# Patient Record
Sex: Female | Born: 1935
Health system: Southern US, Community
[De-identification: ages and names within clinical notes are randomized; demographics above are authoritative.]

## PROBLEM LIST (undated history)

## (undated) DIAGNOSIS — J309 Allergic rhinitis, unspecified: Secondary | ICD-10-CM

## (undated) DIAGNOSIS — E7800 Pure hypercholesterolemia, unspecified: Secondary | ICD-10-CM

## (undated) DIAGNOSIS — I2510 Atherosclerotic heart disease of native coronary artery without angina pectoris: Secondary | ICD-10-CM

## (undated) DIAGNOSIS — Z889 Allergy status to unspecified drugs, medicaments and biological substances status: Secondary | ICD-10-CM

## (undated) DIAGNOSIS — M1990 Unspecified osteoarthritis, unspecified site: Secondary | ICD-10-CM

## (undated) DIAGNOSIS — M419 Scoliosis, unspecified: Secondary | ICD-10-CM

## (undated) DIAGNOSIS — E785 Hyperlipidemia, unspecified: Secondary | ICD-10-CM

## (undated) DIAGNOSIS — IMO0001 Reserved for inherently not codable concepts without codable children: Secondary | ICD-10-CM

## (undated) DIAGNOSIS — E242 Drug-induced Cushing's syndrome: Secondary | ICD-10-CM

## (undated) DIAGNOSIS — I10 Essential (primary) hypertension: Secondary | ICD-10-CM

## (undated) HISTORY — DX: Atherosclerotic heart disease of native coronary artery without angina pectoris: I25.10

## (undated) HISTORY — DX: Reserved for inherently not codable concepts without codable children: IMO0001

## (undated) HISTORY — DX: Allergic rhinitis, unspecified: J30.9

## (undated) HISTORY — PX: ABDOMINAL HYSTERECTOMY: SHX81

## (undated) HISTORY — DX: Hyperlipidemia, unspecified: E78.5

## (undated) HISTORY — DX: Drug-induced Cushing's syndrome: E24.2

## (undated) HISTORY — DX: Essential (primary) hypertension: I10

## (undated) HISTORY — DX: Pure hypercholesterolemia, unspecified: E78.00

## (undated) HISTORY — DX: Unspecified osteoarthritis, unspecified site: M19.90

## (undated) HISTORY — DX: Allergy status to unspecified drugs, medicaments and biological substances: Z88.9

## (undated) HISTORY — DX: Scoliosis, unspecified: M41.9

---
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
## 1958-09-15 HISTORY — PX: APPENDECTOMY: SHX54

## 1993-09-15 HISTORY — PX: OTHER SURGICAL HISTORY: SHX169

## 1995-09-16 HISTORY — PX: OTHER SURGICAL HISTORY: SHX169

## 1996-09-15 HISTORY — PX: REDUCTION MAMMAPLASTY: SUR839

## 1998-06-21 ENCOUNTER — Ambulatory Visit (HOSPITAL_COMMUNITY): Admission: RE | Admit: 1998-06-21 | Discharge: 1998-06-21 | Payer: Self-pay | Admitting: Neurological Surgery

## 1998-12-10 ENCOUNTER — Encounter: Admission: RE | Admit: 1998-12-10 | Discharge: 1998-12-26 | Payer: Self-pay | Admitting: Neurological Surgery

## 1998-12-19 ENCOUNTER — Ambulatory Visit (HOSPITAL_COMMUNITY): Admission: RE | Admit: 1998-12-19 | Discharge: 1998-12-19 | Payer: Self-pay | Admitting: Pediatrics

## 1998-12-19 ENCOUNTER — Encounter: Payer: Self-pay | Admitting: Neurological Surgery

## 1999-08-24 ENCOUNTER — Ambulatory Visit (HOSPITAL_COMMUNITY): Admission: RE | Admit: 1999-08-24 | Discharge: 1999-08-24 | Payer: Self-pay | Admitting: Neurological Surgery

## 1999-08-26 ENCOUNTER — Encounter: Payer: Self-pay | Admitting: Neurological Surgery

## 1999-12-04 ENCOUNTER — Encounter: Admission: RE | Admit: 1999-12-04 | Discharge: 2000-03-03 | Payer: Self-pay | Admitting: Anesthesiology

## 2000-03-30 ENCOUNTER — Encounter: Admission: RE | Admit: 2000-03-30 | Discharge: 2000-06-28 | Payer: Self-pay | Admitting: Anesthesiology

## 2000-08-12 ENCOUNTER — Encounter: Admission: RE | Admit: 2000-08-12 | Discharge: 2000-11-10 | Payer: Self-pay | Admitting: Anesthesiology

## 2000-09-01 ENCOUNTER — Encounter: Payer: Self-pay | Admitting: Neurological Surgery

## 2000-09-01 ENCOUNTER — Ambulatory Visit (HOSPITAL_COMMUNITY): Admission: RE | Admit: 2000-09-01 | Discharge: 2000-09-01 | Payer: Self-pay | Admitting: Neurological Surgery

## 2000-11-19 ENCOUNTER — Encounter: Admission: RE | Admit: 2000-11-19 | Discharge: 2001-02-17 | Payer: Self-pay | Admitting: Anesthesiology

## 2000-12-03 ENCOUNTER — Ambulatory Visit (HOSPITAL_COMMUNITY): Admission: RE | Admit: 2000-12-03 | Discharge: 2000-12-03 | Payer: Self-pay | Admitting: Gastroenterology

## 2001-03-16 ENCOUNTER — Encounter: Admission: RE | Admit: 2001-03-16 | Discharge: 2001-05-15 | Payer: Self-pay | Admitting: Anesthesiology

## 2002-11-04 ENCOUNTER — Emergency Department (HOSPITAL_COMMUNITY): Admission: EM | Admit: 2002-11-04 | Discharge: 2002-11-04 | Payer: Self-pay | Admitting: Emergency Medicine

## 2004-11-21 ENCOUNTER — Inpatient Hospital Stay (HOSPITAL_COMMUNITY): Admission: EM | Admit: 2004-11-21 | Discharge: 2004-11-22 | Payer: Self-pay | Admitting: Emergency Medicine

## 2004-12-17 ENCOUNTER — Encounter: Admission: RE | Admit: 2004-12-17 | Discharge: 2004-12-17 | Payer: Self-pay | Admitting: Cardiology

## 2005-08-11 ENCOUNTER — Emergency Department (HOSPITAL_COMMUNITY): Admission: EM | Admit: 2005-08-11 | Discharge: 2005-08-12 | Payer: Self-pay | Admitting: Emergency Medicine

## 2005-12-31 ENCOUNTER — Encounter: Admission: RE | Admit: 2005-12-31 | Discharge: 2005-12-31 | Payer: Self-pay | Admitting: Cardiology

## 2006-09-15 HISTORY — PX: CORONARY ARTERY BYPASS GRAFT: SHX141

## 2006-09-21 ENCOUNTER — Ambulatory Visit (HOSPITAL_COMMUNITY): Admission: RE | Admit: 2006-09-21 | Discharge: 2006-09-21 | Payer: Self-pay | Admitting: Cardiovascular Disease

## 2006-11-14 HISTORY — PX: CORONARY ANGIOPLASTY: SHX604

## 2006-11-14 HISTORY — PX: CARDIAC CATHETERIZATION: SHX172

## 2007-01-07 ENCOUNTER — Encounter (HOSPITAL_COMMUNITY): Admission: RE | Admit: 2007-01-07 | Discharge: 2007-04-07 | Payer: Self-pay | Admitting: Cardiology

## 2007-08-11 ENCOUNTER — Encounter: Admission: RE | Admit: 2007-08-11 | Discharge: 2007-08-11 | Payer: Self-pay | Admitting: Cardiology

## 2008-02-14 ENCOUNTER — Encounter: Admission: RE | Admit: 2008-02-14 | Discharge: 2008-02-14 | Payer: Self-pay | Admitting: Orthopedic Surgery

## 2008-04-26 ENCOUNTER — Encounter: Admission: RE | Admit: 2008-04-26 | Discharge: 2008-04-26 | Payer: Self-pay | Admitting: Orthopaedic Surgery

## 2008-09-20 ENCOUNTER — Encounter: Admission: RE | Admit: 2008-09-20 | Discharge: 2008-09-20 | Payer: Self-pay | Admitting: Cardiology

## 2008-09-22 ENCOUNTER — Encounter: Admission: RE | Admit: 2008-09-22 | Discharge: 2008-09-22 | Payer: Self-pay | Admitting: Cardiology

## 2009-03-27 ENCOUNTER — Encounter: Admission: RE | Admit: 2009-03-27 | Discharge: 2009-03-27 | Payer: Self-pay | Admitting: Cardiology

## 2009-03-28 ENCOUNTER — Encounter: Admission: RE | Admit: 2009-03-28 | Discharge: 2009-03-28 | Payer: Self-pay | Admitting: Anesthesiology

## 2009-06-22 ENCOUNTER — Encounter: Admission: RE | Admit: 2009-06-22 | Discharge: 2009-06-22 | Payer: Self-pay | Admitting: Cardiology

## 2009-06-27 ENCOUNTER — Encounter: Admission: RE | Admit: 2009-06-27 | Discharge: 2009-06-27 | Payer: Self-pay | Admitting: Cardiology

## 2009-09-15 HISTORY — PX: CHOLECYSTECTOMY: SHX55

## 2010-03-14 ENCOUNTER — Encounter: Admission: RE | Admit: 2010-03-14 | Discharge: 2010-03-14 | Payer: Self-pay | Admitting: Cardiology

## 2010-05-02 ENCOUNTER — Encounter: Admission: RE | Admit: 2010-05-02 | Discharge: 2010-05-02 | Payer: Self-pay | Admitting: Gastroenterology

## 2010-05-27 ENCOUNTER — Encounter (INDEPENDENT_AMBULATORY_CARE_PROVIDER_SITE_OTHER): Payer: Self-pay | Admitting: General Surgery

## 2010-05-27 ENCOUNTER — Observation Stay (HOSPITAL_COMMUNITY): Admission: RE | Admit: 2010-05-27 | Discharge: 2010-05-28 | Payer: Self-pay | Admitting: General Surgery

## 2010-05-27 HISTORY — PX: CHOLECYSTECTOMY, LAPAROSCOPIC: SHX56

## 2010-06-07 ENCOUNTER — Emergency Department (HOSPITAL_COMMUNITY): Admission: EM | Admit: 2010-06-07 | Discharge: 2010-06-07 | Payer: Self-pay | Admitting: Emergency Medicine

## 2010-07-10 ENCOUNTER — Ambulatory Visit: Payer: Self-pay | Admitting: Cardiovascular Disease

## 2010-07-15 ENCOUNTER — Ambulatory Visit: Payer: Self-pay | Admitting: Cardiology

## 2010-09-15 HISTORY — PX: TOTAL KNEE ARTHROPLASTY: SHX125

## 2010-09-17 ENCOUNTER — Telehealth (INDEPENDENT_AMBULATORY_CARE_PROVIDER_SITE_OTHER): Payer: Self-pay | Admitting: *Deleted

## 2010-09-18 ENCOUNTER — Encounter: Payer: Self-pay | Admitting: Cardiology

## 2010-09-18 ENCOUNTER — Encounter (HOSPITAL_COMMUNITY)
Admission: RE | Admit: 2010-09-18 | Discharge: 2010-10-15 | Payer: Self-pay | Source: Home / Self Care | Attending: Cardiology | Admitting: Cardiology

## 2010-09-18 ENCOUNTER — Ambulatory Visit: Admission: RE | Admit: 2010-09-18 | Discharge: 2010-09-18 | Payer: Self-pay | Source: Home / Self Care

## 2010-09-18 ENCOUNTER — Encounter: Payer: Self-pay | Admitting: *Deleted

## 2010-09-19 HISTORY — PX: CARDIOVASCULAR STRESS TEST: SHX262

## 2010-10-06 ENCOUNTER — Encounter: Payer: Self-pay | Admitting: Cardiology

## 2010-10-09 ENCOUNTER — Other Ambulatory Visit: Payer: Self-pay | Admitting: Orthopedic Surgery

## 2010-10-09 LAB — PROTIME-INR
INR: 0.97 (ref 0.00–1.49)
Prothrombin Time: 13.1 seconds (ref 11.6–15.2)

## 2010-10-09 LAB — URINALYSIS, ROUTINE W REFLEX MICROSCOPIC
Ketones, ur: NEGATIVE mg/dL
Nitrite: NEGATIVE
Specific Gravity, Urine: 1.009 (ref 1.005–1.030)
pH: 7.5 (ref 5.0–8.0)

## 2010-10-09 LAB — COMPREHENSIVE METABOLIC PANEL
ALT: 13 U/L (ref 0–35)
AST: 14 U/L (ref 0–37)
Albumin: 3.2 g/dL — ABNORMAL LOW (ref 3.5–5.2)
CO2: 29 mEq/L (ref 19–32)
Chloride: 93 mEq/L — ABNORMAL LOW (ref 96–112)
GFR calc non Af Amer: >60 mL/min (ref >60)
Glucose, Bld: 140 mg/dL — ABNORMAL HIGH (ref 70–99)
Total Bilirubin: 0.5 mg/dL (ref 0.3–1.2)
Total Protein: 6.9 g/dL (ref 6.0–8.3)

## 2010-10-09 LAB — CBC
MCH: 30.6 pg (ref 26.0–34.0)
RDW: 14.1 % (ref 11.5–15.5)

## 2010-10-09 LAB — SURGICAL PCR SCREEN
MRSA, PCR: NEGATIVE
Staphylococcus aureus: NEGATIVE

## 2010-10-09 LAB — APTT: aPTT: 28 seconds (ref 24–37)

## 2010-10-14 ENCOUNTER — Inpatient Hospital Stay (HOSPITAL_COMMUNITY)
Admission: RE | Admit: 2010-10-14 | Discharge: 2010-10-17 | DRG: 470 | Disposition: A | Discharge status: Home / Self Care | Payer: Medicare Other | Attending: Orthopedic Surgery | Admitting: Orthopedic Surgery

## 2010-10-14 DIAGNOSIS — IMO0002 Reserved for concepts with insufficient information to code with codable children: Secondary | ICD-10-CM

## 2010-10-14 DIAGNOSIS — M4120 Other idiopathic scoliosis, site unspecified: Secondary | ICD-10-CM | POA: Diagnosis present

## 2010-10-14 DIAGNOSIS — L508 Other urticaria: Secondary | ICD-10-CM | POA: Diagnosis not present

## 2010-10-14 DIAGNOSIS — T380X5A Adverse effect of glucocorticoids and synthetic analogues, initial encounter: Secondary | ICD-10-CM | POA: Diagnosis present

## 2010-10-14 DIAGNOSIS — I10 Essential (primary) hypertension: Secondary | ICD-10-CM | POA: Diagnosis present

## 2010-10-14 DIAGNOSIS — L299 Pruritus, unspecified: Secondary | ICD-10-CM | POA: Diagnosis not present

## 2010-10-14 DIAGNOSIS — E871 Hypo-osmolality and hyponatremia: Secondary | ICD-10-CM | POA: Diagnosis present

## 2010-10-14 DIAGNOSIS — E669 Obesity, unspecified: Secondary | ICD-10-CM | POA: Diagnosis present

## 2010-10-14 DIAGNOSIS — D72829 Elevated white blood cell count, unspecified: Secondary | ICD-10-CM | POA: Diagnosis present

## 2010-10-14 DIAGNOSIS — Z951 Presence of aortocoronary bypass graft: Secondary | ICD-10-CM

## 2010-10-14 DIAGNOSIS — I2510 Atherosclerotic heart disease of native coronary artery without angina pectoris: Secondary | ICD-10-CM | POA: Diagnosis present

## 2010-10-14 DIAGNOSIS — R7309 Other abnormal glucose: Secondary | ICD-10-CM | POA: Diagnosis present

## 2010-10-14 DIAGNOSIS — E876 Hypokalemia: Secondary | ICD-10-CM | POA: Diagnosis not present

## 2010-10-14 DIAGNOSIS — M1710 Unilateral primary osteoarthritis, unspecified knee: Principal | ICD-10-CM | POA: Diagnosis present

## 2010-10-14 DIAGNOSIS — K449 Diaphragmatic hernia without obstruction or gangrene: Secondary | ICD-10-CM | POA: Diagnosis present

## 2010-10-14 DIAGNOSIS — E2749 Other adrenocortical insufficiency: Secondary | ICD-10-CM | POA: Diagnosis present

## 2010-10-14 DIAGNOSIS — E249 Cushing's syndrome, unspecified: Secondary | ICD-10-CM | POA: Diagnosis present

## 2010-10-14 DIAGNOSIS — E785 Hyperlipidemia, unspecified: Secondary | ICD-10-CM | POA: Diagnosis present

## 2010-10-15 LAB — BASIC METABOLIC PANEL
BUN: 9 mg/dL (ref 6–23)
CO2: 29 mEq/L (ref 19–32)
Creatinine, Ser: 0.75 mg/dL (ref 0.4–1.2)
GFR calc Af Amer: >60 mL/min (ref >60)
GFR calc non Af Amer: >60 mL/min (ref >60)

## 2010-10-15 LAB — CBC
MCV: 89.8 fL (ref 78.0–100.0)
RBC: 4 MIL/uL (ref 3.87–5.11)
RDW: 14.1 % (ref 11.5–15.5)
WBC: 17.4 10*3/uL — ABNORMAL HIGH (ref 4.0–10.5)

## 2010-10-15 LAB — PROTIME-INR: INR: 1.03 (ref 0.00–1.49)

## 2010-10-16 LAB — BASIC METABOLIC PANEL
CO2: 27 mEq/L (ref 19–32)
Chloride: 95 mEq/L — ABNORMAL LOW (ref 96–112)
GFR calc Af Amer: >60 mL/min (ref >60)
GFR calc non Af Amer: >60 mL/min (ref >60)
Glucose, Bld: 211 mg/dL — ABNORMAL HIGH (ref 70–99)

## 2010-10-16 LAB — C-REACTIVE PROTEIN: CRP: 12.1 mg/dL — ABNORMAL HIGH

## 2010-10-16 LAB — SEDIMENTATION RATE: Sed Rate: 45 mm/hr — ABNORMAL HIGH (ref 0–22)

## 2010-10-16 NOTE — Op Note (Signed)
Vanessa Nielsen, Vanessa Nielsen             ACCOUNT NO.:  [id number]  MEDICAL RECORD NO.:  [id number]          PATIENT TYPE:  INP  LOCATION:  0010                         FACILITY:  [facility]  PHYSICIAN:  Ollen Gross, M.D.    DATE OF BIRTH:  07-16-1934  DATE OF PROCEDURE: DATE OF DISCHARGE:                              OPERATIVE REPORT   PREOPERATIVE DIAGNOSIS:  Osteoarthritis, right knee.  POSTOPERATIVE DIAGNOSIS:  Osteoarthritis, right knee.  PROCEDURE:  Right total knee arthroplasty.  SURGEON:  Ollen Gross, M.D.  ASSISTANT:  Avel Peace, PA-C  ANESTHESIA:  General.  ESTIMATED BLOOD LOSS:  Minimal.  DRAINS:  Hemovac times one.  TOURNIQUET TIME:  34 minutes at 300 mmHg.  COMPLICATIONS:  None.  CONDITION:  Stable to recovery room.  BRIEF CLINICAL NOTE:  Vanessa Nielsen is a 77 year old female with advanced arthritis of the right knee with progressively worsening pain and dysfunction.  She has failed nonoperative management and presents for a right total knee arthroplasty.  PROCEDURE IN DETAIL:  After successful administration of general anesthetic, a tourniquet was placed on her right thigh, and her right lower extremity was prepped and draped in the usual sterile fashion. Extremities wrapped in Esmarch, knee flexed, tourniquet inflated to 300 mmHg.  Midline incision was made with a 10 blade through subcutaneous tissue to the level of the extensor mechanism.  A fresh blade was used to make a medial parapatellar arthrotomy.  Soft tissue on the proximal medial tibia subperiosteally elevated to the joint line with the knife into the semimembranosus bursa with a Cobb elevator.  Soft tissue laterally was elevated with attention being paid to avoiding the patellar tendon on tibial tubercle.  Patella was everted, knee flexed 90 degrees, and ACL and PCL removed.  Drill was used to create a starting hole in the distal femur and the canal was thoroughly irrigated.  The 5- degree right  valgus alignment guide was placed, and a distal femoral cutting block was pinned to remove 10 mm off the distal femur.  Distal femoral resection was made with an oscillating saw.  The tibia subluxed forward, and the menisci are removed.  Extramedullary tibial alignment guide was placed referencing proximally at the medial aspect of the tibial tubercle and distally along the second metatarsal axis and tibial crest.  The block was pinned to remove 2 mm off the more deficient medial side.  Tibial resection was made with an oscillating saw.  Size 2.5 was the most appropriate tibial component, and the proximal tibia was prepared with the modular drill and keel punch for the size 2.5.  Femoral sizing guide was placed.  Size 3 was the most appropriate femur. Rotations marked off the epicondylar axis and confirmed by creating a rectangular flexion gap at 90 degrees.  Block was pinned in this rotation and the anterior-posterior and chamfer cuts were made. Intercondylar blocks placed and that cut was made.  The trial size 3 posterior stabilized femur was placed.  A 10-mm posterior stabilized rotating platform insert trial was placed.  There was a little play with the 10, so I went to 12.5, which allowed for full extension with excellent  varus-valgus and anterior-posterior balance throughout full range of motion.  The patella was everted and thickness measured to be 23 mm.  Freehand resection taking 14 mm, 35 templates placed, lug holes drilled, trial patella was placed and it tracked normally.  Osteophytes were removed off the posterior femur with the trial in place.  All trials were removed, and the cut bone surfaces were prepared with pulsatile lavage.  Cement was mixed, and once ready for implantation, the size 2.5 mobile bearing tibial tray size 3 posterior stabilized femur and 35 patella were cemented into place, and the patella was held with a clamp.  Trial 12.5-mm inserts placed, knee held  in full extension, and all extruded cement removed.  When the cement fully hardened, then the permanent 12.5 mm posterior stabilized rotating platform insert was placed in the tibial tray.  The wound was copiously irrigated with saline solution, and the arthrotomy closed over Hemovac drain with interrupted #1 PDS.  Flexion against gravity was about 135 degrees, and the patella tracked normally.  The tourniquet was released with a total time of 34 minutes.  Subcutaneous was closed with interrupted 2-0 Vicryl and subcuticular with running 4-0 Monocryl. Catheter for Marcaine pain pump was placed, and the pump was initiated. Incisions cleaned and dried, and Steri-Strips and a bulky sterile dressing were applied.  She was then placed into a knee immobilizer, awakened and transferred to recovery in stable condition.     Ollen Gross, M.D.     FA/MEDQ  D:  10/14/2010  T:  10/14/2010  Job:  [id number]  Electronically Signed by Ollen Gross M.D. on 10/16/2010 03:40:33 PM

## 2010-10-16 NOTE — H&P (Signed)
Vanessa Nielsen, Vanessa Nielsen             ACCOUNT NO.:  [id number]  MEDICAL RECORD NO.:  [id number]          PATIENT TYPE:  INP  LOCATION:  NA                           FACILITY:  [facility]  PHYSICIAN:  Ollen Gross, M.D.    DATE OF BIRTH:  May 12, 1934  DATE OF ADMISSION:  10/14/2010 DATE OF DISCHARGE:                             HISTORY & PHYSICAL   CHIEF COMPLAINT:  Right knee pain.  HISTORY OF PRESENT ILLNESS:  The patient is 77 year old female who has been seen and evaluated for her right knee.  She has had multiple problems for ongoing years now.  She is in a complex situation.  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  This a right lower extremity with weightbearing for many years now.  She has a stage now where the knee is hurting so bad that she is unable to walk. She has been wheelchair-bound for quite some time now with increasing pain.  Pain is in the joint and radiates down to the leg.  She is seen in the office where x-rays show advanced arthritis in the patellofemoral compartment.  She is bone-on-bone.  Due to the advanced end-stage arthritis, it is felt she would benefit undergoing surgical intervention.  She has significant back problems and actually responded very well to SI joints from Dr. Noel Gerold a couple years ago, although it is felt lot of her pain is coming from her knee and would benefit from surgery.  Risk and benefits have been discussed with her.  She elects to proceed with surgery.  ALLERGIES:  NO KNOWN DOCUMENTED ALLERGIES.  HOWEVER, THE PATIENT HAS HAD, WITH ANY TYPE OF ANESTHETIC AGENT, DEVELOPED SEVERE REACTION WITH SWELLING IN HER MOUTH AND GI TRACK UPSET WITH RED WELTS IN HER HANDS, ARMS, AND ALSO HYPOTENSION.  SHE HAS NEVER HAD ANY DIFFICULTY BREATHING OR STOPPED BREATHING FROM THIS BUT HAS ALWAYS RESPONDED TO STEROIDS IN HER HISTORY.  SHE DOES HAVE AN ALLERGIC REACTION TO PAPER DOCUMENTING HER SYMPTOMS AND ALSO PREVIOUS  TREATMENTS WHICH SHE HAS RESPONDED TO.  CURRENT MEDICATIONS: 1. Lovaza 1 gram daily. 2. Lopressor 50 mg twice a day. 3. Hyzaar 100/25 daily. 4. Micro-K 10 mEq three times day. 5. Norvasc 5 mg daily. 6. Lipitor 10 mg daily. 7. Duragesic patches change every 2 days. 8. Prednisone 10 mg daily. 9. Valium 5 mg as needed. 10.Benadryl 25 mg as needed. 11.She also takes over-the-counter stool softeners, over-the-counter     Ex-Lax, occasional MiraLax, occasional Gas-X, Align, and low-dose     aspirin.  PAST MEDICAL HISTORY: 1. Hypertension. 2. Hypercholesterolemia. 3. Coronary arterial disease. 4. Iatrogenic Cushing's syndrome. 5. Scoliosis. 6. Left lower extremity incomplete paralysis giving her a left foot     drop, although she does have good quad control and lower extremity     weakness. 7. Hiatal hernia. 8. History of gastritis.  PAST SURGICAL HISTORY: 1. CABG of one vessel in 2008 done at [facility]. 2. She has had no multilevel lumbar fusion for scoliosis deformity     done at [facility] in 1995. 3. She had removal of hardware in 1997 due to loose  screws. 4. She has had multiple right knee surgeries, secondary to an     epiphyseal arrest with staples and then removal.  FAMILY HISTORY:  Colon cancer and hypertension.  SOCIAL HISTORY:  She is a retired Health and safety inspector, married, two children. Denies tobacco products.  No alcohol products.  REVIEW OF SYSTEMS:  GENERAL:  No fevers, chills, night sweats.  NEURO: Left lower extremity incomplete paralysis.  No seizures or syncope. RESPIRATORY:  She has a little shortness of breath on exertion.  No shortness breath at rest.  No productive cough or hemoptysis. CARDIOVASCULAR:  Chest pain and orthopnea ever since her bypass surgery. GI: Occasional constipation and nausea.  No diarrhea.  No vomiting.  GU: No dysuria, hematuria, or discharge.  MUSCULOSKELETAL:  Knee pain.  PHYSICAL EXAMINATION:  VITAL SIGNS:  Pulse 72, respirations 18,  and blood pressure 152/84. GENERAL:  This 77 year old white female, well nourished, well developed, short statured, overweight, accompanied by her husband.  She is wheelchair-bound, excellent historian. HEENT:  Normocephalic, atraumatic.  Pupils are round and reactive.  EOMs intact. NECK:  Supple.  No bruits. CHEST:  Clear.  She does have a scoliotic back. HEART:  Regular rhythm.  No murmur.  S1-S1. ABDOMEN:  Soft, round protuberant.  Bowel sounds present. RECTAL, BREASTS, GENITALIA:  Not done. EXTREMITIES:  Right knee, no effusion, marked crepitus.  Tender more medial than lateral.  IMPRESSION:  Osteoarthritis, right knee.  PLAN:  The patient will admitted to [facility] to undergo right total knee replacement and arthroplasty.  Surgery will be performed by Dr. Ollen Gross.     Alexzandrew L. Julien Girt, P.A.C.   ______________________________ Ollen Gross, M.D.    ALP/MEDQ  D:  10/13/2010  T:  10/13/2010  Job:  [id number]  cc:   Tonita Cong, M.D.  Cassell Clement, M.D. Fax: [phone]  Sharolyn Douglas, M.D. Fax: [phone]  Kathrin Penner. Vear Clock, M.D. Fax: [phone]  Electronically Signed by Patrica Duel P.A.C. on 10/16/2010 03:23:56 PM Electronically Signed by Ollen Gross M.D. on 10/16/2010 03:40:29 PM

## 2010-10-17 LAB — BASIC METABOLIC PANEL
Calcium: 7.8 mg/dL — ABNORMAL LOW (ref 8.4–10.5)
Creatinine, Ser: 0.79 mg/dL (ref 0.4–1.2)
GFR calc Af Amer: >60 mL/min (ref >60)

## 2010-10-17 LAB — CBC
HCT: 30.6 % — ABNORMAL LOW (ref 36.0–46.0)
Platelets: 249 10*3/uL (ref 150–400)
RBC: 3.44 MIL/uL — ABNORMAL LOW (ref 3.87–5.11)
RDW: 14 % (ref 11.5–15.5)
WBC: 19.6 10*3/uL — ABNORMAL HIGH (ref 4.0–10.5)

## 2010-10-17 LAB — PROTIME-INR: INR: 2.03 — ABNORMAL HIGH (ref 0.00–1.49)

## 2010-10-17 NOTE — Assessment & Plan Note (Signed)
Summary: Cardiology Nuclear Testing  Nuclear Med Background Indications for Stress Test: Evaluation for Ischemia, Surgical Clearance, PTCA Patency  Indications Comments: Pending (R) TKR on 10/14/10 with Dr. Ollen Gross  History: Angioplasty, Heart Catheterization, Myocardial Perfusion Study  History Comments: '08 PTCA; '10 EAV:[id number] apical ischemia  Symptoms: Chest Pain, DOE, Rapid HR  Symptoms Comments: Last episode of CP:2 days ago                                                                                                                                                                                                                                                                                                                                       Nuclear Pre-Procedure Cardiac Risk Factors: Hypertension, Lipids, Obesity Caffeine/Decaff Intake: None NPO After: 11:30 PM Lungs: Clear.  O2 Sat 94% on RA. IV 0.9% NS with Angio Cath: 22g     IV Site: R Antecubital IV Started by: Irean Hong, RN Chest Size (in) 40     Cup Size D     Height (in): 59.5 Weight (lb): 193 BMI: 38.47 Tech Comments: Last dose metoprolol 3 pm yesterday.  Nuclear Med Study 1 or 2 day study:  1 day     Stress Test Type:  Eugenie Birks Reading MD:  Cassell Clement, MD     Referring MD:  Cassell Clement, MD Resting Radionuclide:  Technetium 25m Tetrofosmin     Resting Radionuclide Dose:  11.0 mCi  Stress Radionuclide:  Technetium 34m Tetrofosmin     Stress Radionuclide Dose:  33.0 mCi   Stress Protocol      Max HR:  117 bpm     Predicted Max HR:  146 bpm       Percent Max HR:  80.14 % Lexiscan: 0.4 mg   Stress Test Technologist:  [employer], CMA-N     Nuclear Technologist:  Domenic Polite, CNMT  Rest Procedure  Myocardial perfusion imaging  was performed at rest 45 minutes following the intravenous administration of Technetium 64m Tetrofosmin.  Stress Procedure  The patient received IV Lexiscan 0.4  mg over 15-seconds.  Technetium 3m Tetrofosmin injected at 30-seconds.  There were no significant changes with infusion.  There were frequent PVC's with couplets and frequent PAC's, which were noted on baseline EKG.  Quantitative spect images were obtained after a 45 minute delay.  QPS Raw Data Images:  Normal; no motion artifact; normal heart/lung ratio. Stress Images:  There is decreased uptake in the apex. Rest Images:  Normal homogeneous uptake in all areas of the myocardium. Subtraction (SDS):  Reversible apical ischemia. Transient Ischemic Dilatation:  1.18  (Normal <1.22)  Lung/Heart Ratio:  0.22  (Normal <0.45)  Quantitative Gated Spect Images QGS cine images:  Not gated  Findings Low risk nuclear study Evidence for anterior (septal apical) ischemia      Overall Impression  Exercise Capacity: Lexiscan with no exercise. BP Response: Normal blood pressure response. Clinical Symptoms: Nausea.  No chest pain. ECG Impression: No significant ST segment change suggestive of ischemia. Overall Impression: Small area of reversible ischemia at apex Overall Impression Comments: Results are similar to prior study report of 2009 and of 07/04/09.  Further workup will be discussed with the patient.  Appended Document: Cardiology Nuclear Testing copy sent to Dr. Patty Sermons

## 2010-10-17 NOTE — Progress Notes (Signed)
Summary: nuc pre procedure  Phone Note Outgoing Call Call back at Home Phone [phone]   Call placed by: Cathlyn Parsons RN,  September 17, 2010 2:31 PM Call placed to: Patient Reason for Call: Confirm/change Appt Summary of Call: Left message with information on Myoview Information Sheet (see scanned document for details).      Nuclear Med Background Indications for Stress Test: Evaluation for Ischemia, Surgical Clearance  Indications Comments: Pre op for Knee Replacement 10/14/10 with Dr. Despina Hick  History: Angioplasty, CABG, Heart Catheterization, Myocardial Perfusion Study  History Comments: 08 Cath with CABG 10/10 MPS-Abn and apical ischemia     Nuclear Pre-Procedure Cardiac Risk Factors: Hypertension, Lipids

## 2010-10-25 ENCOUNTER — Ambulatory Visit: Payer: Self-pay | Admitting: Internal Medicine

## 2010-10-31 ENCOUNTER — Ambulatory Visit (INDEPENDENT_AMBULATORY_CARE_PROVIDER_SITE_OTHER): Payer: Medicare Other | Admitting: Cardiology

## 2010-10-31 DIAGNOSIS — I491 Atrial premature depolarization: Secondary | ICD-10-CM

## 2010-10-31 DIAGNOSIS — I119 Hypertensive heart disease without heart failure: Secondary | ICD-10-CM

## 2010-10-31 DIAGNOSIS — Z79899 Other long term (current) drug therapy: Secondary | ICD-10-CM

## 2010-10-31 DIAGNOSIS — I2510 Atherosclerotic heart disease of native coronary artery without angina pectoris: Secondary | ICD-10-CM

## 2010-11-15 ENCOUNTER — Ambulatory Visit: Payer: Self-pay | Admitting: Cardiology

## 2010-11-28 LAB — URINE CULTURE
Colony Count: NO GROWTH
Culture  Setup Time: 201109231716
Culture: NO GROWTH

## 2010-11-28 LAB — COMPREHENSIVE METABOLIC PANEL
AST: 22 U/L (ref 0–37)
AST: 20 U/L (ref 0–37)
Albumin: 3.1 g/dL — ABNORMAL LOW (ref 3.5–5.2)
Albumin: 3.4 g/dL — ABNORMAL LOW (ref 3.5–5.2)
Alkaline Phosphatase: 55 U/L (ref 39–117)
Calcium: 8.9 mg/dL (ref 8.4–10.5)
Chloride: 93 mEq/L — ABNORMAL LOW (ref 96–112)
Creatinine, Ser: 1.06 mg/dL (ref 0.4–1.2)
Creatinine, Ser: 0.75 mg/dL (ref 0.4–1.2)
GFR calc Af Amer: >60 mL/min (ref >60)
GFR calc Af Amer: >60 mL/min (ref >60)
Potassium: 2.8 mEq/L — ABNORMAL LOW (ref 3.5–5.1)
Sodium: 133 mEq/L — ABNORMAL LOW (ref 135–145)
Total Bilirubin: 0.7 mg/dL (ref 0.3–1.2)
Total Protein: 6.8 g/dL (ref 6.0–8.3)

## 2010-11-28 LAB — DIFFERENTIAL
Basophils Absolute: 0 10*3/uL (ref 0.0–0.1)
Basophils Relative: 0 % (ref 0–1)
Eosinophils Absolute: 0 K/uL (ref 0.0–0.7)
Eosinophils Relative: 0 % (ref 0–5)
Eosinophils Relative: 1 % (ref 0–5)
Lymphocytes Relative: 9 % — ABNORMAL LOW (ref 12–46)
Lymphocytes Relative: 11 % — ABNORMAL LOW (ref 12–46)
Lymphs Abs: 1.3 K/uL (ref 0.7–4.0)
Lymphs Abs: 1.3 10*3/uL (ref 0.7–4.0)
Monocytes Absolute: 1 10*3/uL (ref 0.1–1.0)
Monocytes Absolute: 0.8 10*3/uL (ref 0.1–1.0)
Monocytes Relative: 7 % (ref 3–12)
Neutro Abs: 11.8 K/uL — ABNORMAL HIGH (ref 1.7–7.7)
Neutro Abs: 9.7 10*3/uL — ABNORMAL HIGH (ref 1.7–7.7)
Neutrophils Relative %: 84 % — ABNORMAL HIGH (ref 43–77)

## 2010-11-28 LAB — BASIC METABOLIC PANEL
BUN: 9 mg/dL (ref 6–23)
CO2: 28 mEq/L (ref 19–32)
Chloride: 93 mEq/L — ABNORMAL LOW (ref 96–112)
Creatinine, Ser: 0.82 mg/dL (ref 0.4–1.2)
GFR calc Af Amer: >60 mL/min (ref >60)
Potassium: 2.7 mEq/L — CL (ref 3.5–5.1)

## 2010-11-28 LAB — URINALYSIS, ROUTINE W REFLEX MICROSCOPIC
Glucose, UA: NEGATIVE mg/dL
Hgb urine dipstick: NEGATIVE
Nitrite: NEGATIVE
Protein, ur: NEGATIVE mg/dL
Specific Gravity, Urine: 1.014 (ref 1.005–1.030)
Urobilinogen, UA: 0.2 mg/dL (ref 0.0–1.0)
pH: 7 (ref 5.0–8.0)

## 2010-11-28 LAB — CBC
HCT: 43 % (ref 36.0–46.0)
Hemoglobin: 14.8 g/dL (ref 12.0–15.0)
MCH: 31.2 pg (ref 26.0–34.0)
MCH: 31.6 pg (ref 26.0–34.0)
MCHC: 34.4 g/dL (ref 30.0–36.0)
MCHC: 34.1 g/dL (ref 30.0–36.0)
MCV: 90.8 fL (ref 78.0–100.0)
Platelets: 290 10*3/uL (ref 150–400)
Platelets: 328 10*3/uL (ref 150–400)
RBC: 4.73 MIL/uL (ref 3.87–5.11)
RBC: 4.56 MIL/uL (ref 3.87–5.11)
RDW: 14.3 % (ref 11.5–15.5)
RDW: 13.6 % (ref 11.5–15.5)
WBC: 14.1 10*3/uL — ABNORMAL HIGH (ref 4.0–10.5)

## 2010-11-28 LAB — SURGICAL PCR SCREEN: Staphylococcus aureus: NEGATIVE

## 2010-11-28 LAB — COMPREHENSIVE METABOLIC PANEL WITH GFR
ALT: 15 U/L (ref 0–35)
BUN: 11 mg/dL (ref 6–23)
CO2: 28 meq/L (ref 19–32)
Calcium: 8.5 mg/dL (ref 8.4–10.5)
GFR calc non Af Amer: 51 mL/min — ABNORMAL LOW (ref >60)
Glucose, Bld: 147 mg/dL — ABNORMAL HIGH (ref 70–99)
Total Protein: 6.6 g/dL (ref 6.0–8.3)

## 2010-12-04 NOTE — Consult Note (Signed)
Vanessa Nielsen, Vanessa Nielsen             ACCOUNT NO.:  [id number]  MEDICAL RECORD NO.:  [id number]          PATIENT TYPE:  INP  LOCATION:  1609                         [facility]ACILITY[facility]  [facility]  PHYSICIAN:  Clydia Llano, MD       DATE OF BIRTH:  June 09, 1934  DATE OF CONSULTATION:  10/15/2010 DATE OF DISCHARGE:                                CONSULTATION   PRIMARY CARE PHYSICIAN:  Cassell Clement, M.D.  NEPHROLOGIST:  Tonita Cong, M.D.  ORTHOPEDIST:  Ollen Gross, M.D.  REASON FOR CONSULTATION:  Hives postoperatively.  HISTORY OF PRESENT ILLNESS:  Ms. Depner is a 77 year old Caucasian female with past medical history of coronary artery disease, hypertension, hyperlipidemia and Cushing's type syndrome.  The patient is status post total knee arthroplasty on January 30 by Dr. Lequita Halt and apparently the patient's operative course was uneventful.  However, the patient awoke at approximately 7 o'clock this morning with pruritus and skin welts to her hands and back.  The patient states these symptoms have occurred previously with high stress situations including after coronary bypass surgery in 2008 at [facility] as well as after a common cold. The patient even came to the hospital with a note documenting these problems including symptoms beginning with swelling, pain, and nausea progressing to welts and if uncontrolled to hypotension.  The patient states she did not have these symptoms after cholecystectomy performed in November 2011 as she was treated pre and postoperatively with high- dose IV steroids.  At the time of consultation, the patient is reporting welts bilaterally to the hands as well as midthoracic region down to lower back with significant pruritus.  The patient denies any tongue swelling or posterior pharynx swelling.  She does state that she feels like her "esophagus is swelling."  The patient received 100 mg of IV Solu-Medrol on morning of consultation as well as IV Pepcid  and Benadryl.  However, she states she has had no relief in symptoms.  PAST MEDICAL HISTORY: 1. Two-vessel coronary artery disease status post cath in 2008     revealing stable disease status post 1-vessel CABG in 2008 at     [facility]. 2. Hypertension. 3. Hyperlipidemia. 4. Possible Cushing's syndrome.  The patient states she has not been     officially diagnosed and has recently established care with     Endocrinology. 5. Hiatal hernia. 6. Chronic low back pain with underlying scoliosis.  The patient has     been on chronic prednisone for back pain for approximately 2 years.  PAST SURGICAL HISTORY: 1. Status post laparoscopic cholecystectomy in October 2011 per Dr.     Dwain Sarna. 2. Right total knee arthroplasty on October 14, 2010, per Dr. Lequita Halt.  CURRENT MEDICATIONS: 1. Amlodipine 5 mg p.o. daily. 2. Aspirin 81 mg p.o. daily. 3. Colace 100 mg p.o. b.i.d. 4. Colace 200 mg p.o. q.h.s. 5. HCTZ 25 mg p.o. daily. 6. Cozaar 100 mg p.o. daily. 7. Metoprolol 50 mg p.o. b.i.d. 8. MiraLax 17 g p.o. b.i.d. 9. Zocor 20 mg p.o. daily. 10.Prednisone 10 mg p.o. daily. 11.Coumadin for postop DVT prophylaxis. 12.Fentanyl 12.5 mg, Duragesic. 13.Robaxin 500 mg p.o. q.6 h.  p.r.n. muscle spasm. 14.Morphine 1-2 mg IV q.2 h. p.r.n. severe pain.  ALLERGIES:  No known drug allergies.  FAMILY HISTORY:  Positive for colon cancer and hypertension.  SOCIAL HISTORY:  The patient is married.  She is a retired Health and safety inspector. She denies any tobacco or EtOH use.  REVIEW OF SYSTEMS:  As stated in HPI, otherwise negative.  PHYSICAL EXAMINATION:  VITAL SIGNS:  Blood pressure 157/85, heart rate 74, respirations 16, temperature 98.1, O2 sat is 96% on room air. GENERAL:  This is an obese Caucasian female awake and alert in no acute distress. HEAD:  The patient does exhibit classical cushingoid features with moon face.  Head is atraumatic. EYES:  Extraocular movements intact without scleral icterus  or injection. EARS, NOSE, AND THROAT:  No lip or tongue swelling.  Posterior pharynx is clear without any swelling or exudate.  Mucous membranes are moist. NECK:  Thick, supple without any thyromegaly or lymphadenopathy.  No JVD or carotid bruits. CARDIOVASCULAR:  S1 and S2, regular rate and rhythm.  No murmur, rub, or gallop.  No lower extremity edema. RESPIRATORY:  Lung sounds are clear to auscultation bilaterally.  No wheezes, rales, or crackles.  No increased work of breathing. GI:  Abdomen is obese, soft, nontender, nondistended with positive bowel sounds.  No appreciated masses or splenomegaly. GU:  Foley catheter is draining clear yellow urine. MUSCULOSKELETAL:  Right leg splinting dressings are clean, dry and intact.  2+ dorsalis pedis pulses bilaterally to lower extremities. SKIN:  The patient with urticaria bilaterally to dorsal aspect of hands and fingertips as well as diffuse urticaria at midthoracic region extending to lower back. PSYCHOLOGIC:  The patient is alert and oriented x4 with pleasant mood and affect.  PERTINENT LABORATORY DATA:  White cell count 17.4 up from 16.6 on October 09, 2010, platelet count 290, hemoglobin 12.1, hematocrit 35.9, sodium 130, potassium 3.0, chloride 92, CO2 of 29, BUN 9, creatinine 0.75.  Serum glucose 167.  ASSESSMENT/PLAN: 1. Urticaria.  Unclear etiology.  As mentioned above, the patient     symptoms have occurred postoperatively before.  However, they are     not limited to surgical procedures as the patient states this has     occurred after a common cold or any type of stress.  We will treat     the patient for allergic reaction with intravenous Solu-Medrol to     be tapered accordingly to home dose prednisone.  We will order for     p.r.n. Benadryl for any itching. 2. Hypokalemia.  We will replete p.o. and with intravenous fluids.     Recheck in the morning. 3. Leukocytosis.  Suspect related to chronic steroids and with recent      surgery.  No signs or symptoms of infection as the patient is     afebrile and nontoxic appearing.  We will continue to monitor. 4. Hyperglycemia.  Likely related to current intravenous fluids and     intravenous steroids.  We will change intravenous fluids.  We would     expect continued elevation of glucose with intravenous steroids.     We will monitor. 5. Hyponatremia.  Likely related to adrenal insufficiency in the     setting of chronic steroid use.  The patient's sodium should     improve with treatment with Solu-Medrol.  We will continue to     monitor. 6. Status post total knee arthroplasty.  Continue to manage per     Orthopedics. 7. Probable Cushing's syndrome.  Outpatient followup with     Endocrinology as previously scheduled.     Cordelia Pen, NP   ______________________________ Clydia Llano, MD    LE/MEDQ  D:  10/15/2010  T:  10/15/2010  Job:  [id number]  cc:   Cassell Clement, M.D. Fax: [phone]  Tonita Cong, M.D.  Ollen Gross, M.D. Fax: [phone]  Electronically Signed by Cordelia Pen NP on 11/01/2010 12:12:26 PM Electronically Signed by Clydia Llano  on 12/04/2010 07:44:22 PM

## 2010-12-04 NOTE — Discharge Summary (Signed)
NAMEDIERRA, Vanessa Nielsen             ACCOUNT NO.:  [id number]  MEDICAL RECORD NO.:  [id number]           PATIENT TYPE:  I  LOCATION:  1609                         FACILITY:  [facility]  PHYSICIAN:  Ollen Gross, M.D.    DATE OF BIRTH:  [birth year]  DATE OF ADMISSION:  10/14/2010 DATE OF DISCHARGE:  10/17/2010                              DISCHARGE SUMMARY   ADMITTING DIAGNOSES: 1. Osteoarthritis, right knee. 2. Hypertension. 3. Hypercholesterolemia. 4. Coronary artery disease. 5. Suspected iatrogenic Cushing syndrome. 6. Scoliosis. 7. Left lower extremity and complete paralysis, left leg, left foot. 8. Hiatal hernia. 9. Past history of gastritis.  DISCHARGE DIAGNOSES: 1. Osteoarthritis, right knee, status post right total knee     replacement arthroplasty. 2. Postop hyponatremia, improved. 3. Postop hypokalemia, improved. 4. Urticaria, etiology unknown. 5. Hypertension. 6. Hypercholesterolemia. 7. Coronary artery disease. 8. Suspected iatrogenic Cushing syndrome. 9. Scoliosis. 10.Left lower extremity and complete paralysis, left leg, left foot. 11.Hiatal hernia. 12.Past history of gastritis.  PROCEDURE:  October 17, 2010, right total knee.  SURGEON:  Ollen Gross, M.D.  ASSISTANT:  Alexzandrew L. Perkins, P.A.C.  ANESTHESIA:  General.  TOURNIQUET TIME:  34 minutes.  CONSULTS:  Medicine services, [facility].  BRIEF HISTORY:  The patient is a 77 year old female with advanced arthritis of the right knee, progressive worsening pain and dysfunction, failed nonoperative management, now presents for total knee arthroplasty.  LABORATORY DATA:  CBC on admission showed a hemoglobin of 14.6, hematocrit of 42.4, white cell count 16.6, platelets 312.  Postop hemoglobin 12.1.  Last noted hemoglobin 10.4 and hematocrit 30.6.  Sed rate postop on October 16, 2010, elevated at 45.  PT/INR 13.1/0.97 with a PTT of 28 on admission.  Serial prothrombin times followed  per Coumadin protocol.  Last noted PT 23.1 and INR 2.03. Chem panel on admission low albumin of 3.2, elevated glucose of 140, slightly low sodium of 133.  Serial BMETs were followed for 3 days.  Sodium dropped down to 130, came back up to 132.  Glucose went up to 211, back down to 197.  Potassium dropped from 3.5 to 3.0, back to 4.4.  C-reactive protein high at a level of 12.1, blood group type A positive.  HOSPITAL COURSE:  The patient admitted to [facility], taken to OR, underwent the above-stated procedure without complication.  The patient tolerated the procedure well, later transferred to recovery room and then to orthopedic floor, started on p.o. and IV analgesics, given 24 hours postop IV antibiotics.  She had this Cushing's like syndrome and had been on dependent chronic steroids, so she was given stress doses of steroids in and around her surgery.  On the morning of day #1, though, she started developing some red welts on her torso and itching with urticaria noted.  She was given IV Pepcid, IV Benadryl, and IV Solu- Medrol to continue the IV dosages.  She had had some issues around anesthesia in the past, so we went ahead and called medical consult. There was a question whether she had some adrenal insufficiency.  She also had electrolyte imbalance and she was put on potassium.  She was  seen by Medicine and felt they continued with the IV Solu-Medrol and the Benadryl and just to watch the urticaria and put her on tapering doses of steroids.  By day #2, she was doing a little bit better, so the urticaria had improved.  Dressing change incision looked good.  Her sodium had dropped a little bit further down to 130, but her potassium was improved up to 3.5.  Medicine continued to follow her.  They felt since she was improving that she would be okay to go home with the prednisone tapering pack, she can be work with therapy on day #1, day #2, dressing change on day #2.  By  day #3, she was progressing well, continued on the prednisone, her sodium was better, potassium was back to normal.  She was meeting her goals with therapy and discharged home later that day.  DISCHARGE PLAN: 1. The patient was discharged to home on October 17, 2010. 2. Discharge diagnoses, please see above. 3. Discharge medications, Percocet, Robaxin, Coumadin, and prednisone     taper.  Continue her aspirin, her Colace, Duragesic patch, ex-lax,     Gas-X, Hyzaar, Lipitor, Lopressor, MiraLax, Norvasc, potassium     chloride, promethazine.  DIET:  Heart-healthy diet.  ACTIVITY:  She is weightbearing as tolerated, total knee protocol, home health PT, home health nursing.  FOLLOWUP:  In 2 weeks.  DISPOSITION:  Home.  CONDITION ON DISCHARGE:  Improved.     Alexzandrew L. Julien Girt, P.A.C.   ______________________________ Ollen Gross, M.D.    ALP/MEDQ  D:  11/21/2010  T:  11/22/2010  Job:  [id number]  cc:   Tonita Cong, M.D. Fax: [phone]  Cassell Clement, M.D. Fax: [phone]  Sharolyn Douglas, M.D. Fax: [phone]  Kathrin Penner. Vear Clock, M.D. Fax: [phone]  Electronically Signed by Patrica Duel P.A.C. on 11/22/2010 10:07:35 AM Electronically Signed by Ollen Gross M.D. on 12/04/2010 08:16:17 AM

## 2010-12-16 ENCOUNTER — Other Ambulatory Visit: Payer: Self-pay | Admitting: Orthopaedic Surgery

## 2010-12-16 DIAGNOSIS — M545 Low back pain: Secondary | ICD-10-CM

## 2010-12-18 ENCOUNTER — Ambulatory Visit
Admission: RE | Admit: 2010-12-18 | Discharge: 2010-12-18 | Disposition: A | Discharge status: Home / Self Care | Payer: Medicare Other | Source: Ambulatory Visit | Attending: Orthopaedic Surgery | Admitting: Orthopaedic Surgery

## 2010-12-18 ENCOUNTER — Telehealth: Payer: Self-pay | Admitting: Cardiology

## 2010-12-18 DIAGNOSIS — M545 Low back pain: Secondary | ICD-10-CM

## 2010-12-18 NOTE — Telephone Encounter (Signed)
No pain in ear.  Worse sitting to standing.  Really bad in the middle of night when has to get up and go to rest room.  Husband had a few years ago and was inner ear, something given to him and helped.  When patient had problems with vertigo in past only lasted about a day and meclizine did help.  Meclizine she has is from 2008 (rx says discard after 06/23/08).  Please advise. [facility] [phone]

## 2010-12-18 NOTE — Telephone Encounter (Signed)
I would suggest that she refill her meclizine and have on hand for p.r.n. Use for vertigo

## 2010-12-18 NOTE — Telephone Encounter (Signed)
HAS DEVELOPED A CASE OF VERTIGO. HAS MECLIZNE THAT SHE HAD FROM PREVIOUS AND IT STILL IS NOT ANY BETTER. MAY NEED SOMETHING TO HELP WITH IT AND BLOOD PRESSURE IS UP.

## 2010-12-18 NOTE — Telephone Encounter (Signed)
PT HAS DEVELOPED VERTIGO, IS HAVING BALANCE ISSUES, SHE HAD MECLAZINE, IT IS NOT HELPING, IS THERE ANYTHING ELSE THEY CAN DO?  COULD IT BE INNER EAR?

## 2010-12-19 MED ORDER — MECLIZINE HCL 25 MG PO TABS: 25.0000 mg | ORAL_TABLET | Freq: Three times a day (TID) | ORAL | Status: AC | PRN

## 2010-12-19 NOTE — Telephone Encounter (Signed)
Advised patient to try a new rx of meclizine since hers is expired.  Also, after speaking with patient she stated her blood pressure had been elevated and heart rate in 70's.  Blood pressure mostly elevated with exertion, however after discussing with Dr. Patty Sermons will increase her lopressor to TID.  Did advise to call back if systolic 100 or less .

## 2010-12-19 NOTE — Telephone Encounter (Signed)
Agree with plan 

## 2010-12-25 ENCOUNTER — Telehealth: Payer: Self-pay | Admitting: *Deleted

## 2010-12-25 NOTE — Telephone Encounter (Signed)
Pt calling stating she is still having problems with vertigo; states she has been on Meclizine daily and states this has not helped.  RN spoke with Dr. Patty Sermons and he suggested for her to see ENT; pt is agreeable to this;  [facility] ENT and they need an OV note faxed over before an appt. Can be made stating she has been treated with Meclizine and has been evaluated.

## 2011-01-02 ENCOUNTER — Other Ambulatory Visit: Payer: Self-pay | Admitting: Cardiology

## 2011-01-02 DIAGNOSIS — I10 Essential (primary) hypertension: Secondary | ICD-10-CM

## 2011-01-03 NOTE — Telephone Encounter (Signed)
escribe request  

## 2011-01-06 ENCOUNTER — Other Ambulatory Visit: Payer: Self-pay | Admitting: Cardiology

## 2011-01-06 DIAGNOSIS — I119 Hypertensive heart disease without heart failure: Secondary | ICD-10-CM

## 2011-01-07 NOTE — Telephone Encounter (Signed)
escribe request  

## 2011-01-20 ENCOUNTER — Other Ambulatory Visit: Payer: Self-pay | Admitting: Cardiology

## 2011-01-20 DIAGNOSIS — Z79899 Other long term (current) drug therapy: Secondary | ICD-10-CM

## 2011-01-20 DIAGNOSIS — I2510 Atherosclerotic heart disease of native coronary artery without angina pectoris: Secondary | ICD-10-CM

## 2011-01-20 DIAGNOSIS — E7800 Pure hypercholesterolemia, unspecified: Secondary | ICD-10-CM

## 2011-01-22 ENCOUNTER — Telehealth: Payer: Self-pay | Admitting: Cardiology

## 2011-01-22 NOTE — Telephone Encounter (Signed)
Pt said she is having vertigo again Please call her back

## 2011-01-22 NOTE — Telephone Encounter (Signed)
Left message

## 2011-01-27 ENCOUNTER — Telehealth: Payer: Self-pay | Admitting: Cardiology

## 2011-01-27 NOTE — Telephone Encounter (Signed)
PT STILL HAVING ISSUES WITH VERTIGO.

## 2011-01-27 NOTE — Telephone Encounter (Signed)
Should she be referred to ENT or is there something else?

## 2011-01-27 NOTE — Telephone Encounter (Signed)
Agree we should have ENT see her regarding her vertigo which has been so persistent.  Does she have an ENT physician of her choice.?

## 2011-01-27 NOTE — Telephone Encounter (Signed)
Scheduled appointment with Dr. Haroldine Laws for 02/03/11 at 1:00.  Advised patient

## 2011-01-31 NOTE — Consult Note (Signed)
[facility]  Patient:    Vanessa Nielsen, Vanessa Nielsen                    MRN: [id number] Proc. Date: 06/16/00 Adm. Date:  [id number] Attending:  Thyra Breed CC:         Clovis Pu. Patty Sermons, M.D.  Stefani Dama, M.D.   Consultation Report  FOLLOWUP EVALUATION:  The patient comes in for followup evaluation of her chronic low back pain on the basis of lumbar spondylosis and diastematomyelia, as well as scoliosis.  Since her last evaluation, the patient has noted that she did have a positive benefit from the Duragesic with minimal side-effects. Her concerns are whether it might make her blood pressure go up, which I told her was distinctly unusual if it did as it is not typically noted to do this, even intraoperatively, and tends to be used in settings where there is elevated blood pressure.  Her other concern is getting into an exercise regime so that she can get somewhat improved, even on this lower dose of Duragesic. I discussed integrative therapies with her in detail.  She is interested in more of a training program, but I stated we would start there.  EXAMINATION  VITAL SIGNS:  Blood pressure 165/86, heart rate 67, respiratory rate is 14, O2 saturation is 94%, pain level is 1-2/10 and temperature is 96.4.  NOTE:  Her exam is unchanged from previously.  IMPRESSION 1. Low back pain on the basis lumbar spondylosis with scoliosis and    diastematomyelia. 2. Other medical problems per primary care physician.  DISPOSITION 1. Continue on Duragesic 25 mcg every three days. 2. Referral to integrative therapies for physical therapy. 3. Follow up with me in four to eight weeks. DD:  06/16/00 TD:  06/17/00 Job: [id number]

## 2011-01-31 NOTE — H&P (Signed)
NAME:  Vanessa Nielsen, Vanessa Nielsen NO.:  [id number]   MEDICAL RECORD NO.:  [id number]          PATIENT TYPE:  INP   LOCATION:  0101                         FACILITY:  [facility]   PHYSICIAN:  Graylin Shiver, M.D.   DATE OF BIRTH:  Feb 08, 1934   DATE OF ADMISSION:  11/21/2004  DATE OF DISCHARGE:                                HISTORY & PHYSICAL   CHIEF COMPLAINT:  Nausea, vomiting and diarrhea.   HISTORY OF PRESENT ILLNESS:  This patient is a 77 year old female with a  history of chronic back pain secondary to scoliosis and chronic  constipation.  The patient takes chronic pain medications which might be  contributing to her decreased colonic motility and constipation.  She was  recently seen in the office by Dr. Matthias Hughs on October 02, 2004, with  complaints of constipation, bloating and gas.  She was started on Xifaxan  400 mg t.i.d. with the hopes that this would help her gas and bloating, as  it was felt this could be due to bacterial overgrowth.  Last evening at  about 10:00 because of her continued constipation, she took a laxative.  She  went to bed and then around midnight started to have nausea, vomiting and  diarrhea.  This continued and she subsequently came to the emergency room.  While in the emergency room she was noted to have a little blood in her  diarrheal stool.  The patient did have a normal colonoscopy in 2002, by Dr.  Matthias Hughs.   ALLERGIES:  NONE KNOWN.   MEDICATIONS:  1.  Micro-K 10 mEq 2 pills daily.  2.  Lopressor 50 mg 2-1/2 pills daily.  3.  Norvasc 5 mg daily.  4.  Mobic 7.5 mg daily.  5.  Fentanyl patch 25 mcg per hour.  6.  Xifaxan 400 mg t.i.d.  7.  Hyzaar 100-25 1 daily.   MEDICAL PROBLEMS:  1.  Scoliosis.  2.  Low back pain.  3.  History of angioedema in the past.  4.  History of hypertension.   SURGERIES:  1.  C-sections.  2.  Hysterectomy.  3.  Appendectomy.   SOCIAL HISTORY:  She does not smoke or drink alcohol.   SYSTEMS REVIEW:   No complaints of anginal chest pain, shortness of breath,  cough or sputum production.   PHYSICAL:  Temperature 101.6, blood pressure 129/74, pulse 113.  She does  not appear in any acute distress.  She is nonicteric.  HEENT:  Unremarkable.  NECK:  Supple.  No masses, adenopathy, or goiter.  HEART:  Regular rhythm, no murmurs, gallops or rubs.  LUNGS:  Clear.  ABDOMEN:  Bowel sounds normal, it is soft, nontender.  There is no  hepatosplenomegaly.  EXTREMITIES:  The right leg is edematous appearing compared to the left.   LABS:  White blood cell count 15,100, H&H 17.1 and 50.1 respectively,  differential shows a mild left shift, some toxic granulation and some  atypical lymphocytes.  Sodium 133, potassium 2.6, BUN 17, creatinine 0.9,  glucose 54.   Acute abdomen series showed no acute findings.   IMPRESSION:  Probable  gastroenteritis, rule out other possibilities for  nausea, vomiting and diarrhea.   PLAN:  The patient will be admitted to the hospital, she will be given IV  hydration, and antiemetic control with Zofran.  Stool will be checked for  enteric pathogens, ova and parasites, and clostridium difficile toxin.  A  urinalysis and culture will be obtained to look for any evidence of a  urinary tract infection.  I will hold on most of her oral medications at  this time since she is nauseated and vomiting.  The patient will be followed  clinically.      SFG/MEDQ  D:  11/21/2004  T:  11/21/2004  Job:  [id number]   cc:   Cassell Clement, M.D.  [address]  [place]  [place] [address]  Fax: [phone]   Bernette Redbird, M.D.  [address]  [place], [place] [address]  Fax: [phone]

## 2011-01-31 NOTE — Consult Note (Signed)
[facility]  Patient:    Vanessa, Nielsen                    MRN: [id number] Proc. Date: 03/30/00 Adm. Date:  [id number] Disc. Date: [id number] Attending:  Thyra Breed                          Consultation Report  FOLLOW-UP EVALUATION  HISTORY OF PRESENT ILLNESS:  Vanessa Nielsen comes in for follow-up today. They sent me her records from [facility], Dr. Dagoberto Ligas and Dr. Patty Sermons and I have reviewed these. In addition, she comes with photographs of her eruptions. Her eruptions look very classic for angioedema. She describes episodes where her hands swell and she develops these annular urticarial eruptions in the axilla bilaterally without a lot of hives. She developed abdominal bloating and a sense of swelling inside but no respiratory type symptoms. It is not necessarily brought on by any particular exposure but seems to occur whenever she gets a cold and is associated with changes in her blood pressure. She apparently is scheduled to go [place], [place] but not for a year at least. Current pain level is 3/10. She is very limited because she cannot stand up straight and she has pain when she moves. Sitting in a fetal position, taking Vioxx or Darvocet will help to reduce her discomfort.  CURRENT MEDICATIONS:  Lopressor, Norvasc, cozaar, Premarin, Vioxx, Lozol and Darvocet.  PHYSICAL EXAMINATION:  VITAL SIGNS:  Right arm shows blood pressure 197/90, left 165/73, heart rate 69, respiratory rate 16, O2 saturations 95%, pain level is 3/10 and temperature is 97.  We spent most of the time just discussing her current presentation and reviewing her records. She has had a functional C1 esterase and a C1 esterase level drawn and they were both normal but her symptoms are so classic, I advised her I would like to go ahead and check these again. In addition, I ordered serum protein electrophoresis and quantitative IgA, IgM and IgG levels to ascertain whether these  are abnormal. She has had skin testing for latex and apparently it was negative. I will contact her when I get the results of these laboratories.  DIAGNOSES:  Scoliosis with lumbar spondylosis and angioedema. DD:  03/30/00 TD:  03/31/00 Job: [id number]

## 2011-01-31 NOTE — H&P (Signed)
[facility]  Patient:    Vanessa Nielsen, Vanessa Nielsen                    MRN: [id number] Adm. Date:  [id number] Attending:  Thyra Breed CC:         Florencia Reasons, M.D.  Stefani Dama, M.D.  Thomas A. Patty Sermons, M.D.  Alfonse Alpers. Dagoberto Ligas, M.D.   History and Physical  FOLLOW-UP EVALUATION:  Vanessa Nielsen comes in for follow-up evaluation of her chronic low back pain on the basis of scoliosis and spondylosis with underlying diastematomyelia.  Since her last evaluation, she has done well on 25 mcg of Duragesic per day but noted that when she went up to 50 mcg, it exacerbated her underlying GI problems, so she has had to go back down on this.  She is somewhat frustrated by the fact that she is not able to tolerate the higher dose with regard to her gastrointestinal tract, since it helps so much more with her back discomfort.  She is interested in using 50 mcg periodically if possible.  Of note, her blood pressure has been much better controlled on regular dosing of opiates.  She continues to see Dr. Matthias Hughs for her GI problems.  She notes through the course of the day that until she gets her lift shoe on, she feels pretty rough, but as soon as shes got that on, when she gets up and going she does fine until she has to do any significantly demanding activities.  Then she has more lower back discomfort.  MEDICATIONS:  Current medications are Lopressor, Norvasc, _____, Premarin, K-Dur, Duragesic, Correctol, docusate, aspirin, and multivitamins, as well as Valium p.r.n. and hydrocodone.  PHYSICAL EXAMINATION:  VITAL SIGNS:  Blood pressure 144/72, heart rate 72, respiratory rate 16, O2 saturation 94%, pain level is 3 out of 10.  MUSCULOSKELETAL/NEUROLOGIC:  Straight leg raise signs are negative.  Deep tendon reflexes were unchanged from previously.  She continues to have scoliosis of her back.  IMPRESSION: 1. Low back pain predominantly on the basis of  scoliosis and lumbar    spondylosis with underlying diastematomyelia. 2. Neck pain on the basis of cervical spondylosis. 3. History of angioedema of undetermined etiology. 4. Hypertension.  DISPOSITION: 1. Continue on the Duragesic but at 25 mcg every three days, #10.  She was    also given 50 mcg to use every sixth day if she feels she can tolerate, #5. 2. Follow up with me in three months.  She is to let me know when she is    running low on her medications.  She requested copies of the labs that we    have drawn, and I went ahead and copied these for her. DD:  12/17/00 TD:  12/17/00 Job: [id number]

## 2011-01-31 NOTE — Cardiovascular Report (Signed)
NAMEARIYEL, JEANGILLES NO.:  [id number]   MEDICAL RECORD NO.:  [id number]          PATIENT TYPE:  OIB   LOCATION:  2899                         FACILITY:  [facility]   PHYSICIAN:  Vesta Mixer, M.D. DATE OF BIRTH:  [birth year]   DATE OF PROCEDURE:  09/21/2006  DATE OF DISCHARGE:                            CARDIAC CATHETERIZATION   Vanessa Nielsen is a 77 year old female with recent onset of chest pain.  She had an abnormal Cardiolite study suggesting severe anterior apical  ischemia.  She is referred for heart catheterization for further  evaluation.   PROCEDURE:  Left heart catheterization with coronary angiography.   The right femoral artery was easily cannulated using a modified  Seldinger technique.   HEMODYNAMIC RESULTS:  LV pressure was 149/3 with an aortic pressure of  149/76.   Angiography, left main.  The left main has minor coronary artery  irregularities.   The left anterior descending artery has a 30-40% proximal stenosis.  This was followed by an occlusion for giving off a large diagonal  branch.  The mid and distal LAD fills via left-to-left and right-to-left  collaterals.   There is a first septal branch which is small to moderate in size.  There is also a first diagonal artery that is moderate in size.  There  is a 60-70% stenosis in the proximal aspect of the first diagonal  vessel.   The left circumflex artery is a moderate to large vessel.  There is  moderate irregularities in the proximal segment.  It gives off a large  obtuse marginal artery.  The circumflex continues around and then has a  long 60% stenosis prior to giving off the terminal posterolateral  branch.  It gives off another posterolateral branch prior to this  stenosis, and this posterolateral branch is unremarkable.   The right coronary artery is large and dominant.  There is a proximal 60-  70% stenosis.  This stenosis did not improve with nitroglycerin.  The  remainder of the right coronary artery is unremarkable and is fairly  smooth and normal.  The posterior descending artery in the  posterolateral segment artery is normal.   The left subclavian artery and the left internal mammary artery are  normal.   The left ventriculogram was performed in a 30 RAO position.  It reveals  overall normal left ventricular systolic function.  There is severe  hypokinesis of the apex.   CONCLUSION:  None.   CONCLUSIONS:  1. Two-vessel coronary artery disease.  Her left anterior descending      artery appears to have an old occlusion.  It does not look      favorable for percutaneous intervention.  We will refer her to      surgery.  She is interesting in exploring the possibility of having      robotic surgery.  We will explore these options with her.           ______________________________  Vesta Mixer, M.D.     PJN/MEDQ  D:  09/21/2006  T:  09/21/2006  Job:  [id number]   cc:  Dr. Eulah Pont - [facility]  Cassell Clement, M.D.

## 2011-01-31 NOTE — H&P (Signed)
[name], BALASH NO.:  [id number]   MEDICAL RECORD NO.:  [id number]            PATIENT TYPE:   LOCATION:                                 FACILITY:   PHYSICIAN:  Vesta Mixer, M.D.      DATE OF BIRTH:   DATE OF ADMISSION:  09/21/2006  DATE OF DISCHARGE:                              HISTORY & PHYSICAL   Ms. Vanessa Nielsen is a middle-aged female with a history of hypertension,  hyperlipidemia.  She is admitted for heart catheterization after having  an abnormal Cardiolite study.   Ms. Vanessa Nielsen is an elderly female with a history of severe labile  hypertension.  She has also had lots of the back problems associated  with a congenital spinal problem.   She recently has been describing some unusual episodes in her chest that  occur with walking that she states that really are not a pain but more  of just an usual sensation.  These episodes typically occur when she is  [place]ing [place] around the airport and are relieved with rest.  They  typically last 5-10 minutes.  She also describes some back pain  associated with this.  She has never had these episodes at rest.   CURRENT MEDICATIONS:  1. Metoprolol 50 mg tablets, 2-1/2 tablets a day.  2. Vitamin E and C three times a week.  3. Multivitamin three times a week.  4. Baby aspirin three times a week.  5. Darvocet as needed.  6. Norvasc 5 mg a day.  7. Duragesic patch 12 mcg every 2 days.  8. Lipitor 10 mg four times a week.  9. Potassium chloride 20 mEq tablets, two a day.  10.Hyzaar 100/25 mg once a day.  11.Mobic 7.5 mg a day.  12.Premarin 0.325 mg a day.  13.Allegra as needed.   ALLERGIES:  SHE IS ALLERGIC TO MULTIPLE MEDICATIONS INCLUDING:  1. ERYTHROMYCIN.  2. TUSSIONEX.  3. CARDURA.  4. TORADOL.  5. BIAXIN.   PAST MEDICAL HISTORY:  1. History of back pains.  She was originally diagnosed as having      congenital spina bifida.  since that time, MRI studies have      revealed that her condition is  not actually spina bifida, but two      separate spinal cords.  2. Hypertension.  3. Abnormal stress test.   SOCIAL HISTORY:  The patient is a nonsmoker.   REVIEW OF SYSTEMS:  Is reviewed and is essentially negative except as  noted in the HPI.   PHYSICAL EXAMINATION:  GENERAL:  She is an elderly female in no acute  distress.  She is alert and oriented x3.  Mood and affect are normal.  VITAL SIGNS:  Her weight is 186, blood pressure is 124/76 with a heart  rate of 74.  HEENT: Reveals 2+ carotids.  She has no bruits, no JVD, no thyromegaly.  LUNGS:  Clear to auscultation.  HEART:  Regular rate.  S1-S2.  ABDOMEN:  Reveals good bowel sounds and is nontender.  EXTREMITIES:  She has no clubbing, cyanosis or edema.  NEUROLOGIC:  Nonfocal.   Her stress Cardiolite study reveals a reversible anterior apical defect.   We have scheduled her for a heart catheterization.  We have discussed  the risks, benefits, and options of heart catheterization.  She  understands and agrees to proceed.   She does have hypercholesterolemia.  Her last LDL was 167.  We may need  to change her to Crestor or perhaps a higher dose of Lipitor.  We will  review this information following the heart catheterization.           ______________________________  Vesta Mixer, M.D.     PJN/MEDQ  D:  09/18/2006  T:  09/18/2006  Job:  [id number]   cc:   Cassell Clement, M.D.

## 2011-01-31 NOTE — H&P (Signed)
[facility]  Patient:    Vanessa Nielsen, Vanessa Nielsen                    MRN: [id number] Adm. Date:  [id number] Attending:  Thyra Breed CC:         Florencia Reasons, M.D.  Stefani Dama, M.D.  Thomas A. Patty Sermons, M.D.  Alfonse Alpers. Dagoberto Ligas, M.D.   History and Physical  FOLLOW-UP EVALUATION  HISTORY:  The patient comes in for follow-up evaluation of her low back pain and SI joint discomfort.  Since her last evaluation, she has had her SI joint injected and she says that the soreness has improved, but she still has a lot of pain.  She continues on the Duragesic 25 mcg every 3-4 days with hydrocodone for breakthrough pain.  She notes that she cannot go shopping without a lot of discomfort.  She noted that sitting will decrease her discomfort, but standing or walking (especially standing) will increase her discomfort.  She has not been to integrated therapies.  She complains of left lumbar soreness, which she states is her most significant pain, and some right shoulder discomfort, which is new.  Her current medications are Lozol, Lopressor, Norvasc, Cozaar, Premarin, K-Dur, and Duragesic with the hydrocodone.  She is in the process of being worked up by Dr. Matthias Hughs for nausea, which she stated is not any worse after her last visit, and she does not feel as though it is related to the Duragesic.  PHYSICAL EXAMINATION:  VITAL SIGNS:  Blood pressure 190/95, heart rate 64, respiratory rate 18, O2 saturations 95%, pain level 2/10.  NEUROLOGIC:  The patients posture is such that she is tilted to the right. She is tender over the lower lumbar facet joints.  Deep tendon reflexes were unchanged from her last visit, 2+ at the knees, ______ at the left ankle and 1+ at the right ankle with negative straight leg raise signs.  IMPRESSION: 1. Low back pain with underlying scoliosis and lumbar spondylosis. 2. Diastomatomyelia. 3. Neck pain on the basis of  degenerative disk disease and facet joint    arthropathy. 4. Angioedema of undetermined etiology. 5. Hypertension per Dr. Dagoberto Ligas.  DISPOSITION: 1. Increase Duragesic to 50 mcg every three days, #10 with no refill. 2. Hydrocodone 5/500, one p.o. q.6h. p.r.n. breakthrough pain, #50 with no    refill. 3. The patient was encouraged to go ahead to integrative therapies for    physical therapy. 4. Follow up with me in eight weeks.  She is aware that she will need to stop    by for another prescription for her Duragesic in the interim. DD:  10/05/00 TD:  10/05/00 Job: [id number]

## 2011-01-31 NOTE — Procedures (Signed)
[place]  Patient:    Vanessa Nielsen, Vanessa Nielsen                    MRN: [id number] Proc. Date: 12/03/00 Adm. Date:  [id number] Attending:  Thyra Breed CC:         Clovis Pu. Patty Sermons, M.D.   Procedure Report  PROCEDURE PERFORMED:  Colonoscopy  ENDOSCOPIST:  Florencia Reasons, M.D.  INDICATIONS FOR PROCEDURE:  The patient is a 77 year old female with a family history of colon cancer.  FINDINGS:  Normal exam.  DESCRIPTION OF PROCEDURE:  The nature, purpose and risks of the procedure were familiar to the patient from prior examination and she provided written consent.  Sedation was fentanyl 75 mcg, Versed 7.5 mg and Phenergan 25 mg IV, with the Phenergan being given because of a history of nausea and vomiting following operative procedures in the past.  We had used Phenergan at the time of her previous colonoscopy five years ago and it worked well.  Indeed, the patient had no report of nausea following this procedure.  The Olympus pediatric colonoscope PCF-140L was inserted and advanced to the cecum, applying a small amount of abdominal compression and then turning the patient into the supine position to reach the base of the cecum.  The pullback of the scope was then accomplished in a gradual fashion.  The quality of the prep was excellent and it is felt that all areas were well seen.  This was a normal examination.  No polyps, cancer, colitis, vascular malformations or diverticulosis were observed and retroflexion in the rectum was normal.  There were moderate internal hemorrhoids observed on pullout through the anal canal.  The patient tolerated the procedure well and there were no apparent complications.  IMPRESSION:  Normal colonoscopy in a patient with a family history of colon cancer.  PLAN: Follow-up colonoscopy in five years.  DD:  12/03/00 TD:  12/03/00 Job: [id number]

## 2011-01-31 NOTE — H&P (Signed)
[facility]  Patient:    Vanessa Nielsen, Vanessa Nielsen                    MRN: [id number] Adm. Date:  [id number] Attending:  Thyra Breed CC:         Florencia Reasons, M.D.  Stefani Dama, M.D.  Thomas A. Patty Sermons, M.D.  Alfonse Alpers. Dagoberto Ligas, M.D.   History and Physical  HISTORY OF PRESENT ILLNESS:  Karene comes in for followup evaluation of her chronic low-back pain syndrome on the basis of scoliosis, spondylosis, and diastematomyelia.  Since her last evaluation, she has continued on the 25 mcg of Duragesic since her last visit.  She continues to have some nausea in the morning but overall she feels as though this allows her to be much more functional.  She continues with physical therapy and sees ______, who has been stretching her back.  She is very worried that she is going to have an exacerbation of her underlying back problems but it sounds as though they are being very cautious with her.  She continues on her current blood pressure regimen and overall has noted that it seems to be better controlled, as long as she stays away from caffeine.  She does note that with the Duragesic she does get some constipation and bloating.  With physical therapy, she notes that her pain is markedly improved for the day she has this but the next day she feels some mild exacerbations of her pain, but overall she feels as though she is improving.  PHYSICAL EXAMINATION:  VITAL SIGNS:  Blood pressure 184/94, heart rate 63, respiratory rate 20.  O2 saturation 96%.  Pain level was 3/10.  NEUROLOGIC:  She continues to require a cane to get about.  Her deep tendon reflexes were 2+ at the knees, mute at the left ankle, and 0-1+ at the right.  CURRENT MEDICATIONS: 1. Lopressor 50 mg 2-1/2 tablets per day. 2. Lozol 1/2 tablet per day. 3. Norvasc 5 mg per day. 4. Cozaar 2 per day. 5. Premarin. 6. Vioxx. 7. Duragesic patch 25 mcg per day. 8. She takes hydrocodone or  Darvocet p.r.n.  IMPRESSION: 1. Low-back pain on the basis of scoliosis and lumbar spondylosis with    underlying diastematomyelia. 2. History of cervical spondylosis. 3. Angioedema of undetermined etiology. 4. Hypertension.  DISPOSITION: 1. Continue on the Duragesic at 25 mcg every three days #10. 2. Continue with physical therapy for the time being, as she does note some    benefits from it.  She was encouraged to be very aggressive about letting    them know in physical therapy if they were hurting her in any way. 3. Follow up with me in three months. DD:  03/22/01 TD:  03/22/01 Job: [id number]

## 2011-01-31 NOTE — H&P (Signed)
[facility]  Patient:    Vanessa Nielsen, Vanessa Nielsen                    MRN: [id number] Adm. Date:  [id number] Attending:  Thyra Breed CC:         Clovis Pu. Patty Sermons, M.D.  Stefani Dama, M.D.   History and Physical  FOLLOWUP EVALUATION  BRIEF HISTORY:  Vanessa Nielsen has noted that the Duragesic patch seems to have taken some of the edge off, but is not affecting her abilities to get up and walk about quite a bit.  She is having increasing problems with her left sacroiliac joint.  She has had this injected in the past by Dr. Barrington Ellison and was asking whether she needed to go back to him.  I advised her that we could do that. She has noted though that her pain is fairly well controlled at rest.  It comes on if she walks around too much and she did take one of her husbands hydrocodone and noted that this helped reduce the discomfort and she did not have any side effects with this.  MEDICATIONS:  Her current medications are little change from previously including Lopressor, Norvasc, Cozaar, Premarin, Vioxx and Lozol in addition to the Duragesic patch.  PHYSICAL EXAMINATION:  VITAL SIGNS:  Blood pressure 180/83, heart rate 66, respiratory rate 12, O2 saturation 94%, pain level 4/10.  NEUROLOGIC:  Straight leg raise signs were negative.  Deep tendon reflexes were unchanged from previously.  She is tender over the left SI joint.  IMPRESSION: 1. Left hip and sacroiliac joint discomfort with low back pain on the basis of    lumbar spondylosis and scoliosis with diastematomyelia. 2. Other medical problems per primary care physician.  DISPOSITION: 1. Continue on the Duragesic patches 25 mcg every third day #10 with no    refill. 2. Hydrocodone 5/500 one p.o. q.6h. p.r.n. breakthrough pain, #50 with no    refill. 3. The patient is asking about an sacroiliac joint injection and I advised her    that we would see how the breakthrough pain medication worked before  proceeding with an sacroiliac joint injection, but we will set her up to    proceed with this in four weeks if she is not improved.  I will plan to see    her back in four weeks. 4. She has asked that we send copies of her laboratory investigations that    were positive such as the elevated sedimentation rate. DD:  08/13/00 TD:  08/13/00 Job: [id number]

## 2011-01-31 NOTE — H&P (Signed)
[facility]  Patient:    Vanessa Nielsen, Vanessa Nielsen                    MRN: [id number] Adm. Date:  [id number] Attending:  Thyra Breed CC:         Clovis Pu. Patty Sermons, M.D.             Stefani Dama, M.D.                         History and Physical  FOLLOW-UP EVALUATION:  [name] comes in for follow-up evaluation of her chronic low back pain on the basis of scoliosis with lumbar spondylosis and diastematomyelia. Since her previous evaluation, the patient has noted ongoing increase in her back discomfort. Our workup to date has really not revealed why she should have as much of a problem with surgery as she has had in the past. Her sed rate was elevated at 43, but her ANA rheumatoid factor and C-reactive protein were all normal at her last visit. Because she is having increased pain which is exacerbated by walking about, I advised her that her options were to consider facet joint nerve blocks with the ultimate goal of potentially lesioning these nerves versus long-term opiates. She expressed an interest in at least trying Duragesic. I reviewed the risks of this medication in great detail and advised her that she would likely have exacerbations of her constipation. Knowing full well that this is a possibility, she still wishes to go ahead and try this medication. I reviewed with her the common side effects of this medication and advised her to let us know if she is having problems. She states she is not allergic to Fentanyl as far as she knows.  PHYSICAL EXAMINATION:  VITAL SIGNS:  Blood pressure 192/92, heart rate was 63, respiratory rate is 14, O2 saturation 95%.  GENERAL:  Her back continues to show scoliosis.  NEUROLOGICAL:  Her deep tendon reflexes were unchanged from previously. She shows atrophy of the left lower extremity relative to the right.  IMPRESSION: 1. Low back pain which is multifactorial on the basis of lumbar spondylosis    with  underlying scoliosis and diastematomyelia. 2. Other medical problems per primary care physician.  DISPOSITION: 1. Trial of Duragesic 25 mcg to be applied every third day, #10 with no    refill. 2. Follow up with me in four weeks. 3. Consider facet joint nerve block if she is not improved. DD:  05/19/00 TD:  05/19/00 Job: [id number]

## 2011-01-31 NOTE — H&P (Signed)
[facility]  Patient:    Vanessa Nielsen, Vanessa Nielsen                      MRN: [id number] Proc. Date: 01/29/00 Attending:  Thyra Breed, M.D. CC:         Clovis Pu. Patty Sermons, M.D.                         History and Physical  ATTENDING:  Thyra Breed, M.D.  HISTORY OF PRESENT ILLNESS:  Vanessa Nielsen comes in for followup and evaluation of her chronic low back pain and underlying scoliosis.  Since her previous evaluation, the patient has continued to be evaluated for her angioedema and had a flare-up of this shortly after being started on Norvasc.  She has had many of her medications tapered down and is currently on Lopressor.  She is off of her glucosamine and her Vioxx.  She is not interested in going on any medications for the time being.  She is really interested in trying to figure out what is going on with the angioedema.  I do not have any information from Dr. Dagoberto Ligas nor Dr. Patty Sermons concerning her workups.  PHYSICAL EXAMINATION:  VITAL SIGNS:  Blood pressure 196/90 in the left arm, and 186/92 in the right arm.  Heart rate was 82.  Respiratory rate 20, O2 saturation 95%.  Pain level was 2 out of 10.  NEURO:  Her deep tendon reflexes were 2+ in the knees, mute in the left ankle, and 1+ in the right ankle.  She has a foot drop on the left side.  She has good range of motion of her hips and knees.  IMPRESSION: 1. Low back pain with underlying scoliosis, history of diastematomyelia. 2. Angioedema of undetermined etiology. 3. Hypertension, per Dr. Patty Sermons.  DISPOSITION:  I advised the patient that before proceeding with any therapy, I needed to go ahead and get her records from the outside.  She is in the process of considering a visit to [place] for a more complete evaluation, and I encouraged her to proceed with these plans.  We will go ahead and request records from Dr. Dagoberto Ligas, Dr. Patty Sermons, and from [facility] as well as from [facility] to see  whether we can get to a closer decision about what is going on with her. DD:  01/29/00 TD:  02/03/00 Job: [id number]

## 2011-02-11 ENCOUNTER — Other Ambulatory Visit: Payer: Self-pay | Admitting: *Deleted

## 2011-02-11 DIAGNOSIS — E7800 Pure hypercholesterolemia, unspecified: Secondary | ICD-10-CM

## 2011-02-12 ENCOUNTER — Other Ambulatory Visit: Payer: Self-pay | Admitting: Cardiology

## 2011-02-12 ENCOUNTER — Other Ambulatory Visit (INDEPENDENT_AMBULATORY_CARE_PROVIDER_SITE_OTHER): Payer: Medicare Other | Admitting: *Deleted

## 2011-02-12 DIAGNOSIS — E7800 Pure hypercholesterolemia, unspecified: Secondary | ICD-10-CM

## 2011-02-12 LAB — BASIC METABOLIC PANEL
BUN: 12 mg/dL (ref 6–23)
CO2: 30 mEq/L (ref 19–32)
GFR: 81.42 mL/min (ref >60.00)
Glucose, Bld: 91 mg/dL (ref 70–99)
Potassium: 3.1 mEq/L — ABNORMAL LOW (ref 3.5–5.1)

## 2011-02-12 LAB — HEPATIC FUNCTION PANEL
AST: 18 U/L (ref 0–37)
Albumin: 3.1 g/dL — ABNORMAL LOW (ref 3.5–5.2)
Total Protein: 6 g/dL (ref 6.0–8.3)

## 2011-02-12 LAB — LIPID PANEL
Cholesterol: 221 mg/dL — ABNORMAL HIGH (ref 0–200)
VLDL: 38.4 mg/dL (ref 0.0–40.0)

## 2011-02-13 ENCOUNTER — Telehealth: Payer: Self-pay | Admitting: *Deleted

## 2011-02-13 NOTE — Telephone Encounter (Signed)
Adv. Patient of lab work and to increase potassium to 2 bid per Dr. Patty Sermons. We will re-check lab on next visit

## 2011-02-21 ENCOUNTER — Encounter: Payer: Self-pay | Admitting: Cardiology

## 2011-02-25 ENCOUNTER — Encounter: Payer: Self-pay | Admitting: Cardiology

## 2011-02-25 ENCOUNTER — Ambulatory Visit (INDEPENDENT_AMBULATORY_CARE_PROVIDER_SITE_OTHER): Payer: Medicare Other | Admitting: Cardiology

## 2011-02-25 DIAGNOSIS — E7800 Pure hypercholesterolemia, unspecified: Secondary | ICD-10-CM

## 2011-02-25 DIAGNOSIS — I259 Chronic ischemic heart disease, unspecified: Secondary | ICD-10-CM

## 2011-02-25 DIAGNOSIS — Q057 Lumbar spina bifida without hydrocephalus: Secondary | ICD-10-CM

## 2011-02-25 DIAGNOSIS — K5900 Constipation, unspecified: Secondary | ICD-10-CM

## 2011-02-25 DIAGNOSIS — I119 Hypertensive heart disease without heart failure: Secondary | ICD-10-CM

## 2011-02-25 DIAGNOSIS — K589 Irritable bowel syndrome without diarrhea: Secondary | ICD-10-CM | POA: Insufficient documentation

## 2011-02-25 NOTE — Assessment & Plan Note (Signed)
The patient is under the care of Dr. Ewing Schlein.  She continues to have a lot of constipation.She takes MiraLax twice a day.  She still has a lot of nausea.

## 2011-02-25 NOTE — Assessment & Plan Note (Signed)
The patient has a past history of ischemic heart disease.  She has not been experiencing any recurrent angina pectoris.  Her last nuclear stress test was on 09/18/10  Which showed low risk nuclear study with evidence for small area of reversible apical ischemia unchanged from 2009 or 2010.  She went on to have successful knee surgery uneventfully from a cardiac standpoint

## 2011-02-25 NOTE — Assessment & Plan Note (Signed)
The patient has had previous back surgery.  She continues to have a lot of pain in her lower back.  She is under the care of Dr. Sharolyn Douglas.

## 2011-02-25 NOTE — Assessment & Plan Note (Signed)
The patient has a past history of severe hypertension.  She has not been having any headaches.  She has had some dizzy spells and occasionally symptoms of orthostatic hypotension when she stands up quickly.  She also has a history of vertigo and this has been evaluated by Dr. Haroldine Laws.  She does find some improvement with meclizine

## 2011-02-25 NOTE — Progress Notes (Signed)
Vanessa Nielsen Date of Birth:  May 12, 1934 [facility] [address]   [address] [place], [place]  [address] [phone]           Fax   [phone]  History of Present Illness: This pleasant 77 year old woman is seen for a scheduled followup office visit.  She has a complex past medical history.  He has a history of essential hypertension and history of hyperlipidemia.  She has known coronary artery disease.  She underwent cardiac catheterization and angioplasty of her LAD in March of 2008 in [place].  She has a history of severe scoliosis and a history of spina bifida.  She's also had significant osteoarthritis of her right knee and earlier this year underwent successful right knee arthroplasty.  She has not been experiencing any recurrent angina pectoris.  She continues to have a lot of problems with GI symptoms.  She also continues to have a lot of low back pain.  Current Outpatient Prescriptions  Medication Sig Dispense Refill  . amLODipine (NORVASC) 5 MG tablet Take 1 tablet (5 mg total) by mouth daily.  180 tablet  2  . aspirin 81 MG EC tablet Take 81 mg by mouth daily.        Marland Kitchen atorvastatin (LIPITOR) 10 MG tablet Take 10 mg by mouth daily.        . diazepam (VALIUM) 5 MG tablet Take 5 mg by mouth as needed.        . docusate sodium (COLACE) 100 MG capsule Take 100 mg by mouth 2 (two) times daily.       . fentaNYL (DURAGESIC - DOSED MCG/HR) 12 MCG/HR Place 1 patch onto the skin every other day.        . losartan-hydrochlorothiazide (HYZAAR) 100-25 MG per tablet TAKE 1 TABLET EVERY DAY  90 tablet  3  . meclizine (ANTIVERT) 25 MG tablet Take 25 mg by mouth as needed.        . metoprolol (LOPRESSOR) 50 MG tablet Take 50 mg by mouth 2 (two) times daily.       . nitroGLYCERIN (NITROSTAT) 0.4 MG SL tablet Place 0.4 mg under the tongue every 5 (five) minutes as needed.        . predniSONE (DELTASONE) 5 MG tablet Take 10 mg by mouth as directed.       .  promethazine (PHENERGAN) 25 MG tablet Take 25 mg by mouth every 6 (six) hours as needed.        . pseudoephedrine (SUDAFED) 120 MG 12 hr tablet Take 60 mg by mouth every 12 (twelve) hours.        Marland Kitchen DISCONTD: methocarbamol (ROBAXIN) 500 MG tablet Take 500 mg by mouth 4 (four) times daily.        Marland Kitchen DISCONTD: oxyCODONE-acetaminophen (PERCOCET) 5-325 MG per tablet Take 1 tablet by mouth every 4 (four) hours as needed.        Marland Kitchen DISCONTD: warfarin (COUMADIN) 5 MG tablet as directed.          Allergies  Allergen Reactions  . Biaxin   . Cardura (Doxazosin Mesylate)   . Erythromycin   . Toradol   . Tussin     Patient Active Problem List  Diagnoses  . Ischemic heart disease  . Benign hypertensive heart disease without heart failure  . Hypercholesterolemia  . Spina bifida of lumbar spine  . Constipation    History  Smoking status  . Never Smoker   Smokeless tobacco  .  Not on file    History  Alcohol Use No    Family History  Problem Relation Age of Onset  . Cancer Father     colon    Review of Systems: Constitutional: no fever chills diaphoresis or fatigue or change in weight.  Head and neck: no hearing loss, no epistaxis, no photophobia or visual disturbance. Respiratory: No cough, shortness of breath or wheezing. Cardiovascular: No chest pain peripheral edema, palpitations. Gastrointestinal: No abdominal distention, no abdominal pain, no change in bowel habits hematochezia or melena. Genitourinary: No dysuria, no frequency, no urgency, no nocturia. Musculoskeletal:No arthralgias, no back pain, no gait disturbance or myalgias. Neurological: No dizziness, no headaches, no numbness, no seizures, no syncope, no weakness, no tremors. Hematologic: No lymphadenopathy, no easy bruising. Psychiatric: No confusion, no hallucinations, no sleep disturbance.    Physical Exam: Filed Vitals:   02/25/11 1444  BP: 144/80  Pulse: 84  On physical examination this is a well-developed  well-nourished woman who arrives in a wheelchair.  She is in no acute distress.Pupils equal and reactive.   Extraocular Movements are full.  There is no scleral icterus.  The mouth and pharynx are normal.  The neck is supple.  The carotids reveal no bruits.  The jugular venous pressure is normal.  The thyroid is not enlarged.  There is no lymphadenopathy.The chest is clear to percussion and auscultation. There are no rales or rhonchi. Expansion of the chest is symmetrical.The precordium is quiet.  The first heart sound is normal.  The second heart sound is physiologically split.  There is no murmur gallop rub or click.  There is no abnormal lift or heave.The abdomen is soft and nontender. Bowel sounds are normal. The liver and spleen are not enlarged. There Are no abdominal masses. There are no bruits.  Extremities show chronic edema of the left lower extremity.  Her back was not examined.  She does have known severe scoliosis.The skin is warm and dry.  There is no rash.   Assessment / Plan: Continue on present medication.  Recheck in 4 months for followup office visit and fasting lab work

## 2011-02-26 ENCOUNTER — Other Ambulatory Visit: Payer: Self-pay | Admitting: *Deleted

## 2011-02-26 DIAGNOSIS — R42 Dizziness and giddiness: Secondary | ICD-10-CM

## 2011-02-26 MED ORDER — MECLIZINE HCL 25 MG PO TABS: 25.0000 mg | ORAL_TABLET | Freq: Two times a day (BID) | ORAL | Status: DC | PRN

## 2011-02-26 NOTE — Telephone Encounter (Signed)
Refilled meds per fax request.  

## 2011-03-07 ENCOUNTER — Other Ambulatory Visit: Payer: Self-pay | Admitting: *Deleted

## 2011-03-07 DIAGNOSIS — Z79899 Other long term (current) drug therapy: Secondary | ICD-10-CM

## 2011-03-07 DIAGNOSIS — E7800 Pure hypercholesterolemia, unspecified: Secondary | ICD-10-CM

## 2011-03-11 ENCOUNTER — Other Ambulatory Visit: Payer: Medicare Other | Admitting: *Deleted

## 2011-03-12 ENCOUNTER — Other Ambulatory Visit: Payer: Self-pay | Admitting: Cardiology

## 2011-03-12 DIAGNOSIS — Z1231 Encounter for screening mammogram for malignant neoplasm of breast: Secondary | ICD-10-CM

## 2011-03-28 ENCOUNTER — Other Ambulatory Visit: Payer: Self-pay | Admitting: Orthopaedic Surgery

## 2011-03-28 DIAGNOSIS — M545 Low back pain: Secondary | ICD-10-CM

## 2011-03-28 DIAGNOSIS — M546 Pain in thoracic spine: Secondary | ICD-10-CM

## 2011-04-02 ENCOUNTER — Other Ambulatory Visit: Payer: Medicare Other

## 2011-04-15 ENCOUNTER — Ambulatory Visit
Admission: RE | Admit: 2011-04-15 | Discharge: 2011-04-15 | Disposition: A | Discharge status: Home / Self Care | Payer: Medicare Other | Source: Ambulatory Visit | Attending: Orthopaedic Surgery | Admitting: Orthopaedic Surgery

## 2011-04-15 DIAGNOSIS — M546 Pain in thoracic spine: Secondary | ICD-10-CM

## 2011-04-15 DIAGNOSIS — M545 Low back pain, unspecified: Secondary | ICD-10-CM

## 2011-04-16 ENCOUNTER — Ambulatory Visit: Payer: Medicare Other

## 2011-04-30 ENCOUNTER — Ambulatory Visit
Admission: RE | Admit: 2011-04-30 | Discharge: 2011-04-30 | Disposition: A | Discharge status: Home / Self Care | Payer: Medicare Other | Source: Ambulatory Visit | Attending: Cardiology | Admitting: Cardiology

## 2011-04-30 DIAGNOSIS — Z1231 Encounter for screening mammogram for malignant neoplasm of breast: Secondary | ICD-10-CM

## 2011-05-08 ENCOUNTER — Other Ambulatory Visit: Payer: Self-pay | Admitting: Cardiology

## 2011-05-16 ENCOUNTER — Other Ambulatory Visit: Payer: Self-pay | Admitting: *Deleted

## 2011-05-16 DIAGNOSIS — E876 Hypokalemia: Secondary | ICD-10-CM

## 2011-05-16 MED ORDER — POTASSIUM CHLORIDE ER 10 MEQ PO TBCR: EXTENDED_RELEASE_TABLET | ORAL | Status: DC

## 2011-05-16 NOTE — Telephone Encounter (Signed)
Received fax from pharmacy requesting K+.  Was not in EMR however was in her paper chart and has been taking for quite a while. Sent refill ok back via fax

## 2011-05-26 ENCOUNTER — Other Ambulatory Visit: Payer: Self-pay | Admitting: *Deleted

## 2011-05-26 DIAGNOSIS — F419 Anxiety disorder, unspecified: Secondary | ICD-10-CM

## 2011-05-26 MED ORDER — DIAZEPAM 5 MG PO TABS: 5.0000 mg | ORAL_TABLET | Freq: Every day | ORAL | Status: DC | PRN

## 2011-05-26 NOTE — Telephone Encounter (Signed)
Refilled meds per fax request. Faxed signed copy back to pharmacy

## 2011-06-30 ENCOUNTER — Encounter: Payer: Self-pay | Admitting: Cardiology

## 2011-06-30 ENCOUNTER — Ambulatory Visit (INDEPENDENT_AMBULATORY_CARE_PROVIDER_SITE_OTHER): Payer: Medicare Other | Admitting: Cardiology

## 2011-06-30 VITALS — BP 138/80 | HR 70 | Ht 59.0 in | Wt 198.0 lb

## 2011-06-30 DIAGNOSIS — R0989 Other specified symptoms and signs involving the circulatory and respiratory systems: Secondary | ICD-10-CM

## 2011-06-30 DIAGNOSIS — I259 Chronic ischemic heart disease, unspecified: Secondary | ICD-10-CM

## 2011-06-30 DIAGNOSIS — I119 Hypertensive heart disease without heart failure: Secondary | ICD-10-CM

## 2011-06-30 DIAGNOSIS — Q057 Lumbar spina bifida without hydrocephalus: Secondary | ICD-10-CM

## 2011-06-30 DIAGNOSIS — R0600 Dyspnea, unspecified: Secondary | ICD-10-CM

## 2011-06-30 DIAGNOSIS — E7800 Pure hypercholesterolemia, unspecified: Secondary | ICD-10-CM

## 2011-06-30 DIAGNOSIS — K5900 Constipation, unspecified: Secondary | ICD-10-CM

## 2011-06-30 DIAGNOSIS — M545 Low back pain: Secondary | ICD-10-CM

## 2011-06-30 NOTE — Assessment & Plan Note (Signed)
The patient has had no recurrence of angina pectoris.  We'll continue same cardiac meds.  She has had a dry, nonproductive cough, which may improve as we cut back on her losartan HCT to one half tablet daily

## 2011-06-30 NOTE — Progress Notes (Signed)
Vanessa Nielsen Date of Birth:  [birth year] [facility] [address]   [address] [place], [place]  [address] [phone]           Fax   [phone]  History of Present Illness: This pleasant 77 year old woman is seen for a scheduled followup office visit.  She has a complex past medical history.  She's had a history of essential hypertension and history of hyperlipidemia.  She does have known ischemic heart disease.  She underwent cardiac catheterization and angioplasty of her LAD in March of 2008 in [place], [place].  He has had subsequent nuclear stress test most recently 09/18/10, which showed a small area of reversible ischemia at the apex similar to the previous studies of 2009 2010.  Her images were not gated because of arrhythmia.  She has a history of adrenal.. insufficiency and is being followed by Dr. Sharl Ma.  He presently has cushingoid features and Dr. Sharl Ma is directing a very slow, deliberate tapering of her exogenous steroids.  Current Outpatient Prescriptions  Medication Sig Dispense Refill  . amLODipine (NORVASC) 5 MG tablet Take 1 tablet (5 mg total) by mouth daily.  180 tablet  2  . aspirin 81 MG EC tablet Take 81 mg by mouth daily.        . diazepam (VALIUM) 5 MG tablet Take 1 tablet (5 mg total) by mouth daily as needed.  90 tablet  1  . docusate sodium (COLACE) 100 MG capsule Take 100 mg by mouth 2 (two) times daily.       . fentaNYL (DURAGESIC - DOSED MCG/HR) 12 MCG/HR Place 1 patch onto the skin every other day.        Marland Kitchen LIPITOR 10 MG tablet TAKE 1 TABLET EVERY DAY  90 tablet  3  . losartan-hydrochlorothiazide (HYZAAR) 100-25 MG per tablet        . meclizine (ANTIVERT) 25 MG tablet Take 1 tablet (25 mg total) by mouth every 12 (twelve) hours as needed.  30 tablet  3  . metoprolol (LOPRESSOR) 50 MG tablet Take 50 mg by mouth 2 (two) times daily.       . nitroGLYCERIN (NITROSTAT) 0.4 MG SL tablet Place 0.4 mg under the tongue every 5 (five)  minutes as needed.        . potassium chloride (K-DUR) 10 MEQ tablet 2 twice daily  360 tablet  3  . predniSONE (DELTASONE) 5 MG tablet Take 10 mg by mouth as directed.       . promethazine (PHENERGAN) 25 MG tablet Take 25 mg by mouth every 6 (six) hours as needed.        . pseudoephedrine (SUDAFED) 120 MG 12 hr tablet Take 60 mg by mouth every 12 (twelve) hours.        Marland Kitchen DISCONTD: losartan-hydrochlorothiazide (HYZAAR) 100-25 MG per tablet TAKE 1 TABLET EVERY DAY  90 tablet  3    Allergies  Allergen Reactions  . Biaxin   . Cardura (Doxazosin Mesylate)   . Erythromycin   . Toradol   . Tussin     Patient Active Problem List  Diagnoses  . Ischemic heart disease  . Benign hypertensive heart disease without heart failure  . Hypercholesterolemia  . Spina bifida of lumbar spine  . Constipation    History  Smoking status  . Never Smoker   Smokeless tobacco  . Not on file    History  Alcohol Use No    Family History  Problem  Relation Age of Onset  . Cancer Father     colon    Review of Systems: Constitutional: no fever chills diaphoresis or fatigue or change in weight.  Head and neck: no hearing loss, no epistaxis, no photophobia or visual disturbance. Respiratory: No cough, shortness of breath or wheezing. Cardiovascular: No chest pain peripheral edema, palpitations. Gastrointestinal: No abdominal distention, no abdominal pain, no change in bowel habits hematochezia or melena. Genitourinary: No dysuria, no frequency, no urgency, no nocturia. Musculoskeletal:No arthralgias, no back pain, no gait disturbance or myalgias. Neurological: No dizziness, no headaches, no numbness, no seizures, no syncope, no weakness, no tremors. Hematologic: No lymphadenopathy, no easy bruising. Psychiatric: No confusion, no hallucinations, no sleep disturbance.    Physical Exam: Filed Vitals:   06/30/11 1544  BP: 138/80  Pulse: 70   general appearance reveals a well-developed,  well-nourished, cushingoid female, who is in a wheelchair.Pupils equal and reactive.   Extraocular Movements are full.  There is no scleral icterus.  The mouth and pharynx are normal.  The neck is supple.  The carotids reveal no bruits.  The jugular venous pressure is normal.  The thyroid is not enlarged.  There is no lymphadenopathy.  She  does have moon face.  The chest is clear to percussion and auscultation. There are no rales or rhonchi. Expansion of the chest is symmetrical.  The precordium is quiet.  The first heart sound is normal.  The second heart sound is physiologically split.  There is no murmur gallop rub or click.  There is no abnormal lift or heave.  The abdomen is soft and nontender. Bowel sounds are normal. The liver and spleen are not enlarged. There Are no abdominal masses. There are no bruits.  Extremities show no phlebitis.  There is trace edemaThe skin is warm and dry.  There is no rash. Neurologically, she has lower extremity weakness     Assessment / Plan: Continue present medication.  She will return soon for fasting lab work, which was not done prior to this visit.  We will see her in 4 months for followup office visit and

## 2011-06-30 NOTE — Assessment & Plan Note (Signed)
The patient reports that her blood pressure has been under better control recently.  She has been able to take just half of a Hyzaar instead of a full tablet.  Is not having any headaches or dizzy spells.  She travels by wheelchair.  She is sedentary

## 2011-06-30 NOTE — Assessment & Plan Note (Signed)
The patient continues to have a lot of disability from her chronic spina bifida problem.  Her return appointment with her neurosurgeon, Dr. Noel Gerold has been postponed until later in the year.

## 2011-06-30 NOTE — Assessment & Plan Note (Signed)
The patient has been quite uncomfortable because of a lot of GI issues.  She feels that her GI problem is the paramount problem at this time.  She sees Dr. Remo Lipps.  She stays nauseated all the time.  She also has intermittent severe constipation.  Part of the problem with the constipation is that she has to take strong medication for her back, such as a Duragesic patch

## 2011-06-30 NOTE — Patient Instructions (Signed)
Your physician wants you to follow-up in: 4 months. You will receive a reminder letter in the mail two months in advance. If you don't receive a letter, please call our office to schedule the follow-up appointment.  

## 2011-07-03 ENCOUNTER — Other Ambulatory Visit: Payer: Self-pay | Admitting: Gastroenterology

## 2011-07-03 DIAGNOSIS — R110 Nausea: Secondary | ICD-10-CM

## 2011-07-09 ENCOUNTER — Ambulatory Visit
Admission: RE | Admit: 2011-07-09 | Discharge: 2011-07-09 | Disposition: A | Discharge status: Home / Self Care | Payer: Medicare Other | Source: Ambulatory Visit | Attending: Gastroenterology | Admitting: Gastroenterology

## 2011-07-09 DIAGNOSIS — R110 Nausea: Secondary | ICD-10-CM

## 2011-07-09 MED ORDER — IOHEXOL 300 MG/ML  SOLN: 100.0000 mL | Freq: Once | INTRAMUSCULAR | Status: AC | PRN

## 2011-07-16 ENCOUNTER — Other Ambulatory Visit: Payer: Medicare Other | Admitting: *Deleted

## 2011-07-17 ENCOUNTER — Other Ambulatory Visit: Payer: Medicare Other | Admitting: *Deleted

## 2011-07-21 ENCOUNTER — Other Ambulatory Visit (HOSPITAL_COMMUNITY): Payer: Self-pay | Admitting: Gastroenterology

## 2011-07-21 DIAGNOSIS — R110 Nausea: Secondary | ICD-10-CM

## 2011-07-23 ENCOUNTER — Other Ambulatory Visit: Payer: Medicare Other | Admitting: *Deleted

## 2011-07-24 ENCOUNTER — Other Ambulatory Visit (INDEPENDENT_AMBULATORY_CARE_PROVIDER_SITE_OTHER): Payer: Medicare Other | Admitting: *Deleted

## 2011-07-24 DIAGNOSIS — Z79899 Other long term (current) drug therapy: Secondary | ICD-10-CM

## 2011-07-24 DIAGNOSIS — E7800 Pure hypercholesterolemia, unspecified: Secondary | ICD-10-CM

## 2011-07-24 LAB — HEPATIC FUNCTION PANEL
ALT: 17 U/L (ref 0–35)
Albumin: 3.3 g/dL — ABNORMAL LOW (ref 3.5–5.2)
Total Protein: 6.7 g/dL (ref 6.0–8.3)

## 2011-07-24 LAB — BASIC METABOLIC PANEL
BUN: 10 mg/dL (ref 6–23)
CO2: 31 mEq/L (ref 19–32)
Calcium: 9.1 mg/dL (ref 8.4–10.5)
Chloride: 99 mEq/L (ref 96–112)
Creatinine, Ser: 0.8 mg/dL (ref 0.4–1.2)
Glucose, Bld: 115 mg/dL — ABNORMAL HIGH (ref 70–99)

## 2011-07-24 LAB — LIPID PANEL
Cholesterol: 185 mg/dL (ref 0–200)
HDL: 53.4 mg/dL (ref >39.00)
Triglycerides: 174 mg/dL — ABNORMAL HIGH (ref 0.0–149.0)

## 2011-07-29 ENCOUNTER — Telehealth: Payer: Self-pay | Admitting: *Deleted

## 2011-07-29 NOTE — Telephone Encounter (Signed)
Message copied by Burnell Blanks on Tue Jul 29, 2011  9:10 AM ------      Message from: Cassell Clement      Created: Fri Jul 25, 2011  9:16 PM       Please report.  The labs are stable.  Continue same meds.  Continue careful diet.

## 2011-07-29 NOTE — Telephone Encounter (Signed)
Spoke with patient husband and she was still in bed. Advised husband labs ok and will mail copy. If any questions when she receives to call back

## 2011-08-01 ENCOUNTER — Encounter (HOSPITAL_COMMUNITY)
Admission: RE | Admit: 2011-08-01 | Discharge: 2011-08-01 | Disposition: A | Discharge status: Home / Self Care | Payer: Medicare Other | Source: Ambulatory Visit | Attending: Gastroenterology | Admitting: Gastroenterology

## 2011-08-01 DIAGNOSIS — R110 Nausea: Secondary | ICD-10-CM | POA: Insufficient documentation

## 2011-08-01 DIAGNOSIS — R109 Unspecified abdominal pain: Secondary | ICD-10-CM | POA: Insufficient documentation

## 2011-08-01 MED ORDER — TECHNETIUM TC 99M SULFUR COLLOID
2.0000 | Freq: Once | INTRAVENOUS | Status: AC | PRN
  Administered 2011-08-01: 2 via INTRAVENOUS

## 2011-08-11 ENCOUNTER — Telehealth: Payer: Self-pay | Admitting: Cardiology

## 2011-08-11 NOTE — Telephone Encounter (Signed)
New problem Pt wants to talk to you about pro kinetic rx, please call her back

## 2011-08-11 NOTE — Telephone Encounter (Signed)
Advised patient

## 2011-08-11 NOTE — Telephone Encounter (Signed)
Dr Marlane Hatcher office said she would need to get Rx for Pro Kineitc from her cardiologist.  Her son has been researching and even her pain management physician recommended.  She is just so nauseated all the time that she can not do much of anything.  States it is more debilitating than the wheelchair.

## 2011-08-11 NOTE — Telephone Encounter (Signed)
She will need to get this gastrointestinal request medicine from her GI physician.

## 2011-09-18 DIAGNOSIS — E249 Cushing's syndrome, unspecified: Secondary | ICD-10-CM | POA: Diagnosis not present

## 2011-09-19 DIAGNOSIS — E249 Cushing's syndrome, unspecified: Secondary | ICD-10-CM | POA: Diagnosis not present

## 2011-10-01 DIAGNOSIS — K5901 Slow transit constipation: Secondary | ICD-10-CM | POA: Diagnosis not present

## 2011-10-01 DIAGNOSIS — R110 Nausea: Secondary | ICD-10-CM | POA: Diagnosis not present

## 2011-10-06 DIAGNOSIS — M4150 Other secondary scoliosis, site unspecified: Secondary | ICD-10-CM | POA: Diagnosis not present

## 2011-10-06 DIAGNOSIS — M47817 Spondylosis without myelopathy or radiculopathy, lumbosacral region: Secondary | ICD-10-CM | POA: Diagnosis not present

## 2011-10-06 DIAGNOSIS — Q062 Diastematomyelia: Secondary | ICD-10-CM | POA: Diagnosis not present

## 2011-10-22 DIAGNOSIS — E249 Cushing's syndrome, unspecified: Secondary | ICD-10-CM | POA: Diagnosis not present

## 2011-10-23 DIAGNOSIS — M4120 Other idiopathic scoliosis, site unspecified: Secondary | ICD-10-CM | POA: Diagnosis not present

## 2011-10-23 DIAGNOSIS — M545 Low back pain: Secondary | ICD-10-CM | POA: Diagnosis not present

## 2011-10-23 DIAGNOSIS — M5137 Other intervertebral disc degeneration, lumbosacral region: Secondary | ICD-10-CM | POA: Diagnosis not present

## 2011-10-29 ENCOUNTER — Other Ambulatory Visit (INDEPENDENT_AMBULATORY_CARE_PROVIDER_SITE_OTHER): Payer: Medicare Other | Admitting: *Deleted

## 2011-10-29 DIAGNOSIS — E7800 Pure hypercholesterolemia, unspecified: Secondary | ICD-10-CM

## 2011-10-29 LAB — LIPID PANEL
Cholesterol: 189 mg/dL (ref 0–200)
HDL: 49.3 mg/dL (ref >39.00)
Total CHOL/HDL Ratio: 4
Triglycerides: 132 mg/dL (ref 0.0–149.0)

## 2011-10-29 LAB — HEPATIC FUNCTION PANEL
ALT: 14 U/L (ref 0–35)
AST: 20 U/L (ref 0–37)
Albumin: 3.2 g/dL — ABNORMAL LOW (ref 3.5–5.2)
Alkaline Phosphatase: 81 U/L (ref 39–117)
Total Protein: 6.6 g/dL (ref 6.0–8.3)

## 2011-10-29 LAB — BASIC METABOLIC PANEL
CO2: 29 mEq/L (ref 19–32)
Calcium: 9.3 mg/dL (ref 8.4–10.5)
Creatinine, Ser: 0.8 mg/dL (ref 0.4–1.2)
GFR: 76.48 mL/min (ref >60.00)

## 2011-10-29 NOTE — Progress Notes (Signed)
Quick Note:  Please make copy of labs for patient visit. ______ 

## 2011-11-03 ENCOUNTER — Encounter: Payer: Self-pay | Admitting: Cardiology

## 2011-11-03 ENCOUNTER — Ambulatory Visit (INDEPENDENT_AMBULATORY_CARE_PROVIDER_SITE_OTHER): Payer: Medicare Other | Admitting: Cardiology

## 2011-11-03 VITALS — BP 130/90 | HR 70 | Ht 59.0 in | Wt 186.0 lb

## 2011-11-03 DIAGNOSIS — I119 Hypertensive heart disease without heart failure: Secondary | ICD-10-CM | POA: Diagnosis not present

## 2011-11-03 DIAGNOSIS — E7800 Pure hypercholesterolemia, unspecified: Secondary | ICD-10-CM | POA: Diagnosis not present

## 2011-11-03 DIAGNOSIS — K5900 Constipation, unspecified: Secondary | ICD-10-CM

## 2011-11-03 DIAGNOSIS — I259 Chronic ischemic heart disease, unspecified: Secondary | ICD-10-CM

## 2011-11-03 NOTE — Assessment & Plan Note (Signed)
The patient's main complaint today is ongoing problems with nausea secondary to constipation.  He attributes to constipation to the fact that she is physically inactive as well as he crowding of her bowel because of her spina bifida.  She is followed by Dr. Ewing Schlein for her GI issues

## 2011-11-03 NOTE — Assessment & Plan Note (Signed)
The patient has hypercholesterolemia and is on Lipitor 10 mg daily.  Her LDL is slightly elevated.  The patient does not tolerate high dose Lipitor because of myalgias.

## 2011-11-03 NOTE — Patient Instructions (Signed)
Your physician recommends that you continue on your current medications as directed. Please refer to the Current Medication list given to you today.  Your physician recommends that you schedule a follow-up appointment in: 4 months with fasting labs (lp/bmet/hfp)  

## 2011-11-03 NOTE — Progress Notes (Signed)
Vanessa Nielsen Date of Birth:  1933-11-17 [facility] [address] [place], [place]  [address] [phone]         Fax   [phone]  History of Present Illness: This pleasant 78 year old woman is seen for a scheduled followup office visit.  She has a history of known ischemic heart disease and has had previous bypass surgery in [place].  Has a long history of hypertensive cardiovascular disease as well as hypercholesterolemia.  She has spina bifida and scoliosis and has a lot of GI issues including severe constipation.  She has difficulty with ambulation and has to be in a wheelchair a good bit of the time.  If she does walk she has to use a walker.  Current Outpatient Prescriptions  Medication Sig Dispense Refill  . amLODipine (NORVASC) 5 MG tablet Take 1 tablet (5 mg total) by mouth daily.  180 tablet  2  . aspirin 81 MG EC tablet Take 81 mg by mouth daily.        . diazepam (VALIUM) 5 MG tablet Take 1 tablet (5 mg total) by mouth daily as needed.  90 tablet  1  . docusate sodium (COLACE) 100 MG capsule Take 100 mg by mouth 2 (two) times daily.       Marland Kitchen esomeprazole (NEXIUM) 40 MG capsule Take 40 mg by mouth daily before breakfast.      . fentaNYL (DURAGESIC - DOSED MCG/HR) 12 MCG/HR Place 1 patch onto the skin every other day.        Marland Kitchen LIPITOR 10 MG tablet TAKE 1 TABLET EVERY DAY  90 tablet  3  . losartan-hydrochlorothiazide (HYZAAR) 100-25 MG per tablet Taking 1/2 daily      . meclizine (ANTIVERT) 25 MG tablet Take 1 tablet (25 mg total) by mouth every 12 (twelve) hours as needed.  30 tablet  3  . metoprolol (LOPRESSOR) 50 MG tablet Take 50 mg by mouth 2 (two) times daily.       . nitroGLYCERIN (NITROSTAT) 0.4 MG SL tablet Place 0.4 mg under the tongue every 5 (five) minutes as needed.        . potassium chloride (K-DUR) 10 MEQ tablet 2 twice daily  360 tablet  3  . Probiotic Product (ALIGN PO) Take by mouth daily.      . promethazine (PHENERGAN) 25  MG tablet Take 25 mg by mouth every 6 (six) hours as needed.        . pseudoephedrine (SUDAFED) 120 MG 12 hr tablet Take 60 mg by mouth every 12 (twelve) hours.        . hydrocortisone (CORTEF) 10 MG tablet Take 10 mg by mouth as directed.        Allergies  Allergen Reactions  . Biaxin   . Cardura (Doxazosin Mesylate)   . Erythromycin   . Toradol   . Tussin     Patient Active Problem List  Diagnoses  . Ischemic heart disease  . Benign hypertensive heart disease without heart failure  . Hypercholesterolemia  . Spina bifida of lumbar spine  . Constipation    History  Smoking status  . Never Smoker   Smokeless tobacco  . Not on file    History  Alcohol Use No    Family History  Problem Relation Age of Onset  . Cancer Father     colon    Review of Systems: Constitutional: no fever chills diaphoresis or fatigue or change in weight.  Head and  neck: no hearing loss, no epistaxis, no photophobia or visual disturbance. Respiratory: No cough, shortness of breath or wheezing. Cardiovascular: No chest pain peripheral edema, palpitations. Gastrointestinal: No abdominal distention, no abdominal pain, no change in bowel habits hematochezia or melena. Genitourinary: No dysuria, no frequency, no urgency, no nocturia. Musculoskeletal:No arthralgias, no back pain, no gait disturbance or myalgias. Neurological: No dizziness, no headaches, no numbness, no seizures, no syncope, no weakness, no tremors. Hematologic: No lymphadenopathy, no easy bruising. Psychiatric: No confusion, no hallucinations, no sleep disturbance.    Physical Exam: Filed Vitals:   11/03/11 1430  BP: 130/90  Pulse: 70   the general appearance reveals a well-developed well-nourished middle-aged woman in no distress.  She is in a wheelchair.  Pupils equal and reactive.   Extraocular Movements are full.  There is no scleral icterus.  The mouth and pharynx are normal.  The neck is supple.  The carotids reveal no  bruits.  The jugular venous pressure is normal.  The thyroid is not enlarged.  There is no lymphadenopathy.  The chest is clear to percussion and auscultation. There are no rales or rhonchi. Expansion of the chest is symmetrical.  Heart reveals a soft systolic ejection murmur at the base.  No diastolic murmur.  No gallop or rub.  The abdomen is soft and nontender.  Bowel sounds are present.  Extremities show mild pretibial and ankle edema.The skin is warm and dry.  There is no rash.  EKG today shows normal sinus rhythm with frequent premature atrial beats and frequent PVCs.  There are no ischemic changes   Assessment / Plan: Continue same medication.  She will try to increase her MiraLax to help her constipation.  She will talk with her gastroenterologist about her ongoing nausea.  Recheck here in 4 months for followup office visit and fasting lab work

## 2011-11-03 NOTE — Assessment & Plan Note (Signed)
The patient has not been experiencing any recurrent angina pectoris.  She has not had taken a sublingual nitroglycerin recently.

## 2011-11-04 DIAGNOSIS — M47817 Spondylosis without myelopathy or radiculopathy, lumbosacral region: Secondary | ICD-10-CM | POA: Diagnosis not present

## 2011-11-04 DIAGNOSIS — M159 Polyosteoarthritis, unspecified: Secondary | ICD-10-CM | POA: Diagnosis not present

## 2011-11-04 DIAGNOSIS — M4150 Other secondary scoliosis, site unspecified: Secondary | ICD-10-CM | POA: Diagnosis not present

## 2011-11-04 DIAGNOSIS — Q062 Diastematomyelia: Secondary | ICD-10-CM | POA: Diagnosis not present

## 2011-11-05 DIAGNOSIS — R110 Nausea: Secondary | ICD-10-CM | POA: Diagnosis not present

## 2011-11-05 DIAGNOSIS — R197 Diarrhea, unspecified: Secondary | ICD-10-CM | POA: Diagnosis not present

## 2011-11-05 DIAGNOSIS — M79609 Pain in unspecified limb: Secondary | ICD-10-CM | POA: Diagnosis not present

## 2011-11-05 DIAGNOSIS — R141 Gas pain: Secondary | ICD-10-CM | POA: Diagnosis not present

## 2011-11-05 DIAGNOSIS — R142 Eructation: Secondary | ICD-10-CM | POA: Diagnosis not present

## 2011-11-07 DIAGNOSIS — E249 Cushing's syndrome, unspecified: Secondary | ICD-10-CM | POA: Diagnosis not present

## 2011-11-17 ENCOUNTER — Telehealth: Payer: Self-pay | Admitting: Cardiology

## 2011-11-17 NOTE — Telephone Encounter (Signed)
Follow up from previous call:  Patient calling back to speak with nurse.  

## 2011-11-17 NOTE — Telephone Encounter (Signed)
Please return call to patient @ hm# [phone]  Patient having trouble breathing at night, but not during the day time. Patient would like to be seen ASAP

## 2011-11-17 NOTE — Telephone Encounter (Signed)
States her nose and mouth get really dry and at night she has trouble breathing.  Is using Afrin nasal drops, even though she knows she shouldn't, blood pressure 171/87 HR 89. States it is just that her nose is so dry she can not breath.  See or refer?

## 2011-11-17 NOTE — Telephone Encounter (Signed)
LEFT MESSAGE TO CALL BACK  Did get patient scheduled to see Dr Haroldine Laws on 11/19/11 at 1:15 pm

## 2011-11-18 NOTE — Telephone Encounter (Signed)
Advised patient of date and time

## 2011-11-18 NOTE — Telephone Encounter (Signed)
Pt rtn cal to [employer]

## 2011-11-24 DIAGNOSIS — J0100 Acute maxillary sinusitis, unspecified: Secondary | ICD-10-CM | POA: Diagnosis not present

## 2011-11-25 DIAGNOSIS — M545 Low back pain: Secondary | ICD-10-CM | POA: Diagnosis not present

## 2011-11-25 DIAGNOSIS — M4120 Other idiopathic scoliosis, site unspecified: Secondary | ICD-10-CM | POA: Diagnosis not present

## 2011-12-01 ENCOUNTER — Telehealth: Payer: Self-pay | Admitting: Cardiology

## 2011-12-01 NOTE — Telephone Encounter (Signed)
1) Is concerned about recent adds she had been seeing on TV about Lipitor causing diabetes. Advised had not heard anything about this.  Will forward to  Dr. Patty Sermons for review.

## 2011-12-01 NOTE — Telephone Encounter (Signed)
Fu call Pt was calling back again please call

## 2011-12-01 NOTE — Telephone Encounter (Signed)
New msg Pt wanted to talk to you and wouldn't tell my why. Please call her back 

## 2011-12-02 NOTE — Telephone Encounter (Signed)
Continue lipitor.  Known benefits outweigh risks.

## 2011-12-03 NOTE — Telephone Encounter (Signed)
Left message at patients home.

## 2011-12-15 DIAGNOSIS — Z760 Encounter for issue of repeat prescription: Secondary | ICD-10-CM | POA: Diagnosis not present

## 2011-12-15 DIAGNOSIS — R110 Nausea: Secondary | ICD-10-CM | POA: Diagnosis not present

## 2011-12-15 DIAGNOSIS — K5900 Constipation, unspecified: Secondary | ICD-10-CM | POA: Diagnosis not present

## 2011-12-15 DIAGNOSIS — I10 Essential (primary) hypertension: Secondary | ICD-10-CM | POA: Diagnosis not present

## 2011-12-15 DIAGNOSIS — Z8719 Personal history of other diseases of the digestive system: Secondary | ICD-10-CM | POA: Diagnosis not present

## 2011-12-18 DIAGNOSIS — M4150 Other secondary scoliosis, site unspecified: Secondary | ICD-10-CM | POA: Diagnosis not present

## 2011-12-18 DIAGNOSIS — Q062 Diastematomyelia: Secondary | ICD-10-CM | POA: Diagnosis not present

## 2011-12-18 DIAGNOSIS — M47817 Spondylosis without myelopathy or radiculopathy, lumbosacral region: Secondary | ICD-10-CM | POA: Diagnosis not present

## 2011-12-18 DIAGNOSIS — IMO0002 Reserved for concepts with insufficient information to code with codable children: Secondary | ICD-10-CM | POA: Diagnosis not present

## 2012-01-07 ENCOUNTER — Telehealth: Payer: Self-pay | Admitting: Cardiology

## 2012-01-07 DIAGNOSIS — E249 Cushing's syndrome, unspecified: Secondary | ICD-10-CM | POA: Diagnosis not present

## 2012-01-07 NOTE — Telephone Encounter (Signed)
Back has been really bad and is trying to get in to see Dr Danielle Dess

## 2012-01-07 NOTE — Telephone Encounter (Signed)
Pt calling for melinda,not feeling well

## 2012-01-07 NOTE — Telephone Encounter (Signed)
Will send last ov to Dr Danielle Dess as requested.    Patient was having diarrhea real bad one night and started Meclizine about a couple weeks ago.  Dizziness is better but still has problems when "changes elevation".  Just seems to be taking a long time.  When gets up at night to go to bathroom room goes around, doesn't last long.  Will forward to  Dr. Patty Sermons for review for any other recommendations

## 2012-01-08 NOTE — Telephone Encounter (Signed)
Agree with plan 

## 2012-01-12 ENCOUNTER — Other Ambulatory Visit: Payer: Self-pay | Admitting: *Deleted

## 2012-01-12 DIAGNOSIS — F419 Anxiety disorder, unspecified: Secondary | ICD-10-CM

## 2012-01-12 DIAGNOSIS — E249 Cushing's syndrome, unspecified: Secondary | ICD-10-CM | POA: Diagnosis not present

## 2012-01-12 MED ORDER — DIAZEPAM 5 MG PO TABS: 5.0000 mg | ORAL_TABLET | Freq: Every day | ORAL | Status: DC | PRN

## 2012-01-12 NOTE — Telephone Encounter (Signed)
Refill one time

## 2012-01-15 DIAGNOSIS — M47817 Spondylosis without myelopathy or radiculopathy, lumbosacral region: Secondary | ICD-10-CM | POA: Diagnosis not present

## 2012-01-15 DIAGNOSIS — IMO0002 Reserved for concepts with insufficient information to code with codable children: Secondary | ICD-10-CM | POA: Diagnosis not present

## 2012-01-15 DIAGNOSIS — Q062 Diastematomyelia: Secondary | ICD-10-CM | POA: Diagnosis not present

## 2012-01-15 DIAGNOSIS — M1710 Unilateral primary osteoarthritis, unspecified knee: Secondary | ICD-10-CM | POA: Diagnosis not present

## 2012-01-20 ENCOUNTER — Other Ambulatory Visit: Payer: Self-pay | Admitting: Cardiology

## 2012-01-21 ENCOUNTER — Telehealth: Payer: Self-pay | Admitting: *Deleted

## 2012-01-21 DIAGNOSIS — M47817 Spondylosis without myelopathy or radiculopathy, lumbosacral region: Secondary | ICD-10-CM | POA: Diagnosis not present

## 2012-01-21 DIAGNOSIS — M545 Low back pain: Secondary | ICD-10-CM | POA: Diagnosis not present

## 2012-01-21 NOTE — Telephone Encounter (Signed)
Refilled metoprolol 

## 2012-01-21 NOTE — Telephone Encounter (Signed)
Patient was having diarrhea real bad one night and started Meclizine about a couple weeks ago. Dizziness is better but still has problems when "changes elevation". Just seems to be taking a long time. When gets up at night to go to bathroom room goes around, doesn't last long. Will forward to Dr. Patty Sermons for review for any other recommendations   Discussed with  Dr. Patty Sermons and advised to go to see her ENT, verbalized understanding

## 2012-01-23 ENCOUNTER — Telehealth: Payer: Self-pay | Admitting: Cardiology

## 2012-01-23 NOTE — Telephone Encounter (Signed)
Dr elsner's office calling to let dr Patty Sermons know they have set up an appt for pt 5-30 @315p  with an arrival time of 245p per his request, they couldn't reach the pt and would like his nurse to inform the pt of the appt date and time

## 2012-01-23 NOTE — Telephone Encounter (Signed)
Advised patient

## 2012-02-10 ENCOUNTER — Other Ambulatory Visit: Payer: Self-pay | Admitting: Cardiology

## 2012-02-13 ENCOUNTER — Other Ambulatory Visit: Payer: Self-pay | Admitting: Cardiology

## 2012-02-16 DIAGNOSIS — M25559 Pain in unspecified hip: Secondary | ICD-10-CM | POA: Diagnosis not present

## 2012-02-16 DIAGNOSIS — M47817 Spondylosis without myelopathy or radiculopathy, lumbosacral region: Secondary | ICD-10-CM | POA: Diagnosis not present

## 2012-02-16 DIAGNOSIS — M129 Arthropathy, unspecified: Secondary | ICD-10-CM | POA: Diagnosis not present

## 2012-02-16 DIAGNOSIS — M159 Polyosteoarthritis, unspecified: Secondary | ICD-10-CM | POA: Diagnosis not present

## 2012-02-17 NOTE — Progress Notes (Signed)
Addended by: Reine Just on: 02/17/2012 08:14 PM   Modules accepted: Orders

## 2012-02-19 DIAGNOSIS — M19019 Primary osteoarthritis, unspecified shoulder: Secondary | ICD-10-CM | POA: Diagnosis not present

## 2012-02-24 ENCOUNTER — Other Ambulatory Visit (INDEPENDENT_AMBULATORY_CARE_PROVIDER_SITE_OTHER): Payer: Medicare Other

## 2012-02-24 DIAGNOSIS — E7800 Pure hypercholesterolemia, unspecified: Secondary | ICD-10-CM | POA: Diagnosis not present

## 2012-02-24 DIAGNOSIS — I119 Hypertensive heart disease without heart failure: Secondary | ICD-10-CM

## 2012-02-24 LAB — HEPATIC FUNCTION PANEL
AST: 16 U/L (ref 0–37)
Bilirubin, Direct: 0 mg/dL (ref 0.0–0.3)
Total Bilirubin: 0.2 mg/dL — ABNORMAL LOW (ref 0.3–1.2)

## 2012-02-24 LAB — BASIC METABOLIC PANEL
BUN: 15 mg/dL (ref 6–23)
Creatinine, Ser: 0.8 mg/dL (ref 0.4–1.2)
GFR: 78.74 mL/min (ref >60.00)
Potassium: 3.8 mEq/L (ref 3.5–5.1)

## 2012-02-24 LAB — LIPID PANEL
Cholesterol: 187 mg/dL (ref 0–200)
LDL Cholesterol: 111 mg/dL — ABNORMAL HIGH (ref 0–99)
Triglycerides: 142 mg/dL (ref 0.0–149.0)
VLDL: 28.4 mg/dL (ref 0.0–40.0)

## 2012-02-24 NOTE — Progress Notes (Signed)
Quick Note:  Please make copy of labs for patient visit. ______ 

## 2012-02-24 NOTE — Progress Notes (Signed)
Addended by: Jacklynn Dehaas L on: 02/24/2012 05:17 PM   Modules accepted: Orders  

## 2012-02-27 DIAGNOSIS — M545 Low back pain: Secondary | ICD-10-CM | POA: Diagnosis not present

## 2012-03-01 DIAGNOSIS — E249 Cushing's syndrome, unspecified: Secondary | ICD-10-CM | POA: Diagnosis not present

## 2012-03-02 ENCOUNTER — Encounter: Payer: Self-pay | Admitting: Cardiology

## 2012-03-02 ENCOUNTER — Ambulatory Visit (INDEPENDENT_AMBULATORY_CARE_PROVIDER_SITE_OTHER): Payer: Medicare Other | Admitting: Cardiology

## 2012-03-02 VITALS — BP 136/77 | HR 66 | Ht 59.0 in | Wt 176.1 lb

## 2012-03-02 DIAGNOSIS — E7800 Pure hypercholesterolemia, unspecified: Secondary | ICD-10-CM | POA: Diagnosis not present

## 2012-03-02 DIAGNOSIS — I2510 Atherosclerotic heart disease of native coronary artery without angina pectoris: Secondary | ICD-10-CM

## 2012-03-02 DIAGNOSIS — I119 Hypertensive heart disease without heart failure: Secondary | ICD-10-CM | POA: Diagnosis not present

## 2012-03-02 DIAGNOSIS — Q057 Lumbar spina bifida without hydrocephalus: Secondary | ICD-10-CM

## 2012-03-02 DIAGNOSIS — I259 Chronic ischemic heart disease, unspecified: Secondary | ICD-10-CM | POA: Diagnosis not present

## 2012-03-02 MED ORDER — MOMETASONE FUROATE 50 MCG/ACT NA SUSP: 2.0000 | Freq: Every day | NASAL | Status: DC

## 2012-03-02 MED ORDER — METOPROLOL TARTRATE 50 MG PO TABS: 50.0000 mg | ORAL_TABLET | Freq: Three times a day (TID) | ORAL | Status: DC

## 2012-03-02 NOTE — Assessment & Plan Note (Signed)
Her blood pressure continues to be very labile.  She brought in a compilation of her home blood pressures for me to look at today.  Frequently when she wakes up at night to go to the bathroom she feels like her blood pressure is elevated and last night for instance it is 179/71 and her pulse was 87.  Her blood pressure is elevated she takes either one or 2 metoprolol tartrate tablets.  The patient has been having difficulty breathing and has been using Afrin nose spray which may be aggravating her blood pressure.  We're going to have her not use Afrin and states she will use Nasonex nasal spray 2 sprays in each nostril at bedtime

## 2012-03-02 NOTE — Patient Instructions (Addendum)
Nasonex 2 sprays each nostril at bedtime.  Increase Lopressor to 50mg  three times per day.  Your physician recommends that you schedule a follow-up appointment in: 4 months with Dr. Patty Sermons.  Your physician recommends that you return for lab work in: 4 months - Lipid, Liver, BMET.

## 2012-03-02 NOTE — Assessment & Plan Note (Signed)
The patient has not been experiencing any chest pain or angina. 

## 2012-03-02 NOTE — Progress Notes (Signed)
Vanessa Nielsen Date of Birth:  09-19-1933 [facility] [address] [place], [place]  [address] [phone]         Fax   [phone]  History of Present Illness: This pleasant 78 year old woman is seen back for a four-month followup office visit.  She has a past history of ischemic heart disease.  She has a long history of hypertensive cardiovascular disease and hypercholesterolemia.  She has had previous bypass graft surgery in [place] she has multiple orthopedic issues.  She has spina bifida and scoliosis.  She also has had a lot of GI issues with severe constipation alternating with explosive diarrhea and fecal incontinence.  She ambulates with difficulty and is in a wheelchair most of the time.  When she does walk she uses a walker.  She's had a lot of problems with nasal stuffiness and difficulty breathing at night.  Current Outpatient Prescriptions  Medication Sig Dispense Refill  . amLODipine (NORVASC) 5 MG tablet TAKE 1 TABLET (5 MG TOTAL) BY MOUTH DAILY.  180 tablet  1  . aspirin 81 MG EC tablet Take 81 mg by mouth daily.        . diazepam (VALIUM) 5 MG tablet Take 1 tablet (5 mg total) by mouth daily as needed.  90 tablet  0  . docusate sodium (COLACE) 100 MG capsule Take 100 mg by mouth 2 (two) times daily.       Marland Kitchen esomeprazole (NEXIUM) 40 MG capsule Take 40 mg by mouth daily before breakfast.      . fentaNYL (DURAGESIC - DOSED MCG/HR) 12 MCG/HR Place 1 patch onto the skin every other day.        . hydrocortisone (CORTEF) 10 MG tablet Take 10 mg by mouth as directed.      Marland Kitchen LIPITOR 10 MG tablet TAKE 1 TABLET EVERY DAY  90 tablet  3  . losartan-hydrochlorothiazide (HYZAAR) 100-25 MG per tablet Taking 1/2 daily      . meclizine (ANTIVERT) 25 MG tablet TAKE 1 TABLET (25 MG TOTAL) BY MOUTH EVERY 12 (TWELVE) HOURS AS NEEDED.  30 tablet  3  . metoprolol (LOPRESSOR) 50 MG tablet Take 1 tablet (50 mg total) by mouth 3 (three) times daily.  270 tablet  3    . nitroGLYCERIN (NITROSTAT) 0.4 MG SL tablet Place 0.4 mg under the tongue every 5 (five) minutes as needed.        . potassium chloride (K-DUR) 10 MEQ tablet 2 twice daily  360 tablet  3  . Probiotic Product (ALIGN PO) Take by mouth daily.      . promethazine (PHENERGAN) 25 MG tablet Take 25 mg by mouth every 6 (six) hours as needed.        . pseudoephedrine (SUDAFED) 120 MG 12 hr tablet Take 60 mg by mouth every 12 (twelve) hours.        Marland Kitchen DISCONTD: metoprolol (LOPRESSOR) 50 MG tablet Take 1 tablet (50 mg total) by mouth 2 (two) times daily.  180 tablet  3  . mometasone (NASONEX) 50 MCG/ACT nasal spray Place 2 sprays into the nose daily.  17 g  12    Allergies  Allergen Reactions  . Cardura (Doxazosin Mesylate)   . Clarithromycin   . Erythromycin   . Guaifenesin   . Ketorolac Tromethamine     Patient Active Problem List  Diagnosis  . Ischemic heart disease  . Benign hypertensive heart disease without heart failure  . Hypercholesterolemia  . Spina  bifida of lumbar spine  . Constipation    History  Smoking status  . Never Smoker   Smokeless tobacco  . Not on file    History  Alcohol Use No    Family History  Problem Relation Age of Onset  . Cancer Father     colon    Review of Systems: Constitutional: no fever chills diaphoresis or fatigue or change in weight.  Head and neck: no hearing loss, no epistaxis, no photophobia or visual disturbance. Respiratory: No cough, shortness of breath or wheezing. Cardiovascular: No chest pain peripheral edema, palpitations. Gastrointestinal: No abdominal distention, no abdominal pain, no change in bowel habits hematochezia or melena. Genitourinary: No dysuria, no frequency, no urgency, no nocturia. Musculoskeletal:No arthralgias, no back pain, no gait disturbance or myalgias. Neurological: No dizziness, no headaches, no numbness, no seizures, no syncope, no weakness, no tremors. Hematologic: No lymphadenopathy, no easy  bruising. Psychiatric: No confusion, no hallucinations, no sleep disturbance.    Physical Exam: Filed Vitals:   03/02/12 1520  BP: 136/77  Pulse: 66   the general appearance reveals a somewhat overweight woman in a wheelchair.  She has spina bifida.  He is alert and oriented.  She is in no acute distress.Pupils equal and reactive.   Extraocular Movements are full.  There is no scleral icterus.  The mouth and pharynx are normal.  The neck is supple.  The carotids reveal no bruits.  The jugular venous pressure is normal.  The thyroid is not enlarged.  There is no lymphadenopathy.  The chest is clear to percussion and auscultation. There are no rales or rhonchi. Expansion of the chest is symmetrical.  The precordium is quiet.  The first heart sound is normal.  The second heart sound is physiologically split.  There is no murmur gallop rub or click.  There is no abnormal lift or heave.  The abdomen is soft and nontender. Bowel sounds are normal. The liver and spleen are not enlarged. There Are no abdominal masses. There are no bruits.  Extremities reveal bilateral chronic lower extremity edema.   Assessment / Plan: The patient is to continue same medication.  We are increasing her metoprolol tartrate 250 mg 3 times a day for better blood pressure control.  We are going to give her some trial of Nasonex nasal spray to allow her to not use Afrin nasal spray.  She'll return in 4 months for followup office visit and fasting lab work.

## 2012-03-02 NOTE — Assessment & Plan Note (Signed)
The patient has had severe problems with constipation and explosive diarrhea.  At the present time she has been taking 2 doses of MiraLax at bedtime.  She is presently seeing a gastroenterologist at [facility].  She will also explore the possibility of possible gluten sensitivity was in some of her symptoms of postprandial bloating

## 2012-03-02 NOTE — Assessment & Plan Note (Signed)
Patient has a history of hypercholesterolemia.  She is on low-dose Lipitor because of her multiple musculoskeletal problems and leg weakness.  Her cholesterol remains mildly elevated.

## 2012-03-02 NOTE — Assessment & Plan Note (Signed)
Patient continues to have a lot of problems with back and leg pain related to her spina bifida.  She is now seeing Dr. Yevette Edwards for her back.

## 2012-03-10 DIAGNOSIS — E249 Cushing's syndrome, unspecified: Secondary | ICD-10-CM | POA: Diagnosis not present

## 2012-03-17 DIAGNOSIS — M4150 Other secondary scoliosis, site unspecified: Secondary | ICD-10-CM | POA: Diagnosis not present

## 2012-03-17 DIAGNOSIS — Q062 Diastematomyelia: Secondary | ICD-10-CM | POA: Diagnosis not present

## 2012-03-17 DIAGNOSIS — M47817 Spondylosis without myelopathy or radiculopathy, lumbosacral region: Secondary | ICD-10-CM | POA: Diagnosis not present

## 2012-04-01 DIAGNOSIS — R143 Flatulence: Secondary | ICD-10-CM | POA: Diagnosis not present

## 2012-04-01 DIAGNOSIS — K5900 Constipation, unspecified: Secondary | ICD-10-CM | POA: Diagnosis not present

## 2012-04-01 DIAGNOSIS — Z8719 Personal history of other diseases of the digestive system: Secondary | ICD-10-CM | POA: Diagnosis not present

## 2012-04-01 DIAGNOSIS — R142 Eructation: Secondary | ICD-10-CM | POA: Diagnosis not present

## 2012-04-01 DIAGNOSIS — R141 Gas pain: Secondary | ICD-10-CM | POA: Diagnosis not present

## 2012-04-01 DIAGNOSIS — R110 Nausea: Secondary | ICD-10-CM | POA: Diagnosis not present

## 2012-04-15 DIAGNOSIS — M19079 Primary osteoarthritis, unspecified ankle and foot: Secondary | ICD-10-CM | POA: Diagnosis not present

## 2012-04-15 DIAGNOSIS — M19019 Primary osteoarthritis, unspecified shoulder: Secondary | ICD-10-CM | POA: Diagnosis not present

## 2012-04-19 ENCOUNTER — Telehealth: Payer: Self-pay | Admitting: Cardiology

## 2012-04-19 MED ORDER — FLUTICASONE PROPIONATE 50 MCG/ACT NA SUSP: 2.0000 | Freq: Every day | NASAL | Status: DC

## 2012-04-19 NOTE — Telephone Encounter (Signed)
Okay for patient to try Flonase

## 2012-04-19 NOTE — Telephone Encounter (Signed)
Please return call to patient [phone], regarding sporadic BP.

## 2012-04-19 NOTE — Telephone Encounter (Signed)
Advised patient

## 2012-04-19 NOTE — Telephone Encounter (Signed)
Having problems with nose being stopped up when she lays down and when that happens it sends her blood pressure up.  Had been using  Afrin but stopped.  Has been using Nasonex but not helping, used little remedies for children over weekend (per son advise). Would like to try Flonase to see if that would help.  Will forward to  Dr. Patty Sermons for review

## 2012-04-25 ENCOUNTER — Telehealth: Payer: Self-pay | Admitting: Cardiology

## 2012-04-25 NOTE — Telephone Encounter (Signed)
Returned phone call to Vanessa Nielsen. She was recently evaluated by GI and started on carafate this morning. She now has an itchy rash on bilat hands. She states she has a long history of allergic reactions and this is not surprising to her. She normally takes benadryl and/or prednisone with relief. She denies chest pain or sob. I instructed her to take benadryl tonight and do not take any more carafate until she speaks with the prescribing provider. She understands to see immediate medical evaluation if her symptoms worsen.   [place], PA-C 04/25/2012 6:03 PM

## 2012-04-30 ENCOUNTER — Other Ambulatory Visit: Payer: Self-pay | Admitting: *Deleted

## 2012-04-30 DIAGNOSIS — F419 Anxiety disorder, unspecified: Secondary | ICD-10-CM

## 2012-04-30 MED ORDER — DIAZEPAM 5 MG PO TABS: 5.0000 mg | ORAL_TABLET | Freq: Every day | ORAL | Status: DC | PRN

## 2012-04-30 NOTE — Telephone Encounter (Signed)
Refill on Valium.

## 2012-05-03 ENCOUNTER — Telehealth: Payer: Self-pay | Admitting: Cardiology

## 2012-05-03 DIAGNOSIS — N3000 Acute cystitis without hematuria: Secondary | ICD-10-CM | POA: Diagnosis not present

## 2012-05-03 DIAGNOSIS — G8190 Hemiplegia, unspecified affecting unspecified side: Secondary | ICD-10-CM | POA: Diagnosis not present

## 2012-05-03 DIAGNOSIS — N390 Urinary tract infection, site not specified: Secondary | ICD-10-CM | POA: Diagnosis not present

## 2012-05-03 NOTE — Telephone Encounter (Signed)
Pt has been set up with [facility]CP but her appt is not until Sept and she has a urinary tract infection and needs a antibiotic called into [facility] on [address] the phone number for pharm [phone]

## 2012-05-03 NOTE — Telephone Encounter (Signed)
PT  Calling stating she thinks she has UTI and she wants [place] to call in antibiotics--she has appoint with one of [facility] in future,but has not been seen there yet--advised to go to nearest urgent care , as without urinalysis we cannot prescribe any antibiotics--pt agrees to go to urgent care

## 2012-05-10 ENCOUNTER — Telehealth: Payer: Self-pay | Admitting: Physician Assistant

## 2012-05-10 NOTE — Telephone Encounter (Signed)
Patient called the answering service re: nausea, vomiting and chest pain. She reports that she has been experiencing nausea for several days. This afternoon she endorses NBNB emesis and dry heaves. She does have a GI history described as IBS. She also notes sharp, intermittent substernal chest pain without radiation. No associated shortness of breath, lightheadedness, palpitations, LE edema, PND or orthopnea. She is wheelchair bound and thus does not exert herself much or relate her chest pain to exertion. Reviewing her chart, she does have a history of CAD s/p CABG and cardiac RFs. She states she has had no prior episodes of chest pain or angina leading up to today. Advised that it would be best to evaluated by a provider in an emergency department tonight to rule out acute causes of chest pain. She expressed hesitancy due to wait times in the emergency department and wished to continue to monitor her symptoms. I advised to remain hydrated and try NTG for chest pain relief. She will call back and present to the emergency department if her symptoms worsen.    Jacqulyn Bath, PA-C 05/10/2012 10:09 PM

## 2012-05-11 ENCOUNTER — Telehealth: Payer: Self-pay | Admitting: Cardiology

## 2012-05-11 NOTE — Telephone Encounter (Signed)
HANDS ARE RED AND ITCHY, THINKS SHE'S HAVING ALLERGIC REACTION, PLS CALL [phone]

## 2012-05-11 NOTE — Telephone Encounter (Signed)
C/o 2 episodes of rash on hands and going up her arms. Pt has had this in the past but not lately and has had it happen 2 x in 2 weeks. Pt took prednisone 40 mg and 2 benadryl tablets 30 minutes ago. Pt is in process of establishing with pcp. Told her  if symptoms worsen to go to urgent care/ pt declined at this point to go. Told her I would pass msg along to Dr/nurse, pt agreed to plan.

## 2012-05-13 DIAGNOSIS — Q062 Diastematomyelia: Secondary | ICD-10-CM | POA: Diagnosis not present

## 2012-05-13 DIAGNOSIS — M159 Polyosteoarthritis, unspecified: Secondary | ICD-10-CM | POA: Diagnosis not present

## 2012-05-13 DIAGNOSIS — M4150 Other secondary scoliosis, site unspecified: Secondary | ICD-10-CM | POA: Diagnosis not present

## 2012-05-13 DIAGNOSIS — M47817 Spondylosis without myelopathy or radiculopathy, lumbosacral region: Secondary | ICD-10-CM | POA: Diagnosis not present

## 2012-05-13 NOTE — Telephone Encounter (Signed)
2 reactions recently and had to go on Prednisone.  Off Afrin blood pressure goes up and but a struggle for her to get up and go.    States she is feeling some better.

## 2012-05-16 ENCOUNTER — Ambulatory Visit (INDEPENDENT_AMBULATORY_CARE_PROVIDER_SITE_OTHER): Payer: Medicare Other | Admitting: Family Medicine

## 2012-05-16 VITALS — BP 145/73 | HR 55 | Temp 98.7°F | Resp 16 | Ht 59.0 in | Wt 174.0 lb

## 2012-05-16 DIAGNOSIS — R350 Frequency of micturition: Secondary | ICD-10-CM | POA: Diagnosis not present

## 2012-05-16 LAB — POCT UA - MICROSCOPIC ONLY
Casts, Ur, LPF, POC: NEGATIVE
Crystals, Ur, HPF, POC: NEGATIVE
Yeast, UA: NEGATIVE

## 2012-05-16 LAB — POCT URINALYSIS DIPSTICK
Blood, UA: NEGATIVE
Protein, UA: NEGATIVE
Spec Grav, UA: 1.015
Urobilinogen, UA: 0.2

## 2012-05-16 MED ORDER — CIPROFLOXACIN HCL 250 MG PO TABS: 250.0000 mg | ORAL_TABLET | Freq: Two times a day (BID) | ORAL | Status: AC

## 2012-05-16 NOTE — Progress Notes (Signed)
78 yo woman with dysuria, frequency Has spinal problem from birth. 2 children  Objective: Results for orders placed in visit on 05/16/12  POCT URINALYSIS DIPSTICK      Component Value Range   Color, UA yellow     Clarity, UA clear     Glucose, UA neg     Bilirubin, UA neg     Ketones, UA neg     Spec Grav, UA 1.015     Blood, UA neg     pH, UA 7.0     Protein, UA neg     Urobilinogen, UA 0.2     Nitrite, UA neg     Leukocytes, UA small (1+)    POCT UA - MICROSCOPIC ONLY      Component Value Range   WBC, Ur, HPF, POC tntc     RBC, urine, microscopic tntc     Bacteria, U Microscopic 2+     Mucus, UA pos     Epithelial cells, urine per micros 0-1     Crystals, Ur, HPF, POC neg     Casts, Ur, LPF, POC neg     Yeast, UA neg     Assessment:  UTI uncomplicated  Plan:  Cipro 500 bid x 5 days Urine culture

## 2012-05-17 ENCOUNTER — Other Ambulatory Visit: Payer: Self-pay | Admitting: Cardiology

## 2012-05-19 LAB — URINE CULTURE: Colony Count: >=100000

## 2012-05-20 ENCOUNTER — Ambulatory Visit (INDEPENDENT_AMBULATORY_CARE_PROVIDER_SITE_OTHER): Payer: Medicare Other | Admitting: Internal Medicine

## 2012-05-20 ENCOUNTER — Encounter: Payer: Self-pay | Admitting: Internal Medicine

## 2012-05-20 VITALS — BP 120/72 | HR 57 | Temp 97.4°F | Ht 59.0 in | Wt 175.4 lb

## 2012-05-20 DIAGNOSIS — E249 Cushing's syndrome, unspecified: Secondary | ICD-10-CM | POA: Diagnosis not present

## 2012-05-20 DIAGNOSIS — Z23 Encounter for immunization: Secondary | ICD-10-CM | POA: Diagnosis not present

## 2012-05-20 DIAGNOSIS — Q057 Lumbar spina bifida without hydrocephalus: Secondary | ICD-10-CM | POA: Diagnosis not present

## 2012-05-20 DIAGNOSIS — I119 Hypertensive heart disease without heart failure: Secondary | ICD-10-CM | POA: Diagnosis not present

## 2012-05-20 DIAGNOSIS — I259 Chronic ischemic heart disease, unspecified: Secondary | ICD-10-CM | POA: Diagnosis not present

## 2012-05-20 DIAGNOSIS — J309 Allergic rhinitis, unspecified: Secondary | ICD-10-CM | POA: Insufficient documentation

## 2012-05-20 MED ORDER — PREDNISONE 20 MG PO TABS: 20.0000 mg | ORAL_TABLET | Freq: Every day | ORAL | Status: DC

## 2012-05-20 NOTE — Progress Notes (Signed)
Subjective:    Patient ID: Vanessa Nielsen, female    DOB: 1934/08/28, 78 y.o.   MRN: [id number]  HPI  New patient to me, here to establish care - prev PCP with [facility] who now focus on cards Reviewed chronic medical issues:  Scoliosis, spina bifida - multiple surgical interventions over lifetime - associated with chronic low back pain and LLE distal weakness - largely WC dep since 2010. Follows with various back specialists and pain mgmt ([facility])  Chronic GI issues - extreme diarrhea with abdominal cramping - inhibits activities out of the home - s/p local GI eval with normal results (no disease causing problems) - pending eval with GI specialist at [facility]. ?consideration of autonomic dysfunction as related to other nervous system dz   CAD - 2v CABG via intercostal approach in [place] - follows with cards for same - med mgmt ongoing, no chest pain or anginal symptoms   Cushing's syndrome - iatrogenic - follows with endo for same -  "allergic reaction" history - when in pain, pt will experience swelling of UE, face and torso associated with nausea and followed by hives, then hypotension and unconsciousness - only mgmt is urgent administration of high dose steroids - s/p eval by allergist and 2 endo but never during episode of crisis  Past Medical History  Diagnosis Date  . Hyperlipidemia   . Hypertension   . Coronary artery disease     2v CABG @ [place]  . Scoliosis   . Spina bifida   . Osteoarthritis   . Iatrogenic Cushing's disease   . Hypercholesteremia   . Allergic rhinitis   . Multiple allergies     induced by pain: swelling in UE/face/torso, nausea, hives followed by hypotension unless tx with high dose steroids    Review of Systems  Constitutional: Positive for fatigue. Negative for fever and unexpected weight change.  Respiratory: Negative for cough and shortness of breath.   Cardiovascular: Negative for chest pain and leg swelling.  Musculoskeletal: Positive  for back pain and gait problem. Negative for joint swelling.       Objective:   Physical Exam  BP 120/72  Pulse 57  Temp 97.4 F (36.3 C) (Oral)  Ht 4\' 11"  (1.499 m)  Wt 175 lb 6.4 oz (79.561 kg)  BMI 35.43 kg/m2  SpO2 95% Wt Readings from Last 3 Encounters:  05/20/12 175 lb 6.4 oz (79.561 kg)  05/16/12 174 lb (78.926 kg)  03/02/12 176 lb 1.9 oz (79.888 kg)   Constitutional: She is overweight, sitting in WC -appears well-developed and well-nourished. No distress. spouse at side Eyes: Conjunctivae and EOM are normal. Pupils are equal, round, and reactive to light. No scleral icterus.  Neck:  Thick. Normal range of motion. Neck supple. No JVD present. No thyromegaly present.  Cardiovascular: Normal rate, regular rhythm and normal heart sounds.  2/6 murmur heard. No BLE edema. Pulmonary/Chest: Effort normal and breath sounds normal. No respiratory distress. She has no wheezes.  Abdominal: Soft. Bowel sounds are normal. She exhibits no distension. There is no tenderness. no masses Musculoskeletal: marked scoli - no other gross deformities. no joint effusions.  Neurological: She is alert and oriented to person, place, and time. No cranial nerve deficit. No dysarthria or aphagia. Balance and gait not tested. Coordination of UE normal.  Skin: Skin is warm and dry. No rash noted. No erythema.  Psychiatric: She has a normal mood and affect. Her behavior is normal. Judgment and thought content normal.  Lab Results  Component Value Date   WBC 19.6* 10/17/2010   HGB 10.4* 10/17/2010   HCT 30.6* 10/17/2010   PLT 249 10/17/2010   GLUCOSE 109* 02/24/2012   CHOL 187 02/24/2012   TRIG 142.0 02/24/2012   HDL 47.80 02/24/2012   LDLDIRECT 144.4 02/12/2011   LDLCALC 111* 02/24/2012   ALT 8 02/24/2012   AST 16 02/24/2012   NA 135 02/24/2012   K 3.8 02/24/2012   CL 98 02/24/2012   CREATININE 0.8 02/24/2012   BUN 15 02/24/2012   CO2 28 02/24/2012   INR 2.03* 10/17/2010        Assessment & Plan:  See  problem list. Medications and labs reviewed today.  Time spent with pt/family today 35 minutes, greater than 50% time spent counseling patient on spine problems, cushings and pain allergy and medication review. Also review of prior records brought with pt today

## 2012-05-20 NOTE — Assessment & Plan Note (Signed)
Significant scoli hx - multiple surgical interventions over lifetime Follows with various spine specialists ([facility]onski/[facility]) as well as [facility] for pain mgmt Chronic LBP and weakness in distal LLE - WC dependant since 2010 Hx and mgmt reviewed - no changes recommended

## 2012-05-20 NOTE — Assessment & Plan Note (Signed)
Labile blood pressure - ?component of autonomic dysfunction Working with cards on same The current medical regimen is effective;  continue present plan and medications.

## 2012-05-20 NOTE — Assessment & Plan Note (Signed)
Iatrogenic - on hydrocortisone and follows with endo ([facility]) for same ?crisis reaction precipitated by stress of pain: "allergic reaction" to pain with nausea, hives and hypotension in past Hx reviewed The current medical regimen is effective;  continue present plan and medications.

## 2012-05-20 NOTE — Patient Instructions (Addendum)
It was good to see you today. We have reviewed your prior records including labs and tests today Medications reviewed, no changes at this time. continue working with your other specialists as reviewed today Please schedule followup in 6 months for review, call sooner if problems. Flu shot given today

## 2012-05-24 DIAGNOSIS — E249 Cushing's syndrome, unspecified: Secondary | ICD-10-CM | POA: Diagnosis not present

## 2012-05-26 DIAGNOSIS — K209 Esophagitis, unspecified without bleeding: Secondary | ICD-10-CM | POA: Diagnosis not present

## 2012-05-26 DIAGNOSIS — K449 Diaphragmatic hernia without obstruction or gangrene: Secondary | ICD-10-CM | POA: Diagnosis not present

## 2012-05-26 DIAGNOSIS — K5900 Constipation, unspecified: Secondary | ICD-10-CM | POA: Diagnosis not present

## 2012-05-26 DIAGNOSIS — K2970 Gastritis, unspecified, without bleeding: Secondary | ICD-10-CM | POA: Diagnosis not present

## 2012-05-26 DIAGNOSIS — R143 Flatulence: Secondary | ICD-10-CM | POA: Diagnosis not present

## 2012-05-26 DIAGNOSIS — K219 Gastro-esophageal reflux disease without esophagitis: Secondary | ICD-10-CM | POA: Diagnosis not present

## 2012-05-26 DIAGNOSIS — R110 Nausea: Secondary | ICD-10-CM | POA: Diagnosis not present

## 2012-05-26 DIAGNOSIS — R141 Gas pain: Secondary | ICD-10-CM | POA: Diagnosis not present

## 2012-05-26 DIAGNOSIS — R142 Eructation: Secondary | ICD-10-CM | POA: Diagnosis not present

## 2012-05-26 DIAGNOSIS — K2990 Gastroduodenitis, unspecified, without bleeding: Secondary | ICD-10-CM | POA: Diagnosis not present

## 2012-06-03 ENCOUNTER — Other Ambulatory Visit: Payer: Self-pay | Admitting: Cardiology

## 2012-06-03 DIAGNOSIS — Z1231 Encounter for screening mammogram for malignant neoplasm of breast: Secondary | ICD-10-CM

## 2012-06-07 DIAGNOSIS — M19019 Primary osteoarthritis, unspecified shoulder: Secondary | ICD-10-CM | POA: Diagnosis not present

## 2012-06-08 ENCOUNTER — Other Ambulatory Visit: Payer: Self-pay | Admitting: Cardiology

## 2012-06-08 MED ORDER — METOPROLOL TARTRATE 50 MG PO TABS: 50.0000 mg | ORAL_TABLET | Freq: Three times a day (TID) | ORAL | Status: DC

## 2012-06-08 NOTE — Telephone Encounter (Signed)
Pt needs a refill of lopressor sent in with a new Rx because she was on 2 a day and at last visit it was increased to 3 a day and Pharm dose not have this information.

## 2012-06-10 DIAGNOSIS — R159 Full incontinence of feces: Secondary | ICD-10-CM | POA: Diagnosis not present

## 2012-06-10 DIAGNOSIS — R110 Nausea: Secondary | ICD-10-CM | POA: Diagnosis not present

## 2012-06-10 DIAGNOSIS — K5900 Constipation, unspecified: Secondary | ICD-10-CM | POA: Diagnosis not present

## 2012-06-28 ENCOUNTER — Telehealth: Payer: Self-pay

## 2012-06-28 NOTE — Telephone Encounter (Signed)
[facility] Triage Call Report Triage Record Num: [id number] Operator: Claudie Leach Patient Name: Vanessa Nielsen Call Date & Time: 06/26/2012 9:00:16PM Patient Phone: [phone] PCP: Rene Paci Patient Gender: Female PCP Fax : [phone] Patient DOB: [birth year] Practice Name: [facility] Reason for Call: Caller: Bill/Spouse; PCP: Rene Paci (Adults only); CB#: [phone]; Call regarding being nauseated that started at 2 or 3 pm on 06/26/12. Started with headache on 06/26/12 and caller states it hurts really bad if she strains to go to the bathroom. Caller states the patient took Tramadol 50 mg at 4 pm on 06/26/12 and did not help the headache. Triaged per headache guideline. To see provider within 24 hours due to headache associated with or made worse by coughing, straining at stool, exertion or sexual activity. Care advice given. Caller states if the patient still has a headache she will go to urgent care on 06/27/12. Caller states the nausea is worse than the headache. Triaged per Nausea or Vomiting guideline. Home care advice given. Per standing order prescription for Phenergan 25 mg tablets to take 1 q4h prn. Dispense 6 tablets with no refills [facility] on [address] Number [phone]. Caller made aware and agreed. Protocol(s) Used: Headache Recommended Outcome per Protocol: See Provider within 24 hours Reason for Outcome: Headache associated with or made worse by coughing, straining at stool, exertion or sexual activity Care Advice: ~ Do not drink alcoholic beverages or use tobacco products. ~ SYMPTOM / CONDITION MANAGEMENT ~ CAUTIONS ~ Call provider immediately for severe headache that lasts more than 24 hours. ~ Avoid exertional activity while having a headache until evaluated. Most adults need to drink 6-10 eight-ounce glasses (1.2-2.0 liters) of fluids per day unless previously told to limit fluid intake for other medical reasons. Limit fluids  that contain caffeine, sugar or alcohol. Urine will be a very light yellow color when you drink enough fluids. ~ Call EMS 911 if having a sudden, severe disabling headache OR if the person spontaneously verbalized this headache is "the worst headache of my life." ~ 06/26/2012 9:30:55PM Page 1 of 1 [id number]

## 2012-06-30 ENCOUNTER — Ambulatory Visit: Payer: Medicare Other

## 2012-07-01 ENCOUNTER — Other Ambulatory Visit: Payer: Medicare Other

## 2012-07-01 ENCOUNTER — Telehealth: Payer: Self-pay | Admitting: Internal Medicine

## 2012-07-01 MED ORDER — PROMETHAZINE HCL 25 MG PO TABS: 25.0000 mg | ORAL_TABLET | Freq: Four times a day (QID) | ORAL | Status: DC | PRN

## 2012-07-01 NOTE — Telephone Encounter (Signed)
Please review in VAL's absence, thanks!

## 2012-07-01 NOTE — Telephone Encounter (Signed)
Called informed the patient and she stated po if ok with her.

## 2012-07-01 NOTE — Telephone Encounter (Incomplete)
Caller: Holly/Daughter; Patient Name: Oretha Ellis; PCP: Rene Paci (Adults only); Best Callback Phone Number: [phone] GI Symptoms for many years, nausea is the worst symptom.  Sometimes  lot of discomfort and pain in lower Gi  due to gas.  Had been doing as usual until yesterday 10/16  and nausea increased aobut 5 pm.  Has not vomited since July but did have 2 loose stools since nausea worsened.  Is getting tests at [facility] for nausea but not making progress well.  Not eating well, but is taking fluids.  Has urinated in the last 12 hours.  Has tried the Zofran a few times but does not seem to help.  Triaged in Nausea or Vomiting Guideline - Disposition:  Provide Home Care due to All Other Situations.   ***Requesting Phenergan to be called in to try since has had the most success with that medicine in the past.  [facility]ses [facility] [phone].  Asking for contact after medicine called in and as needed  [phone].

## 2012-07-01 NOTE — Telephone Encounter (Signed)
Phenergan refilled  Please let pt/family know there is the option of phenergan supp's as well if not able to take po well

## 2012-07-02 ENCOUNTER — Telehealth: Payer: Self-pay | Admitting: Cardiology

## 2012-07-02 NOTE — Telephone Encounter (Signed)
Advised patient ok to come Monday for appointment even though she was unable to get labs done

## 2012-07-02 NOTE — Telephone Encounter (Signed)
Plz return call to pt at [facility]#, she has questions about upcoming appnt as she missed her labs on 10/17

## 2012-07-05 ENCOUNTER — Ambulatory Visit (INDEPENDENT_AMBULATORY_CARE_PROVIDER_SITE_OTHER): Payer: Medicare Other | Admitting: Cardiology

## 2012-07-05 ENCOUNTER — Encounter: Payer: Self-pay | Admitting: Cardiology

## 2012-07-05 VITALS — BP 149/74 | HR 64 | Ht 59.0 in | Wt 167.0 lb

## 2012-07-05 DIAGNOSIS — K589 Irritable bowel syndrome without diarrhea: Secondary | ICD-10-CM | POA: Diagnosis not present

## 2012-07-05 DIAGNOSIS — E7800 Pure hypercholesterolemia, unspecified: Secondary | ICD-10-CM

## 2012-07-05 DIAGNOSIS — I119 Hypertensive heart disease without heart failure: Secondary | ICD-10-CM

## 2012-07-05 DIAGNOSIS — I259 Chronic ischemic heart disease, unspecified: Secondary | ICD-10-CM

## 2012-07-05 NOTE — Assessment & Plan Note (Signed)
Patient has been having spikes of her blood pressure at night.  She has been taking extra Lopressor on a when necessary basis but her pulse tends to be slow.  We are going to have her try increasing her amlodipine up to a dose of 5 mg twice a day to control her systolic blood pressure better at night

## 2012-07-05 NOTE — Patient Instructions (Addendum)
Return Wednesday 07/07/12 for fasting labs  Your physician recommends that you schedule a follow-up appointment in: 4 months with fasting labs (lp/bmet/hfp)   Increase your Amlodipine to 5 mg twice a day

## 2012-07-05 NOTE — Assessment & Plan Note (Signed)
The patient has hypercholesterolemia but was unable to make her appointment for blood work earlier this week because of her problems with diarrhea.  We will have her return next week for fasting lab work.  We will also check a CBC because of her complaints of increasing malaise.

## 2012-07-05 NOTE — Progress Notes (Signed)
Vanessa Nielsen Date of Birth:  17-Jul-1934 [facility] [address] [place], [place]  [address] [phone]         Fax   [phone]  History of Present Illness: This pleasant 78 year old woman is seen back for a four-month followup office visit. She has a past history of ischemic heart disease. She has a long history of hypertensive cardiovascular disease and hypercholesterolemia. She has had previous bypass graft surgery in [place].  She has multiple orthopedic issues. She has spina bifida and scoliosis. She also has had a lot of GI issues with severe constipation alternating with explosive diarrhea and fecal incontinence. She ambulates with difficulty and is in a wheelchair most of the time. When she does walk she uses a walker. She's had a lot of problems with nasal stuffiness and difficulty breathing at night.  Since last visit she has continued to be involved with multiple systems disease.  She feels that at the present time her gastrointestinal problems are her main problem.  She has been seeing a gastroenterologist at [facility].  Her nausea is better controlled with Phenergan in the previously used Zofran.   Current Outpatient Prescriptions  Medication Sig Dispense Refill  . AMITIZA 8 MCG capsule 1 tab daily occ      . amLODipine (NORVASC) 5 MG tablet Take 5 mg by mouth 2 (two) times daily.       Marland Kitchen aspirin 81 MG EC tablet Take 81 mg by mouth daily.        . diazepam (VALIUM) 5 MG tablet Take 1 tablet (5 mg total) by mouth daily as needed.  90 tablet  0  . docusate sodium (COLACE) 100 MG capsule Take 100 mg by mouth 2 (two) times daily.       Marland Kitchen esomeprazole (NEXIUM) 40 MG capsule Take 40 mg by mouth daily before breakfast.      . fluticasone (FLONASE) 50 MCG/ACT nasal spray Place 2 sprays into the nose daily.  1 g  3  . hydrocortisone (CORTEF) 10 MG tablet Take 10 mg by mouth as directed.      . lidocaine (LIDODERM) 5 % Place 1 patch onto the  skin daily. Remove & Discard patch within 12 hours or as directed by MD      . LIPITOR 10 MG tablet TAKE 1 TABLET EVERY DAY  90 tablet  3  . Loperamide HCl (IMODIUM A-D PO) Take by mouth. As needed      . losartan-hydrochlorothiazide (HYZAAR) 100-25 MG per tablet Taking 1/2 daily      . metoprolol (LOPRESSOR) 50 MG tablet Take 1 tablet (50 mg total) by mouth 3 (three) times daily.  270 tablet  3  . nitroGLYCERIN (NITROSTAT) 0.4 MG SL tablet Place 0.4 mg under the tongue every 5 (five) minutes as needed.        . ondansetron (ZOFRAN) 8 MG tablet Take by mouth every 8 (eight) hours as needed.      . polyethylene glycol (MIRALAX / GLYCOLAX) packet Take 17 g by mouth 2 (two) times daily.      . potassium chloride (K-DUR) 10 MEQ tablet 1 tab twice a day      . Probiotic Product (ALIGN PO) Take by mouth daily.      . promethazine (PHENERGAN) 25 MG tablet Take 1 tablet (25 mg total) by mouth every 6 (six) hours as needed.  30 tablet  1  . pseudoephedrine (SUDAFED) 120 MG 12 hr tablet Take  60 mg by mouth every 12 (twelve) hours.        . simethicone (MYLICON) 80 MG chewable tablet Chew 80 mg by mouth every 6 (six) hours as needed. Gas x as needed      . DISCONTD: amLODipine (NORVASC) 5 MG tablet TAKE 1 TABLET (5 MG TOTAL) BY MOUTH DAILY.  180 tablet  1  . DISCONTD: losartan-hydrochlorothiazide (HYZAAR) 100-25 MG per tablet TAKE 1 TABLET EVERY DAY  90 tablet  2  . DISCONTD: potassium chloride (K-DUR) 10 MEQ tablet 2 twice daily  360 tablet  3    Allergies  Allergen Reactions  . Cardura (Doxazosin Mesylate)   . Clarithromycin   . Erythromycin   . Guaifenesin   . Ketorolac Tromethamine     Patient Active Problem List  Diagnosis  . Ischemic heart disease  . Benign hypertensive heart disease without heart failure  . Hypercholesterolemia  . Spina bifida of lumbar spine  . Constipation  . Cushing syndrome  . Allergic rhinitis    History  Smoking status  . Never Smoker   Smokeless tobacco    . Not on file  Comment: married, lives with spouse. 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 Cancer Father 15    colon  . Arthritis Father   . Arthritis Mother   . Heart disease Other   . Hypertension Other     Review of Systems: Constitutional: no fever chills diaphoresis or fatigue or change in weight.  Head and neck: no hearing loss, no epistaxis, no photophobia or visual disturbance. Respiratory: No cough, shortness of breath or wheezing. Cardiovascular: No chest pain peripheral edema, palpitations. Gastrointestinal: No abdominal distention, no abdominal pain, no change in bowel habits hematochezia or melena. Genitourinary: No dysuria, no frequency, no urgency, no nocturia. Musculoskeletal:No arthralgias, no back pain, no gait disturbance or myalgias. Neurological: No dizziness, no headaches, no numbness, no seizures, no syncope, no weakness, no tremors. Hematologic: No lymphadenopathy, no easy bruising. Psychiatric: No confusion, no hallucinations, no sleep disturbance.    Physical Exam: Filed Vitals:   07/05/12 1455  BP: 149/74  Pulse: 64   the general appearance reveals a stocky woman in a wheelchair.  She is in no acute distress.Pupils equal and reactive.   Extraocular Movements are full.  There is no scleral icterus.  The mouth and pharynx are normal.  The neck is supple.  The carotids reveal no bruits.  The jugular venous pressure is normal.  The thyroid is not enlarged.  There is no lymphadenopathy.  The chest is clear to percussion and auscultation. There are no rales or rhonchi. Expansion of the chest is symmetrical.  The precordium is quiet.  The first heart sound is normal.  The second heart sound is physiologically split.  There is no murmur gallop rub or click.  There is no abnormal lift or heave.  Abdomen reveals active bowel sounds and no  mass. Extremities reveal mild chronic edema.  Neurologic reveals evidence of previous spina bifida occulta severe scoliosis.   Assessment / Plan: The patient is to continue same medication except for increasing dose of amlodipine to 5 mg twice a day.  Return next week for fasting lab work.  Return in 4 months for followup office visit and lab work.

## 2012-07-07 ENCOUNTER — Telehealth: Payer: Self-pay | Admitting: Cardiology

## 2012-07-07 NOTE — Telephone Encounter (Signed)
Left message to call back  

## 2012-07-07 NOTE — Telephone Encounter (Signed)
Advised patient

## 2012-07-07 NOTE — Telephone Encounter (Signed)
Pt calling back to give you another number to reach her [phone]

## 2012-07-07 NOTE — Telephone Encounter (Signed)
If the heart rate is not low she could also take an extra Lopressor

## 2012-07-07 NOTE — Telephone Encounter (Signed)
Tuesday night blood pressure 180/80 heart rate 40's she took an Amodipine Last night 172/75 and heart rate 76 took Lopressor  This am 145/68 heart rate 64  Patient is still very concerned about blood pressure going up in the middle of the night.  Is it ok for her to take an extra Lopressor if her heart rate is not low.  If heart rate is low what should she take. Will forward to  Dr. Patty Sermons for reveiw

## 2012-07-07 NOTE — Telephone Encounter (Signed)
New problem:  C/o high blood pressure last night.

## 2012-07-12 DIAGNOSIS — M4150 Other secondary scoliosis, site unspecified: Secondary | ICD-10-CM | POA: Diagnosis not present

## 2012-07-12 DIAGNOSIS — Q062 Diastematomyelia: Secondary | ICD-10-CM | POA: Diagnosis not present

## 2012-07-12 DIAGNOSIS — M47817 Spondylosis without myelopathy or radiculopathy, lumbosacral region: Secondary | ICD-10-CM | POA: Diagnosis not present

## 2012-07-14 ENCOUNTER — Other Ambulatory Visit (INDEPENDENT_AMBULATORY_CARE_PROVIDER_SITE_OTHER): Payer: Medicare Other

## 2012-07-14 DIAGNOSIS — I119 Hypertensive heart disease without heart failure: Secondary | ICD-10-CM

## 2012-07-14 DIAGNOSIS — I2510 Atherosclerotic heart disease of native coronary artery without angina pectoris: Secondary | ICD-10-CM | POA: Diagnosis not present

## 2012-07-14 DIAGNOSIS — K589 Irritable bowel syndrome without diarrhea: Secondary | ICD-10-CM

## 2012-07-14 LAB — LIPID PANEL
HDL: 53.6 mg/dL (ref >39.00)
LDL Cholesterol: 103 mg/dL — ABNORMAL HIGH (ref 0–99)
Total CHOL/HDL Ratio: 3
Triglycerides: 124 mg/dL (ref 0.0–149.0)
VLDL: 24.8 mg/dL (ref 0.0–40.0)

## 2012-07-14 LAB — CBC WITH DIFFERENTIAL/PLATELET
Basophils Relative: 0.8 % (ref 0.0–3.0)
Eosinophils Absolute: 0.2 10*3/uL (ref 0.0–0.7)
Hemoglobin: 13.6 g/dL (ref 12.0–15.0)
Lymphocytes Relative: 18.8 % (ref 12.0–46.0)
MCHC: 33 g/dL (ref 30.0–36.0)
MCV: 86.9 fl (ref 78.0–100.0)
Monocytes Absolute: 0.7 10*3/uL (ref 0.1–1.0)
Neutro Abs: 5.6 10*3/uL (ref 1.4–7.7)
RBC: 4.74 Mil/uL (ref 3.87–5.11)

## 2012-07-14 LAB — BASIC METABOLIC PANEL
CO2: 30 mEq/L (ref 19–32)
Chloride: 97 mEq/L (ref 96–112)
Potassium: 3.5 mEq/L (ref 3.5–5.1)
Sodium: 135 mEq/L (ref 135–145)

## 2012-07-14 LAB — HEPATIC FUNCTION PANEL: Albumin: 3.1 g/dL — ABNORMAL LOW (ref 3.5–5.2)

## 2012-07-14 NOTE — Progress Notes (Signed)
Quick Note:  Please report to patient. The recent labs are stable. Continue same medication and careful diet. Hgb is back up to normal. ______

## 2012-07-16 ENCOUNTER — Telehealth: Payer: Self-pay | Admitting: *Deleted

## 2012-07-16 NOTE — Telephone Encounter (Signed)
Message copied by Burnell Blanks on Fri Jul 16, 2012  2:46 PM ------      Message from: Cassell Clement      Created: Wed Jul 14, 2012  4:05 PM       Please report to patient.  The recent labs are stable. Continue same medication and careful diet. Hgb is back up to normal.

## 2012-07-16 NOTE — Telephone Encounter (Signed)
Left message for patient to call back  

## 2012-07-20 ENCOUNTER — Ambulatory Visit (INDEPENDENT_AMBULATORY_CARE_PROVIDER_SITE_OTHER): Payer: Medicare Other | Admitting: Internal Medicine

## 2012-07-20 VITALS — BP 138/78 | HR 64 | Temp 98.0°F | Resp 20 | Ht 59.0 in | Wt 169.0 lb

## 2012-07-20 DIAGNOSIS — N390 Urinary tract infection, site not specified: Secondary | ICD-10-CM | POA: Diagnosis not present

## 2012-07-20 DIAGNOSIS — R350 Frequency of micturition: Secondary | ICD-10-CM | POA: Diagnosis not present

## 2012-07-20 LAB — POCT URINALYSIS DIPSTICK
Spec Grav, UA: 1.015
Urobilinogen, UA: 0.2
pH, UA: 7

## 2012-07-20 LAB — POCT UA - MICROSCOPIC ONLY
Casts, Ur, LPF, POC: NEGATIVE
Crystals, Ur, HPF, POC: NEGATIVE

## 2012-07-20 MED ORDER — CIPROFLOXACIN HCL 500 MG PO TABS: 500.0000 mg | ORAL_TABLET | Freq: Two times a day (BID) | ORAL | Status: DC

## 2012-07-20 NOTE — Patient Instructions (Addendum)
Urinary Tract Infection Urinary tract infections (UTIs) can develop anywhere along your urinary tract. Your urinary tract is your body's drainage system for removing wastes and extra water. Your urinary tract includes two kidneys, two ureters, a bladder, and a urethra. Your kidneys are a pair of bean-shaped organs. Each kidney is about the size of your fist. They are located below your ribs, one on each side of your spine. CAUSES Infections are caused by microbes, which are microscopic organisms, including fungi, viruses, and bacteria. These organisms are so small that they can only be seen through a microscope. Bacteria are the microbes that most commonly cause UTIs. SYMPTOMS  Symptoms of UTIs may vary by age and gender of the patient and by the location of the infection. Symptoms in young women typically include a frequent and intense urge to urinate and a painful, burning feeling in the bladder or urethra during urination. Older women and men are more likely to be tired, shaky, and weak and have muscle aches and abdominal pain. A fever may mean the infection is in your kidneys. Other symptoms of a kidney infection include pain in your back or sides below the ribs, nausea, and vomiting. DIAGNOSIS To diagnose a UTI, your caregiver will ask you about your symptoms. Your caregiver also will ask to provide a urine sample. The urine sample will be tested for bacteria and white blood cells. White blood cells are made by your body to help fight infection. TREATMENT  Typically, UTIs can be treated with medication. Because most UTIs are caused by a bacterial infection, they usually can be treated with the use of antibiotics. The choice of antibiotic and length of treatment depend on your symptoms and the type of bacteria causing your infection. HOME CARE INSTRUCTIONS  If you were prescribed antibiotics, take them exactly as your caregiver instructs you. Finish the medication even if you feel better after you  have only taken some of the medication.  Drink enough water and fluids to keep your urine clear or pale yellow.  Avoid caffeine, tea, and carbonated beverages. They tend to irritate your bladder.  Empty your bladder often. Avoid holding urine for long periods of time.  Empty your bladder before and after sexual intercourse.  After a bowel movement, women should cleanse from front to back. Use each tissue only once. SEEK MEDICAL CARE IF:   You have back pain.  You develop a fever.  Your symptoms do not begin to resolve within 3 days. SEEK IMMEDIATE MEDICAL CARE IF:   You have severe back pain or lower abdominal pain.  You develop chills.  You have nausea or vomiting.  You have continued burning or discomfort with urination. MAKE SURE YOU:   Understand these instructions.  Will watch your condition.  Will get help right away if you are not doing well or get worse. Document Released: 06/11/2005 Document Revised: 03/02/2012 Document Reviewed: 10/10/2011 [employer] Patient Information 2013 [employer].  

## 2012-07-20 NOTE — Progress Notes (Signed)
  Subjective:    Patient ID: Vanessa Nielsen, female    DOB: Jan 14, 1934, 78 y.o.   MRN: [id number]  HPI  1day freq and urgency, has hx of utis No fever, flank pain , nausea. Has spinal problems in wheel chair.  Review of Systems     Objective:   Physical Exam No tenderness of abdomen   Results for orders placed in visit on 07/20/12  POCT UA - MICROSCOPIC ONLY      Component Value Range   WBC, Ur, HPF, POC 1-6     RBC, urine, microscopic 0-1     Bacteria, U Microscopic trace     Mucus, UA trace     Epithelial cells, urine per micros 2-6     Crystals, Ur, HPF, POC neg     Casts, Ur, LPF, POC neg     Yeast, UA neg    POCT URINALYSIS DIPSTICK      Component Value Range   Color, UA yellow     Clarity, UA clear     Glucose, UA neg     Bilirubin, UA small     Ketones, UA 15mg      Spec Grav, UA 1.015     Blood, UA neg     pH, UA 7.0     Protein, UA 30mg      Urobilinogen, UA 0.2     Nitrite, UA neg     Leukocytes, UA Trace         Assessment & Plan:  cipro 500mg  bid

## 2012-07-20 NOTE — Addendum Note (Signed)
Addended by: Gerrianne Scale on: 07/20/2012 05:26 PM   Modules accepted: Orders

## 2012-07-20 NOTE — Addendum Note (Signed)
Addended by: Gerrianne Scale on: 07/20/2012 05:24 PM   Modules accepted: Orders

## 2012-07-21 DIAGNOSIS — R110 Nausea: Secondary | ICD-10-CM | POA: Diagnosis not present

## 2012-07-21 DIAGNOSIS — R143 Flatulence: Secondary | ICD-10-CM | POA: Diagnosis not present

## 2012-07-21 DIAGNOSIS — R141 Gas pain: Secondary | ICD-10-CM | POA: Diagnosis not present

## 2012-07-22 ENCOUNTER — Telehealth: Payer: Self-pay | Admitting: Cardiology

## 2012-07-22 LAB — URINE CULTURE: Colony Count: 40000

## 2012-07-22 NOTE — Telephone Encounter (Signed)
Went over labs and mailed another copy to patient

## 2012-07-22 NOTE — Telephone Encounter (Signed)
New problem:   Status of lab results that suppose to be mail.

## 2012-07-27 DIAGNOSIS — H04129 Dry eye syndrome of unspecified lacrimal gland: Secondary | ICD-10-CM | POA: Diagnosis not present

## 2012-07-27 DIAGNOSIS — H2510 Age-related nuclear cataract, unspecified eye: Secondary | ICD-10-CM | POA: Diagnosis not present

## 2012-07-29 ENCOUNTER — Other Ambulatory Visit: Payer: Self-pay | Admitting: Cardiology

## 2012-07-29 DIAGNOSIS — F419 Anxiety disorder, unspecified: Secondary | ICD-10-CM

## 2012-07-29 MED ORDER — DIAZEPAM 5 MG PO TABS: 5.0000 mg | ORAL_TABLET | Freq: Every day | ORAL | Status: DC | PRN

## 2012-07-29 NOTE — Telephone Encounter (Signed)
New Problem:    Called in needing a refill of the patient's diazepam (VALIUM) 5 MG tablet.  Please call back.

## 2012-07-30 ENCOUNTER — Other Ambulatory Visit: Payer: Self-pay | Admitting: Cardiology

## 2012-07-30 DIAGNOSIS — F419 Anxiety disorder, unspecified: Secondary | ICD-10-CM

## 2012-07-30 MED ORDER — FLUTICASONE PROPIONATE 50 MCG/ACT NA SUSP: 2.0000 | Freq: Every day | NASAL | Status: DC

## 2012-07-30 MED ORDER — MECLIZINE HCL 25 MG PO TABS: 25.0000 mg | ORAL_TABLET | Freq: Three times a day (TID) | ORAL | Status: DC | PRN

## 2012-07-30 NOTE — Telephone Encounter (Signed)
New problem:    Refill medication meclizine 25 mg

## 2012-08-06 ENCOUNTER — Other Ambulatory Visit: Payer: Self-pay | Admitting: Internal Medicine

## 2012-08-09 ENCOUNTER — Ambulatory Visit (INDEPENDENT_AMBULATORY_CARE_PROVIDER_SITE_OTHER)
Admission: RE | Admit: 2012-08-09 | Discharge: 2012-08-09 | Disposition: A | Discharge status: Home / Self Care | Payer: BLUE CROSS/BLUE SHIELD | Source: Ambulatory Visit | Attending: Internal Medicine | Admitting: Internal Medicine

## 2012-08-09 ENCOUNTER — Ambulatory Visit (INDEPENDENT_AMBULATORY_CARE_PROVIDER_SITE_OTHER): Payer: Medicare Other | Admitting: Internal Medicine

## 2012-08-09 ENCOUNTER — Encounter: Payer: Self-pay | Admitting: Internal Medicine

## 2012-08-09 VITALS — BP 120/62 | HR 80 | Temp 97.3°F

## 2012-08-09 DIAGNOSIS — N390 Urinary tract infection, site not specified: Secondary | ICD-10-CM | POA: Diagnosis not present

## 2012-08-09 DIAGNOSIS — R0781 Pleurodynia: Secondary | ICD-10-CM

## 2012-08-09 DIAGNOSIS — R079 Chest pain, unspecified: Secondary | ICD-10-CM

## 2012-08-09 LAB — POCT URINALYSIS DIPSTICK
Ketones, UA: NEGATIVE
Protein, UA: NEGATIVE
Spec Grav, UA: <=1.005
Urobilinogen, UA: 0.2
pH, UA: 5

## 2012-08-09 NOTE — Progress Notes (Signed)
Subjective:    Patient ID: Vanessa Nielsen, female    DOB: 1934/06/15, 78 y.o.   MRN: [id number]  Flank Pain Pertinent negatives include no chest pain or fever.  Urinary Tract Infection  Associated symptoms include flank pain.   Also reviewed chronic medical issues:  Scoliosis, spina bifida - multiple surgical interventions over lifetime - associated with chronic low back pain and LLE distal weakness - largely WC dep since 2010. Follows with various back specialists and pain mgmt (phillips)  Chronic GI issues - extreme diarrhea with abdominal cramping - inhibits activities out of the home - s/p local GI eval with normal results (no disease causing problems) - pending eval with GI specialist at [facility] ?consideration of autonomic dysfunction as related to other nervous system dz   CAD - 2v CABG via intercostal approach in [place] - follows with cards for same - med mgmt ongoing, no chest pain or anginal symptoms   Cushing's syndrome - iatrogenic - follows with endo for same -  "allergic reaction" history - when in pain, pt will experience swelling of UE, face and torso associated with nausea and followed by hives, then hypotension and unconsciousness - only mgmt is urgent administration of high dose steroids - s/p eval by allergist and 2 endo but never during episode of crisis  Past Medical History  Diagnosis Date  . Hyperlipidemia   . Hypertension   . Coronary artery disease     2v CABG @ [place]  . Scoliosis   . Spina bifida   . Osteoarthritis   . Iatrogenic Cushing's disease   . Hypercholesteremia   . Allergic rhinitis   . Multiple allergies     induced by pain: swelling in UE/face/torso, nausea, hives followed by hypotension unless tx with high dose steroids    Review of Systems  Constitutional: Positive for fatigue. Negative for fever and unexpected weight change.  Respiratory: Negative for cough and shortness of breath.   Cardiovascular: Negative for chest pain and  leg swelling.  Genitourinary: Positive for flank pain.  Musculoskeletal: Positive for back pain and gait problem. Negative for joint swelling.       Objective:   Physical Exam  BP 120/62  Pulse 80  Temp 97.3 F (36.3 C) (Oral)  SpO2 91% Wt Readings from Last 3 Encounters:  07/20/12 169 lb (76.658 kg)  07/05/12 167 lb (75.751 kg)  05/20/12 175 lb 6.4 oz (79.561 kg)   Constitutional: She is overweight, sitting in WC -appears well-developed and well-nourished. No distress. spouse at side Neck:  Thick. Normal range of motion. Neck supple. No JVD present. No thyromegaly present.  Cardiovascular: Normal rate, regular rhythm and normal heart sounds.  2/6 murmur heard. No BLE edema. Pulmonary/Chest: Effort normal and breath sounds normal. No respiratory distress. She has no wheezes.  Abdominal: Soft. Bowel sounds are normal. She exhibits no distension. There is no tenderness. no masses Musculoskeletal: marked scoli - no other gross deformities. no joint effusions.  Neurological: She is alert and oriented to person, place, and time. No cranial nerve deficit. No dysarthria or aphagia. Balance and gait not tested. Coordination of UE normal.  Skin: Skin is warm and dry. No rash noted. No erythema.  Psychiatric: She has a slightly anxious mood and affect. Her behavior is normal. Judgment and thought content normal.   Lab Results  Component Value Date   WBC 8.0 07/14/2012   HGB 13.6 07/14/2012   HCT 41.2 07/14/2012   PLT 317.0 07/14/2012  GLUCOSE 104* 07/14/2012   CHOL 181 07/14/2012   TRIG 124.0 07/14/2012   HDL 53.60 07/14/2012   LDLDIRECT 144.4 02/12/2011   LDLCALC 103* 07/14/2012   ALT 8 07/14/2012   AST 16 07/14/2012   NA 135 07/14/2012   K 3.5 07/14/2012   CL 97 07/14/2012   CREATININE 0.8 07/14/2012   BUN 11 07/14/2012   CO2 30 07/14/2012   INR 2.03* 10/17/2010        Assessment & Plan:  Dysuria/urinary freq - recurrent UTIs with chronic diarrhea, but current POC neg -  send for Ucx and tx if growth, refer to uro if symptoms without infection  r rib pain - different than prior RUQ/GB pain - improves with lidoderm and heating pad - no shortness of breath, pleurisy or cough, but requests xray given symptoms > 6 weeks duration - imaging ordered, continue current therapy as ongoing

## 2012-08-09 NOTE — Patient Instructions (Signed)
It was good to see you today. We have reviewed your prior records including labs and tests today Test(s) ordered today. Your results will be released to MyChart (or called to you) after review, usually within 72hours after test completion. If any changes need to be made, you will be notified at that same time. Medications reviewed, no changes at this time.

## 2012-08-13 ENCOUNTER — Other Ambulatory Visit: Payer: Self-pay

## 2012-08-13 MED ORDER — ATORVASTATIN CALCIUM 10 MG PO TABS: 10.0000 mg | ORAL_TABLET | Freq: Every day | ORAL | Status: DC

## 2012-08-18 ENCOUNTER — Other Ambulatory Visit: Payer: Self-pay

## 2012-08-18 MED ORDER — ATORVASTATIN CALCIUM 10 MG PO TABS: 10.0000 mg | ORAL_TABLET | Freq: Every day | ORAL | Status: DC

## 2012-08-31 ENCOUNTER — Other Ambulatory Visit: Payer: Self-pay | Admitting: *Deleted

## 2012-08-31 MED ORDER — FLUTICASONE PROPIONATE 50 MCG/ACT NA SUSP: 2.0000 | Freq: Every day | NASAL | Status: DC

## 2012-09-13 DIAGNOSIS — H00029 Hordeolum internum unspecified eye, unspecified eyelid: Secondary | ICD-10-CM | POA: Diagnosis not present

## 2012-09-30 ENCOUNTER — Other Ambulatory Visit: Payer: Self-pay

## 2012-09-30 MED ORDER — AMLODIPINE BESYLATE 5 MG PO TABS: 5.0000 mg | ORAL_TABLET | Freq: Two times a day (BID) | ORAL | Status: DC

## 2012-10-25 DIAGNOSIS — R197 Diarrhea, unspecified: Secondary | ICD-10-CM | POA: Diagnosis not present

## 2012-10-25 DIAGNOSIS — R142 Eructation: Secondary | ICD-10-CM | POA: Diagnosis not present

## 2012-10-25 DIAGNOSIS — R634 Abnormal weight loss: Secondary | ICD-10-CM | POA: Diagnosis not present

## 2012-10-25 DIAGNOSIS — R109 Unspecified abdominal pain: Secondary | ICD-10-CM | POA: Diagnosis not present

## 2012-10-25 DIAGNOSIS — K5900 Constipation, unspecified: Secondary | ICD-10-CM | POA: Diagnosis not present

## 2012-10-25 DIAGNOSIS — R143 Flatulence: Secondary | ICD-10-CM | POA: Diagnosis not present

## 2012-10-25 DIAGNOSIS — R141 Gas pain: Secondary | ICD-10-CM | POA: Diagnosis not present

## 2012-11-02 ENCOUNTER — Other Ambulatory Visit (INDEPENDENT_AMBULATORY_CARE_PROVIDER_SITE_OTHER): Payer: Medicare Other

## 2012-11-02 DIAGNOSIS — I119 Hypertensive heart disease without heart failure: Secondary | ICD-10-CM

## 2012-11-02 DIAGNOSIS — E7800 Pure hypercholesterolemia, unspecified: Secondary | ICD-10-CM | POA: Diagnosis not present

## 2012-11-02 LAB — BASIC METABOLIC PANEL
BUN: 10 mg/dL (ref 6–23)
CO2: 29 mEq/L (ref 19–32)
Calcium: 8.9 mg/dL (ref 8.4–10.5)
Chloride: 100 mEq/L (ref 96–112)
Creatinine, Ser: 0.7 mg/dL (ref 0.4–1.2)
GFR: 86.41 mL/min (ref >60.00)
Glucose, Bld: 95 mg/dL (ref 70–99)
Potassium: 4.2 mEq/L (ref 3.5–5.1)
Sodium: 136 mEq/L (ref 135–145)

## 2012-11-02 LAB — HEPATIC FUNCTION PANEL
ALT: 10 U/L (ref 0–35)
AST: 14 U/L (ref 0–37)
Albumin: 3.1 g/dL — ABNORMAL LOW (ref 3.5–5.2)
Alkaline Phosphatase: 87 U/L (ref 39–117)
Bilirubin, Direct: 0.1 mg/dL (ref 0.0–0.3)
Total Bilirubin: 0.5 mg/dL (ref 0.3–1.2)
Total Protein: 6.7 g/dL (ref 6.0–8.3)

## 2012-11-02 LAB — LIPID PANEL
Cholesterol: 178 mg/dL (ref 0–200)
HDL: 46.4 mg/dL (ref >39.00)
LDL Cholesterol: 107 mg/dL — ABNORMAL HIGH (ref 0–99)
Total CHOL/HDL Ratio: 4
Triglycerides: 121 mg/dL (ref 0.0–149.0)
VLDL: 24.2 mg/dL (ref 0.0–40.0)

## 2012-11-02 NOTE — Progress Notes (Signed)
Quick Note:  Please make copy of labs for patient visit. ______ 

## 2012-11-04 ENCOUNTER — Ambulatory Visit: Payer: Medicare Other | Admitting: Cardiology

## 2012-11-10 DIAGNOSIS — K5900 Constipation, unspecified: Secondary | ICD-10-CM | POA: Diagnosis not present

## 2012-11-11 ENCOUNTER — Other Ambulatory Visit: Payer: Self-pay | Admitting: *Deleted

## 2012-11-11 ENCOUNTER — Telehealth: Payer: Self-pay | Admitting: *Deleted

## 2012-11-11 MED ORDER — POTASSIUM CHLORIDE ER 10 MEQ PO TBCR: 10.0000 meq | EXTENDED_RELEASE_TABLET | Freq: Two times a day (BID) | ORAL | Status: DC

## 2012-11-11 NOTE — Telephone Encounter (Signed)
Called to verify if patient is still taking Potassium Chloride QID. Patient was not available a the moment but message was left with husband to call [facility] pertaining to a medication refill.   Micki Riley CMA

## 2012-11-15 ENCOUNTER — Ambulatory Visit: Payer: Self-pay | Admitting: Cardiology

## 2012-11-17 ENCOUNTER — Ambulatory Visit: Payer: Medicare Other | Admitting: Internal Medicine

## 2012-11-24 DIAGNOSIS — R197 Diarrhea, unspecified: Secondary | ICD-10-CM | POA: Diagnosis not present

## 2012-11-24 DIAGNOSIS — I2510 Atherosclerotic heart disease of native coronary artery without angina pectoris: Secondary | ICD-10-CM | POA: Diagnosis not present

## 2012-11-24 DIAGNOSIS — Z9109 Other allergy status, other than to drugs and biological substances: Secondary | ICD-10-CM | POA: Diagnosis not present

## 2012-11-24 DIAGNOSIS — I10 Essential (primary) hypertension: Secondary | ICD-10-CM | POA: Diagnosis not present

## 2012-11-24 DIAGNOSIS — M4120 Other idiopathic scoliosis, site unspecified: Secondary | ICD-10-CM | POA: Diagnosis not present

## 2012-11-24 DIAGNOSIS — E785 Hyperlipidemia, unspecified: Secondary | ICD-10-CM | POA: Diagnosis not present

## 2012-11-24 DIAGNOSIS — Z1331 Encounter for screening for depression: Secondary | ICD-10-CM | POA: Diagnosis not present

## 2012-11-24 DIAGNOSIS — S99929A Unspecified injury of unspecified foot, initial encounter: Secondary | ICD-10-CM | POA: Diagnosis not present

## 2012-11-25 ENCOUNTER — Encounter: Payer: Self-pay | Admitting: Cardiology

## 2012-11-25 ENCOUNTER — Ambulatory Visit: Payer: BLUE CROSS/BLUE SHIELD

## 2012-11-25 ENCOUNTER — Ambulatory Visit (INDEPENDENT_AMBULATORY_CARE_PROVIDER_SITE_OTHER): Payer: Medicare Other | Admitting: Cardiology

## 2012-11-25 VITALS — BP 142/88 | HR 56 | Wt 174.0 lb

## 2012-11-25 DIAGNOSIS — E7800 Pure hypercholesterolemia, unspecified: Secondary | ICD-10-CM

## 2012-11-25 DIAGNOSIS — K589 Irritable bowel syndrome without diarrhea: Secondary | ICD-10-CM

## 2012-11-25 DIAGNOSIS — I259 Chronic ischemic heart disease, unspecified: Secondary | ICD-10-CM

## 2012-11-25 DIAGNOSIS — I119 Hypertensive heart disease without heart failure: Secondary | ICD-10-CM | POA: Diagnosis not present

## 2012-11-25 NOTE — Assessment & Plan Note (Signed)
Her blood pressure is still quite labile.  When she wakes up in the middle of the night she will take a blood pressure pill and empirically.  Normally she takes a Norvasc but if her heart is racing and pounding she might take a beta blocker instead.

## 2012-11-25 NOTE — Assessment & Plan Note (Signed)
The patient has known ischemic heart disease.  She has not been experiencing any recent chest tightness to suggest angina pectoris.  Her last electrocardiogram year ago on 11/03/11 showed normal sinus rhythm with PVCs and no ischemic changes.

## 2012-11-25 NOTE — Assessment & Plan Note (Addendum)
The patient has a history of hypercholesterolemia.  We reviewed her labs from February and they are satisfactory and showing improvement.  She is tolerating low dose of Lipitor 10 mg daily without myalgias or side effects

## 2012-11-25 NOTE — Patient Instructions (Addendum)
Your physician recommends that you continue on your current medications as directed. Please refer to the Current Medication list given to you today.  Your physician wants you to follow-up in: 4 months with fasting labs (lp/bmet/hfp) and ekg You will receive a reminder letter in the mail two months in advance. If you don't receive a letter, please call our office to schedule the follow-up appointment.  

## 2012-11-25 NOTE — Assessment & Plan Note (Signed)
The patient has debilitating problems with alternating constipation and diarrhea.  She has seen gastroenterologist here in [place] and more recently she has been followed at [facility].  She is still frustrated that no good solution for her symptoms has been found.

## 2012-11-25 NOTE — Progress Notes (Signed)
Vanessa Nielsen Date of Birth:  02-17-1934 [facility] [address] [place], [place]  [address] [phone]         Fax   [phone]  History of Present Illness:  This pleasant 79 year old woman is seen back for a four-month followup office visit. She has a past history of ischemic heart disease. She has a long history of hypertensive cardiovascular disease and hypercholesterolemia. She has had previous bypass graft surgery in [place].  Her last nuclear stress test was in 2012 which was preoperative prior to orthopedic surgery. She has multiple orthopedic issues. She has spina bifida and scoliosis. She also has had a lot of GI issues with severe constipation alternating with explosive diarrhea and fecal incontinence. She ambulates with difficulty and is in a wheelchair most of the time. When she does walk she uses a walker. She's had a lot of problems with nasal stuffiness and difficulty breathing at night. Since last visit she has continued to be involved with multiple systems disease. She feels that at the present time her gastrointestinal problems are her main problem. She has been seeing a gastroenterologist at [facility]. Her nausea is better controlled with Phenergan than the previously used Zofran.   Current Outpatient Prescriptions  Medication Sig Dispense Refill  . AMITIZA 8 MCG capsule 1 tab daily occ      . amLODipine (NORVASC) 5 MG tablet Take 1 tablet (5 mg total) by mouth 2 (two) times daily.  180 tablet  1  . aspirin 81 MG EC tablet Take 81 mg by mouth daily.        Marland Kitchen atorvastatin (LIPITOR) 10 MG tablet Take 1 tablet (10 mg total) by mouth daily.  90 tablet  1  . diazepam (VALIUM) 5 MG tablet Take 1 tablet (5 mg total) by mouth daily as needed.  90 tablet  1  . docusate sodium (COLACE) 100 MG capsule Take 100 mg by mouth 2 (two) times daily.       Marland Kitchen esomeprazole (NEXIUM) 40 MG capsule Take 40 mg by mouth daily before breakfast.      .  fluticasone (FLONASE) 50 MCG/ACT nasal spray Place 2 sprays into the nose daily.  16 g  11  . hydrocortisone (CORTEF) 10 MG tablet Take 10 mg by mouth as directed.      . lidocaine (LIDODERM) 5 % Place 1 patch onto the skin daily. Remove & Discard patch within 12 hours or as directed by MD      . Loperamide HCl (IMODIUM A-D PO) Take by mouth. As needed      . losartan-hydrochlorothiazide (HYZAAR) 100-25 MG per tablet Taking 1/2 daily      . meclizine (ANTIVERT) 25 MG tablet Take 1 tablet (25 mg total) by mouth 3 (three) times daily as needed.  90 tablet  1  . metoprolol (LOPRESSOR) 50 MG tablet Take 1 tablet (50 mg total) by mouth 3 (three) times daily.  270 tablet  3  . nitroGLYCERIN (NITROSTAT) 0.4 MG SL tablet Place 0.4 mg under the tongue every 5 (five) minutes as needed.        . ondansetron (ZOFRAN) 8 MG tablet Take by mouth every 8 (eight) hours as needed.      . polyethylene glycol (MIRALAX / GLYCOLAX) packet Take 17 g by mouth 2 (two) times daily.      . potassium chloride (K-DUR) 10 MEQ tablet Take 1 tablet (10 mEq total) by mouth 2 (two) times daily.  60 tablet  5  . Probiotic Product (ALIGN PO) Take by mouth daily.      . promethazine (PHENERGAN) 25 MG tablet Take 1 tablet (25 mg total) by mouth every 6 (six) hours as needed.  30 tablet  1  . pseudoephedrine (SUDAFED) 120 MG 12 hr tablet Take 60 mg by mouth every 12 (twelve) hours.        . simethicone (MYLICON) 80 MG chewable tablet Chew 80 mg by mouth every 6 (six) hours as needed. Gas x as needed       No current facility-administered medications for this visit.    Allergies  Allergen Reactions  . Cardura (Doxazosin Mesylate)   . Clarithromycin   . Erythromycin   . Guaifenesin   . Ketorolac Tromethamine     Patient Active Problem List  Diagnosis  . Ischemic heart disease  . Benign hypertensive heart disease without heart failure  . Hypercholesterolemia  . Spina bifida of lumbar spine  . Constipation  . Cushing  syndrome  . Allergic rhinitis    History  Smoking status  . Never Smoker   Smokeless tobacco  . Not on file    Comment: married, lives with spouse. 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 Cancer Father 51    colon  . Arthritis Father   . Arthritis Mother   . Heart disease Other   . Hypertension Other     Review of Systems: Constitutional: no fever chills diaphoresis or fatigue or change in weight.  Head and neck: no hearing loss, no epistaxis, no photophobia or visual disturbance. Respiratory: No cough, shortness of breath or wheezing. Cardiovascular: No chest pain peripheral edema, palpitations. Gastrointestinal: No abdominal distention, no abdominal pain, no change in bowel habits hematochezia or melena. Genitourinary: No dysuria, no frequency, no urgency, no nocturia. Musculoskeletal:No arthralgias, no back pain, no gait disturbance or myalgias. Neurological: No dizziness, no headaches, no numbness, no seizures, no syncope, no weakness, no tremors. Hematologic: No lymphadenopathy, no easy bruising. Psychiatric: No confusion, no hallucinations, no sleep disturbance.    Physical Exam: Filed Vitals:   11/25/12 1405  BP: 142/88  Pulse: 56   the general appearance reveals a well-developed well-nourished middle-age woman in no distress.  She arrives in a wheelchair.  Her husband is with her.The head and neck exam reveals pupils equal and reactive.  Extraocular movements are full.  There is no scleral icterus.  The mouth and pharynx are normal.  The neck is supple.  The carotids reveal no bruits.  The jugular venous pressure is normal.  The  thyroid is not enlarged.  There is no lymphadenopathy.  The chest is clear to percussion and auscultation.  There are no rales or rhonchi.  Expansion of the chest is symmetrical.  The precordium is quiet.  The first heart sound is  normal.  The second heart sound is physiologically split.  There is no murmur gallop rub or click.  There is no abnormal lift or heave.  The abdomen is soft and nontender.  The bowel sounds are normal.  The liver and spleen are not enlarged.  There are no abdominal masses.  There are no abdominal bruits.  Extremities reveal good pedal pulses.   There is no cyanosis or clubbing.  Strength is decreased in the lower extremities There is no lateralizing weakness.  There are no sensory deficits.  The skin is warm and dry.  There is no rash.     Assessment / Plan: The patient is to continue current medication.  She needs a primary care and has found a physician Dr. Link Snuffer will be her new primary care physician. She will return here in 4 months for followup office visit EKG lipid panel hepatic function panel and basal metabolic panel

## 2012-11-30 ENCOUNTER — Telehealth: Payer: Self-pay | Admitting: Cardiology

## 2012-11-30 MED ORDER — POTASSIUM CHLORIDE ER 10 MEQ PO CPCR: 10.0000 meq | ORAL_CAPSULE | Freq: Two times a day (BID) | ORAL | Status: DC

## 2012-11-30 NOTE — Telephone Encounter (Signed)
PT CALLING RE GETTING A DIFFERENT PILL FOR POTASSIUM , KLOR-KON THAN SHE GOT LAST TIME, HAVING TROUBLE TAKING IT, PLS CALL UNTIL 130P OR AFTER 330P

## 2012-11-30 NOTE — Telephone Encounter (Signed)
Changed patient to capsule, called to [facility]

## 2012-12-16 ENCOUNTER — Ambulatory Visit: Payer: BLUE CROSS/BLUE SHIELD

## 2012-12-23 DIAGNOSIS — M19019 Primary osteoarthritis, unspecified shoulder: Secondary | ICD-10-CM | POA: Diagnosis not present

## 2012-12-27 DIAGNOSIS — M47817 Spondylosis without myelopathy or radiculopathy, lumbosacral region: Secondary | ICD-10-CM | POA: Diagnosis not present

## 2012-12-27 DIAGNOSIS — M1710 Unilateral primary osteoarthritis, unspecified knee: Secondary | ICD-10-CM | POA: Diagnosis not present

## 2012-12-27 DIAGNOSIS — M159 Polyosteoarthritis, unspecified: Secondary | ICD-10-CM | POA: Diagnosis not present

## 2012-12-28 DIAGNOSIS — N390 Urinary tract infection, site not specified: Secondary | ICD-10-CM | POA: Diagnosis not present

## 2012-12-29 ENCOUNTER — Ambulatory Visit: Payer: BLUE CROSS/BLUE SHIELD | Admitting: Internal Medicine

## 2013-01-12 DIAGNOSIS — K5900 Constipation, unspecified: Secondary | ICD-10-CM | POA: Diagnosis not present

## 2013-01-19 DIAGNOSIS — E785 Hyperlipidemia, unspecified: Secondary | ICD-10-CM | POA: Diagnosis not present

## 2013-01-19 DIAGNOSIS — I10 Essential (primary) hypertension: Secondary | ICD-10-CM | POA: Diagnosis not present

## 2013-01-19 DIAGNOSIS — I2510 Atherosclerotic heart disease of native coronary artery without angina pectoris: Secondary | ICD-10-CM | POA: Diagnosis not present

## 2013-01-20 DIAGNOSIS — I2510 Atherosclerotic heart disease of native coronary artery without angina pectoris: Secondary | ICD-10-CM | POA: Diagnosis not present

## 2013-01-26 DIAGNOSIS — I10 Essential (primary) hypertension: Secondary | ICD-10-CM | POA: Diagnosis not present

## 2013-01-26 DIAGNOSIS — Z1212 Encounter for screening for malignant neoplasm of rectum: Secondary | ICD-10-CM | POA: Diagnosis not present

## 2013-01-26 DIAGNOSIS — R197 Diarrhea, unspecified: Secondary | ICD-10-CM | POA: Diagnosis not present

## 2013-01-26 DIAGNOSIS — Z0000 Encounter for general adult medical examination without abnormal findings: Secondary | ICD-10-CM | POA: Diagnosis not present

## 2013-01-26 DIAGNOSIS — I2510 Atherosclerotic heart disease of native coronary artery without angina pectoris: Secondary | ICD-10-CM | POA: Diagnosis not present

## 2013-01-26 DIAGNOSIS — N390 Urinary tract infection, site not specified: Secondary | ICD-10-CM | POA: Diagnosis not present

## 2013-01-26 DIAGNOSIS — E785 Hyperlipidemia, unspecified: Secondary | ICD-10-CM | POA: Diagnosis not present

## 2013-01-26 DIAGNOSIS — M4120 Other idiopathic scoliosis, site unspecified: Secondary | ICD-10-CM | POA: Diagnosis not present

## 2013-01-26 DIAGNOSIS — Q062 Diastematomyelia: Secondary | ICD-10-CM | POA: Diagnosis not present

## 2013-02-09 DIAGNOSIS — M899 Disorder of bone, unspecified: Secondary | ICD-10-CM | POA: Diagnosis not present

## 2013-02-24 ENCOUNTER — Other Ambulatory Visit: Payer: Self-pay | Admitting: *Deleted

## 2013-02-24 MED ORDER — ATORVASTATIN CALCIUM 10 MG PO TABS: 10.0000 mg | ORAL_TABLET | Freq: Every day | ORAL | Status: DC

## 2013-02-25 ENCOUNTER — Telehealth: Payer: Self-pay | Admitting: Cardiology

## 2013-02-25 NOTE — Telephone Encounter (Signed)
Spoke with patient and she has picked up Rx

## 2013-02-25 NOTE — Telephone Encounter (Signed)
Follow Up    Pt calling following up on refill request for LIPITOR. Please call.

## 2013-02-28 DIAGNOSIS — I10 Essential (primary) hypertension: Secondary | ICD-10-CM | POA: Diagnosis not present

## 2013-03-07 DIAGNOSIS — B351 Tinea unguium: Secondary | ICD-10-CM | POA: Diagnosis not present

## 2013-03-07 DIAGNOSIS — L821 Other seborrheic keratosis: Secondary | ICD-10-CM | POA: Diagnosis not present

## 2013-03-07 DIAGNOSIS — Z85828 Personal history of other malignant neoplasm of skin: Secondary | ICD-10-CM | POA: Diagnosis not present

## 2013-03-07 DIAGNOSIS — L820 Inflamed seborrheic keratosis: Secondary | ICD-10-CM | POA: Diagnosis not present

## 2013-03-30 ENCOUNTER — Other Ambulatory Visit (INDEPENDENT_AMBULATORY_CARE_PROVIDER_SITE_OTHER): Payer: Medicare Other

## 2013-03-30 DIAGNOSIS — I119 Hypertensive heart disease without heart failure: Secondary | ICD-10-CM

## 2013-03-30 LAB — LIPID PANEL
LDL Cholesterol: 118 mg/dL — ABNORMAL HIGH (ref 0–99)
VLDL: 24 mg/dL (ref 0.0–40.0)

## 2013-03-30 LAB — HEPATIC FUNCTION PANEL
Bilirubin, Direct: 0.1 mg/dL (ref 0.0–0.3)
Total Bilirubin: 0.7 mg/dL (ref 0.3–1.2)

## 2013-03-30 LAB — BASIC METABOLIC PANEL
BUN: 10 mg/dL (ref 6–23)
Chloride: 99 mEq/L (ref 96–112)
Glucose, Bld: 91 mg/dL (ref 70–99)
Potassium: 3.4 mEq/L — ABNORMAL LOW (ref 3.5–5.1)

## 2013-03-31 ENCOUNTER — Telehealth: Payer: Self-pay | Admitting: *Deleted

## 2013-03-31 MED ORDER — POTASSIUM CHLORIDE ER 10 MEQ PO CPCR: 10.0000 meq | ORAL_CAPSULE | Freq: Three times a day (TID) | ORAL | Status: DC

## 2013-03-31 NOTE — Telephone Encounter (Signed)
Advised of medication change and printed copy for patient

## 2013-03-31 NOTE — Telephone Encounter (Signed)
Message copied by Burnell Blanks on Thu Mar 31, 2013 11:24 AM ------      Message from: Cassell Clement      Created: Thu Mar 31, 2013  8:59 AM       Lab satisfactory.  Potassium is low. Increase K tabs to TID. Copy for OV ------

## 2013-04-01 ENCOUNTER — Other Ambulatory Visit: Payer: Self-pay | Admitting: Cardiology

## 2013-04-01 DIAGNOSIS — F419 Anxiety disorder, unspecified: Secondary | ICD-10-CM

## 2013-04-04 DIAGNOSIS — M129 Arthropathy, unspecified: Secondary | ICD-10-CM | POA: Diagnosis not present

## 2013-04-04 DIAGNOSIS — M1990 Unspecified osteoarthritis, unspecified site: Secondary | ICD-10-CM | POA: Diagnosis not present

## 2013-04-04 DIAGNOSIS — M47817 Spondylosis without myelopathy or radiculopathy, lumbosacral region: Secondary | ICD-10-CM | POA: Diagnosis not present

## 2013-04-04 DIAGNOSIS — Q062 Diastematomyelia: Secondary | ICD-10-CM | POA: Diagnosis not present

## 2013-04-06 ENCOUNTER — Encounter: Payer: Self-pay | Admitting: Cardiology

## 2013-04-06 ENCOUNTER — Ambulatory Visit (INDEPENDENT_AMBULATORY_CARE_PROVIDER_SITE_OTHER): Payer: Medicare Other | Admitting: Cardiology

## 2013-04-06 ENCOUNTER — Ambulatory Visit: Payer: BLUE CROSS/BLUE SHIELD | Admitting: Cardiology

## 2013-04-06 VITALS — BP 122/78 | HR 69 | Ht 59.0 in | Wt 177.0 lb

## 2013-04-06 DIAGNOSIS — I259 Chronic ischemic heart disease, unspecified: Secondary | ICD-10-CM | POA: Diagnosis not present

## 2013-04-06 DIAGNOSIS — E7800 Pure hypercholesterolemia, unspecified: Secondary | ICD-10-CM

## 2013-04-06 DIAGNOSIS — I119 Hypertensive heart disease without heart failure: Secondary | ICD-10-CM

## 2013-04-06 MED ORDER — ATORVASTATIN CALCIUM 20 MG PO TABS: 20.0000 mg | ORAL_TABLET | Freq: Every day | ORAL | Status: DC

## 2013-04-06 NOTE — Assessment & Plan Note (Signed)
The patient's LDL is increasing and we will increase her Lipitor to 20 mg daily.  She is not having any myalgias from the statin therapy

## 2013-04-06 NOTE — Patient Instructions (Addendum)
DECREASE YOUR POTASSIUM TO TWICE A DAY   INCREASE YOUR LIPITOR TO 20 MG DAILY  Your physician wants you to follow-up in: 4 months with fasting labs (lp/bmet/hfp)  You will receive a reminder letter in the mail two months in advance. If you don't receive a letter, please call our office to schedule the follow-up appointment.

## 2013-04-06 NOTE — Progress Notes (Signed)
Vanessa Nielsen Date of Birth:  03-Mar-1934 [facility] [address] [place], [place]  [address] [phone]         Fax   [phone]  History of Present Illness: This pleasant 79 year old woman is seen back for a four-month followup office visit. She has a past history of ischemic heart disease. She has a long history of hypertensive cardiovascular disease and hypercholesterolemia. She has had previous bypass graft surgery in [place]. Her last nuclear stress test was in 2012 which was preoperative prior to orthopedic surgery. She has multiple orthopedic issues. She has spina bifida and scoliosis. She also has had a lot of GI issues with severe constipation alternating with explosive diarrhea and fecal incontinence. She ambulates with difficulty and is in a wheelchair most of the time. When she does walk she uses a walker. She's had a lot of problems with nasal stuffiness and difficulty breathing at night. Since last visit she has continued to be involved with multiple systems disease. She feels that at the present time her gastrointestinal problems are her main problem. She has been seeing a gastroenterologist at [facility].  Since last visit she has seen her new PCP and had a bone density test which showed osteopenia.   Current Outpatient Prescriptions  Medication Sig Dispense Refill  . AMITIZA 8 MCG capsule 1 tab daily occ      . amLODipine (NORVASC) 5 MG tablet TAKE 1 TABLET (5 MG TOTAL) BY MOUTH 2 (TWO) TIMES DAILY.  180 tablet  1  . aspirin 81 MG EC tablet Take 81 mg by mouth daily.        Marland Kitchen atorvastatin (LIPITOR) 20 MG tablet Take 1 tablet (20 mg total) by mouth daily.  90 tablet  3  . docusate sodium (COLACE) 100 MG capsule Take 100 mg by mouth 2 (two) times daily.       Marland Kitchen esomeprazole (NEXIUM) 40 MG capsule Take 40 mg by mouth daily before breakfast.      . fluticasone (FLONASE) 50 MCG/ACT nasal spray Place 2 sprays into the nose daily.  16 g  11   . hydrocortisone (CORTEF) 10 MG tablet Take 10 mg by mouth as directed.      . lidocaine (LIDODERM) 5 % Place 1 patch onto the skin daily. Remove & Discard patch within 12 hours or as directed by MD      . Loperamide HCl (IMODIUM A-D PO) Take by mouth. As needed      . losartan-hydrochlorothiazide (HYZAAR) 100-25 MG per tablet Taking 1/2 daily      . meclizine (ANTIVERT) 25 MG tablet Take 1 tablet (25 mg total) by mouth 3 (three) times daily as needed.  90 tablet  1  . metoprolol (LOPRESSOR) 50 MG tablet Take 1 tablet (50 mg total) by mouth 3 (three) times daily.  270 tablet  3  . nitroGLYCERIN (NITROSTAT) 0.4 MG SL tablet Place 0.4 mg under the tongue every 5 (five) minutes as needed.        . ondansetron (ZOFRAN) 8 MG tablet Take by mouth every 8 (eight) hours as needed.      . polyethylene glycol (MIRALAX / GLYCOLAX) packet Take 17 g by mouth 2 (two) times daily.      . potassium chloride (MICRO-K) 10 MEQ CR capsule Take 10 mEq by mouth 2 (two) times daily.      . Probiotic Product (ALIGN PO) Take by mouth daily.      . promethazine (  PHENERGAN) 25 MG tablet Take 1 tablet (25 mg total) by mouth every 6 (six) hours as needed.  30 tablet  1  . pseudoephedrine (SUDAFED) 120 MG 12 hr tablet Take 60 mg by mouth every 12 (twelve) hours.        . simethicone (MYLICON) 80 MG chewable tablet Chew 80 mg by mouth every 6 (six) hours as needed. Gas x as needed      . terbinafine (LAMISIL) 250 MG tablet Take 250 mg by mouth daily.      Marland Kitchen VALIUM 5 MG tablet TAKE 1 TABLET BY MOUTH EVERY DAY AS NEEDED  90 tablet  1   No current facility-administered medications for this visit.    Allergies  Allergen Reactions  . Cardura (Doxazosin Mesylate)   . Clarithromycin   . Erythromycin   . Guaifenesin   . Ketorolac Tromethamine     Patient Active Problem List   Diagnosis Date Noted  . Cushing syndrome   . Allergic rhinitis   . Ischemic heart disease 02/25/2011  . Benign hypertensive heart disease without  heart failure 02/25/2011  . Hypercholesterolemia 02/25/2011  . Spina bifida of lumbar spine 02/25/2011  . Irritable bowel syndrome (IBS) 02/25/2011    History  Smoking status  . Never Smoker   Smokeless tobacco  . Not on file    Comment: married, lives with spouse. 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 Cancer Father 83    colon  . Arthritis Father   . Arthritis Mother   . Heart disease Other   . Hypertension Other     Review of Systems: Constitutional: no fever chills diaphoresis or fatigue or change in weight.  Head and neck: no hearing loss, no epistaxis, no photophobia or visual disturbance. Respiratory: No cough, shortness of breath or wheezing. Cardiovascular: No chest pain peripheral edema, palpitations. Gastrointestinal: No abdominal distention, no abdominal pain, no change in bowel habits hematochezia or melena. Genitourinary: No dysuria, no frequency, no urgency, no nocturia. Musculoskeletal:No arthralgias, no back pain, no gait disturbance or myalgias. Neurological: No dizziness, no headaches, no numbness, no seizures, no syncope, no weakness, no tremors. Hematologic: No lymphadenopathy, no easy bruising. Psychiatric: No confusion, no hallucinations, no sleep disturbance.    Physical Exam: Filed Vitals:   04/06/13 1421  BP: 122/78  Pulse: 69   The general appearance reveals a well-developed well-nourished woman in no distress.  She is in a wheelchair.  She has severe scoliosis.The head and neck exam reveals pupils equal and reactive.  Extraocular movements are full.  There is no scleral icterus.  The mouth and pharynx are normal.  The neck is supple.  The carotids reveal no bruits.  The jugular venous pressure is normal.  The  thyroid is not enlarged.  There is no lymphadenopathy.  The chest is clear to percussion and auscultation.  There are no rales or  rhonchi.  Expansion of the chest is symmetrical.  The precordium is quiet.  The first heart sound is normal.  The second heart sound is physiologically split.  There is no murmur gallop rub or click.  There is no abnormal lift or heave.  The abdomen is soft and nontender.  The bowel sounds are normal.  The liver and spleen are not enlarged.  There are no abdominal masses.  There are no abdominal bruits.  Extremities reveal good pedal pulses.  There is no phlebitis or edema.  There is no cyanosis or clubbing.  Strength is normal and symmetrical in all extremities.  There is no lateralizing weakness.  There are no sensory deficits.  The skin is warm and dry.  There is no rash.  EKG shows normal sinus rhythm and right axis deviation and since 11/03/11, PVCs are now absent.  Assessment / Plan: Continue on same medication.  Increase Lipitor. Recheck in 4 months for followup office visit lipid panel hepatic function panel and basal metabolic panel.

## 2013-04-06 NOTE — Assessment & Plan Note (Signed)
Pressure was remaining stable on current therapy

## 2013-04-06 NOTE — Assessment & Plan Note (Signed)
The patient has not been experiencing any chest pain or angina. 

## 2013-05-18 ENCOUNTER — Other Ambulatory Visit: Payer: Self-pay | Admitting: Cardiology

## 2013-05-18 DIAGNOSIS — R42 Dizziness and giddiness: Secondary | ICD-10-CM

## 2013-05-20 DIAGNOSIS — K5900 Constipation, unspecified: Secondary | ICD-10-CM | POA: Diagnosis not present

## 2013-05-20 DIAGNOSIS — Z23 Encounter for immunization: Secondary | ICD-10-CM | POA: Diagnosis not present

## 2013-05-20 DIAGNOSIS — Z6835 Body mass index (BMI) 35.0-35.9, adult: Secondary | ICD-10-CM | POA: Diagnosis not present

## 2013-05-20 DIAGNOSIS — R110 Nausea: Secondary | ICD-10-CM | POA: Diagnosis not present

## 2013-05-20 DIAGNOSIS — R197 Diarrhea, unspecified: Secondary | ICD-10-CM | POA: Diagnosis not present

## 2013-05-31 ENCOUNTER — Telehealth: Payer: Self-pay | Admitting: Gastroenterology

## 2013-05-31 NOTE — Telephone Encounter (Signed)
Vanessa Nielsen notified that per Dr. Russella Dar patient needs to return to Dr. Alycia Rossetti at [facility], he declines to accept her as a patient.

## 2013-05-31 NOTE — Telephone Encounter (Signed)
Patient has recently seen Dr. Alycia Rossetti at [facility] and has a history with Dr. Ewing Schlein .  Dr. Russella Dar records are on your desk please review

## 2013-06-15 DIAGNOSIS — N390 Urinary tract infection, site not specified: Secondary | ICD-10-CM | POA: Diagnosis not present

## 2013-07-20 ENCOUNTER — Other Ambulatory Visit: Payer: Self-pay | Admitting: Internal Medicine

## 2013-07-20 DIAGNOSIS — I10 Essential (primary) hypertension: Secondary | ICD-10-CM | POA: Diagnosis not present

## 2013-07-20 DIAGNOSIS — Z6838 Body mass index (BMI) 38.0-38.9, adult: Secondary | ICD-10-CM | POA: Diagnosis not present

## 2013-07-20 DIAGNOSIS — K5900 Constipation, unspecified: Secondary | ICD-10-CM | POA: Diagnosis not present

## 2013-07-20 DIAGNOSIS — E785 Hyperlipidemia, unspecified: Secondary | ICD-10-CM | POA: Diagnosis not present

## 2013-07-20 DIAGNOSIS — R269 Unspecified abnormalities of gait and mobility: Secondary | ICD-10-CM | POA: Diagnosis not present

## 2013-07-20 DIAGNOSIS — R599 Enlarged lymph nodes, unspecified: Secondary | ICD-10-CM | POA: Diagnosis not present

## 2013-07-20 DIAGNOSIS — R42 Dizziness and giddiness: Secondary | ICD-10-CM | POA: Diagnosis not present

## 2013-07-27 ENCOUNTER — Other Ambulatory Visit: Payer: BLUE CROSS/BLUE SHIELD

## 2013-07-28 ENCOUNTER — Ambulatory Visit
Admission: RE | Admit: 2013-07-28 | Discharge: 2013-07-28 | Disposition: A | Discharge status: Home / Self Care | Payer: Medicare Other | Source: Ambulatory Visit | Attending: Internal Medicine | Admitting: Internal Medicine

## 2013-07-28 ENCOUNTER — Other Ambulatory Visit: Payer: BLUE CROSS/BLUE SHIELD

## 2013-07-28 DIAGNOSIS — K118 Other diseases of salivary glands: Secondary | ICD-10-CM | POA: Diagnosis not present

## 2013-07-28 DIAGNOSIS — R599 Enlarged lymph nodes, unspecified: Secondary | ICD-10-CM

## 2013-07-28 MED ORDER — IOHEXOL 300 MG/ML  SOLN
75.0000 mL | Freq: Once | INTRAMUSCULAR | Status: AC | PRN
  Administered 2013-07-28: 75 mL via INTRAVENOUS

## 2013-08-08 ENCOUNTER — Ambulatory Visit: Payer: BLUE CROSS/BLUE SHIELD | Admitting: Cardiology

## 2013-08-09 ENCOUNTER — Other Ambulatory Visit: Payer: Self-pay | Admitting: Cardiology

## 2013-08-22 ENCOUNTER — Ambulatory Visit: Payer: BLUE CROSS/BLUE SHIELD | Admitting: Cardiology

## 2013-08-31 DIAGNOSIS — N390 Urinary tract infection, site not specified: Secondary | ICD-10-CM | POA: Diagnosis not present

## 2013-09-21 ENCOUNTER — Ambulatory Visit: Payer: BLUE CROSS/BLUE SHIELD | Admitting: Cardiology

## 2013-09-28 ENCOUNTER — Other Ambulatory Visit: Payer: Self-pay | Admitting: Cardiology

## 2013-09-28 DIAGNOSIS — K5900 Constipation, unspecified: Secondary | ICD-10-CM | POA: Diagnosis not present

## 2013-09-28 DIAGNOSIS — R143 Flatulence: Secondary | ICD-10-CM | POA: Diagnosis not present

## 2013-09-28 DIAGNOSIS — R141 Gas pain: Secondary | ICD-10-CM | POA: Diagnosis not present

## 2013-10-12 DIAGNOSIS — Q062 Diastematomyelia: Secondary | ICD-10-CM | POA: Diagnosis not present

## 2013-10-12 DIAGNOSIS — M159 Polyosteoarthritis, unspecified: Secondary | ICD-10-CM | POA: Diagnosis not present

## 2013-10-12 DIAGNOSIS — M47817 Spondylosis without myelopathy or radiculopathy, lumbosacral region: Secondary | ICD-10-CM | POA: Diagnosis not present

## 2013-10-19 ENCOUNTER — Ambulatory Visit (INDEPENDENT_AMBULATORY_CARE_PROVIDER_SITE_OTHER): Payer: Medicare Other | Admitting: Cardiology

## 2013-10-19 ENCOUNTER — Encounter: Payer: Self-pay | Admitting: Cardiology

## 2013-10-19 VITALS — BP 140/67 | HR 64 | Ht 59.75 in | Wt 183.0 lb

## 2013-10-19 DIAGNOSIS — K589 Irritable bowel syndrome without diarrhea: Secondary | ICD-10-CM

## 2013-10-19 DIAGNOSIS — I119 Hypertensive heart disease without heart failure: Secondary | ICD-10-CM

## 2013-10-19 DIAGNOSIS — I259 Chronic ischemic heart disease, unspecified: Secondary | ICD-10-CM

## 2013-10-19 NOTE — Assessment & Plan Note (Signed)
The patient has a history of ischemic heart disease.  She has not been having any recurrent chest pain or angina.  She has not had to take any sublingual nitroglycerin.

## 2013-10-19 NOTE — Assessment & Plan Note (Signed)
The patient has a history of labile hypertension.  She keeps meticulous records of her blood pressure at home.  If her blood pressure is high and her pulse is low she takes an extra Norvasc, and if her blood pressure is high and her pulse is fast she takes an extra beta blocker.

## 2013-10-19 NOTE — Progress Notes (Signed)
Vanessa Nielsen Date of Birth:  03/23/1934 [address] [place], [place]  [address] [phone]         Fax   [phone]  History of Present Illness: This pleasant 80 year old woman is seen back for a four-month followup office visit. She has a past history of ischemic heart disease. She has a long history of hypertensive cardiovascular disease and hypercholesterolemia. She has had previous bypass graft surgery in [place]. Her last nuclear stress test was in 2012 which was preoperative prior to orthopedic surgery. She has multiple orthopedic issues. She has spina bifida and scoliosis. She also has had a lot of GI issues with severe constipation alternating with explosive diarrhea and fecal incontinence. She ambulates with difficulty and is in a wheelchair most of the time. When she does walk she uses a walker. She's had a lot of problems with nasal stuffiness and difficulty breathing at night. Since last visit she has continued to be involved with multiple systems disease. She feels that at the present time her gastrointestinal problems are her main problem. She has been seeing a gastroenterologist at [facility].  Since last visit she has seen her new PCP and had a bone density test which showed osteopenia.   Current Outpatient Prescriptions  Medication Sig Dispense Refill  . AMITIZA 8 MCG capsule 1 tab daily occ      . amLODipine (NORVASC) 5 MG tablet TAKE 1 TABLET (5 MG TOTAL) BY MOUTH 2 (TWO) TIMES DAILY.  180 tablet  1  . aspirin 81 MG EC tablet Take 81 mg by mouth daily.        Marland Kitchen atorvastatin (LIPITOR) 20 MG tablet Take 1 tablet (20 mg total) by mouth daily.  90 tablet  3  . docusate sodium (COLACE) 100 MG capsule Take 100 mg by mouth 2 (two) times daily.       Marland Kitchen esomeprazole (NEXIUM) 40 MG capsule Take 40 mg by mouth daily before breakfast.      . fluticasone (FLONASE) 50 MCG/ACT nasal spray PLACE 2 SPRAYS INTO THE NOSE DAILY.  16 g  6  .  hydrocortisone (CORTEF) 10 MG tablet Take 10 mg by mouth as directed.      . lidocaine (LIDODERM) 5 % Place 1 patch onto the skin daily. Remove & Discard patch within 12 hours or as directed by MD      . Rolan Lipa 145 MCG CAPS capsule       . Loperamide HCl (IMODIUM A-D PO) Take by mouth. As needed      . losartan-hydrochlorothiazide (HYZAAR) 100-25 MG per tablet Taking 1/2 daily      . meclizine (ANTIVERT) 25 MG tablet TAKE 1 TABLET THREE TIMES A DAY AS NEEDED  90 tablet  2  . metoprolol (LOPRESSOR) 50 MG tablet TAKE 1 TABLET (50 MG TOTAL) BY MOUTH 3 (THREE) TIMES DAILY.  270 tablet  0  . nitroGLYCERIN (NITROSTAT) 0.4 MG SL tablet Place 0.4 mg under the tongue every 5 (five) minutes as needed.        . ondansetron (ZOFRAN) 8 MG tablet Take by mouth every 8 (eight) hours as needed.      . polyethylene glycol (MIRALAX / GLYCOLAX) packet Take 17 g by mouth 2 (two) times daily.      . potassium chloride (MICRO-K) 10 MEQ CR capsule Take 10 mEq by mouth 2 (two) times daily.      . Probiotic Product (ALIGN PO) Take by mouth daily.      Marland Kitchen  promethazine (PHENERGAN) 25 MG tablet Take 1 tablet (25 mg total) by mouth every 6 (six) hours as needed.  30 tablet  1  . pseudoephedrine (SUDAFED) 120 MG 12 hr tablet Take 60 mg by mouth every 12 (twelve) hours.        . simethicone (MYLICON) 80 MG chewable tablet Chew 80 mg by mouth every 6 (six) hours as needed. Gas x as needed      . terbinafine (LAMISIL) 250 MG tablet Take 250 mg by mouth daily.      Marland Kitchen VALIUM 5 MG tablet TAKE 1 TABLET BY MOUTH EVERY DAY AS NEEDED  90 tablet  1   No current facility-administered medications for this visit.    Allergies  Allergen Reactions  . Cardura [Doxazosin Mesylate]   . Clarithromycin   . Erythromycin   . Guaifenesin   . Ketorolac Tromethamine     Patient Active Problem List   Diagnosis Date Noted  . Cushing syndrome   . Allergic rhinitis   . Ischemic heart disease 02/25/2011  . Benign hypertensive heart disease  without heart failure 02/25/2011  . Hypercholesterolemia 02/25/2011  . Spina bifida of lumbar spine 02/25/2011  . Irritable bowel syndrome (IBS) 02/25/2011    History  Smoking status  . Never Smoker   Smokeless tobacco  . Not on file    Comment: married, lives with spouse. 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 Cancer Father 61    colon  . Arthritis Father   . Arthritis Mother   . Heart disease Other   . Hypertension Other     Review of Systems: Constitutional: no fever chills diaphoresis or fatigue or change in weight.  Head and neck: no hearing loss, no epistaxis, no photophobia or visual disturbance. Respiratory: No cough, shortness of breath or wheezing. Cardiovascular: No chest pain peripheral edema, palpitations. Gastrointestinal: No abdominal distention, no abdominal pain, no change in bowel habits hematochezia or melena. Genitourinary: No dysuria, no frequency, no urgency, no nocturia. Musculoskeletal:No arthralgias, no back pain, no gait disturbance or myalgias. Neurological: No dizziness, no headaches, no numbness, no seizures, no syncope, no weakness, no tremors. Hematologic: No lymphadenopathy, no easy bruising. Psychiatric: No confusion, no hallucinations, no sleep disturbance.    Physical Exam: Filed Vitals:   10/19/13 1603  BP: 140/67  Pulse: 64   The general appearance reveals a well-developed well-nourished woman in no distress.  She is in a wheelchair.  She has severe scoliosis.The head and neck exam reveals pupils equal and reactive.  Extraocular movements are full.  There is no scleral icterus.  The mouth and pharynx are normal.  The neck is supple.  The carotids reveal no bruits.  The jugular venous pressure is normal.  The  thyroid is not enlarged.  There is no lymphadenopathy.  The chest is clear to percussion and auscultation.  There are no  rales or rhonchi.  Expansion of the chest is symmetrical.  The precordium is quiet.  The first heart sound is normal.  The second heart sound is physiologically split.  There is no murmur gallop rub or click.  There is no abnormal lift or heave.  The abdomen is soft and nontender.  The bowel sounds are normal.  The liver and spleen are not enlarged.  There are no abdominal masses.  There are no abdominal bruits.  Extremities reveal good pedal  pulses.  There is no phlebitis or edema.  There is no cyanosis or clubbing.  Strength is normal and symmetrical in all extremities.  There is no lateralizing weakness.  There are no sensory deficits.  The skin is warm and dry.  There is no rash.    Assessment / Plan: Continue on same medication.  Continue same medication.  Recheck in 4 months for office visit EKG and fasting lipid panel hepatic function panel and basal metabolic panel.  She is very sedentary.  She is going to try to ambulate more with her walker.  I believe that if she could be more physically active it would help her severe IBS and constipation.

## 2013-10-19 NOTE — Assessment & Plan Note (Signed)
The patient has had a terrible time with her IBS.  She is seeing gastroenterologist at [facility].  She is still having a lot of alternating severe constipation and uncontrolled diarrhea.

## 2013-10-19 NOTE — Patient Instructions (Signed)
Your physician recommends that you continue on your current medications as directed. Please refer to the Current Medication list given to you today.  Your physician wants you to follow-up in: 4 months with fasting labs (lp/bmet/hfp)  And ekg You will receive a reminder letter in the mail two months in advance. If you don't receive a letter, please call our office to schedule the follow-up appointment.

## 2013-11-16 DIAGNOSIS — M19019 Primary osteoarthritis, unspecified shoulder: Secondary | ICD-10-CM | POA: Diagnosis not present

## 2013-12-21 ENCOUNTER — Emergency Department (HOSPITAL_COMMUNITY): Payer: Medicare Other

## 2013-12-21 ENCOUNTER — Inpatient Hospital Stay (HOSPITAL_COMMUNITY)
Admission: EM | Admit: 2013-12-21 | Discharge: 2013-12-22 | DRG: 392 | Disposition: A | Discharge status: Home / Self Care | Payer: Medicare Other | Attending: Internal Medicine | Admitting: Internal Medicine

## 2013-12-21 ENCOUNTER — Encounter (HOSPITAL_COMMUNITY): Payer: Self-pay | Admitting: Emergency Medicine

## 2013-12-21 DIAGNOSIS — E860 Dehydration: Secondary | ICD-10-CM | POA: Diagnosis present

## 2013-12-21 DIAGNOSIS — Z881 Allergy status to other antibiotic agents status: Secondary | ICD-10-CM

## 2013-12-21 DIAGNOSIS — Z96659 Presence of unspecified artificial knee joint: Secondary | ICD-10-CM

## 2013-12-21 DIAGNOSIS — Z79899 Other long term (current) drug therapy: Secondary | ICD-10-CM

## 2013-12-21 DIAGNOSIS — Z880 Allergy status to penicillin: Secondary | ICD-10-CM | POA: Diagnosis not present

## 2013-12-21 DIAGNOSIS — R1115 Cyclical vomiting syndrome unrelated to migraine: Secondary | ICD-10-CM | POA: Diagnosis present

## 2013-12-21 DIAGNOSIS — Z7982 Long term (current) use of aspirin: Secondary | ICD-10-CM

## 2013-12-21 DIAGNOSIS — J309 Allergic rhinitis, unspecified: Secondary | ICD-10-CM | POA: Diagnosis present

## 2013-12-21 DIAGNOSIS — R509 Fever, unspecified: Secondary | ICD-10-CM | POA: Diagnosis present

## 2013-12-21 DIAGNOSIS — R198 Other specified symptoms and signs involving the digestive system and abdomen: Secondary | ICD-10-CM | POA: Diagnosis not present

## 2013-12-21 DIAGNOSIS — Z951 Presence of aortocoronary bypass graft: Secondary | ICD-10-CM

## 2013-12-21 DIAGNOSIS — R6889 Other general symptoms and signs: Secondary | ICD-10-CM | POA: Diagnosis not present

## 2013-12-21 DIAGNOSIS — A088 Other specified intestinal infections: Principal | ICD-10-CM | POA: Diagnosis present

## 2013-12-21 DIAGNOSIS — E876 Hypokalemia: Secondary | ICD-10-CM | POA: Diagnosis present

## 2013-12-21 DIAGNOSIS — R197 Diarrhea, unspecified: Secondary | ICD-10-CM | POA: Diagnosis not present

## 2013-12-21 DIAGNOSIS — M1990 Unspecified osteoarthritis, unspecified site: Secondary | ICD-10-CM | POA: Diagnosis present

## 2013-12-21 DIAGNOSIS — Q059 Spina bifida, unspecified: Secondary | ICD-10-CM

## 2013-12-21 DIAGNOSIS — R112 Nausea with vomiting, unspecified: Secondary | ICD-10-CM | POA: Diagnosis not present

## 2013-12-21 DIAGNOSIS — E785 Hyperlipidemia, unspecified: Secondary | ICD-10-CM | POA: Diagnosis present

## 2013-12-21 DIAGNOSIS — Z87440 Personal history of urinary (tract) infections: Secondary | ICD-10-CM | POA: Diagnosis not present

## 2013-12-21 DIAGNOSIS — Z888 Allergy status to other drugs, medicaments and biological substances status: Secondary | ICD-10-CM

## 2013-12-21 DIAGNOSIS — R1110 Vomiting, unspecified: Secondary | ICD-10-CM

## 2013-12-21 DIAGNOSIS — E7800 Pure hypercholesterolemia, unspecified: Secondary | ICD-10-CM | POA: Diagnosis present

## 2013-12-21 DIAGNOSIS — I10 Essential (primary) hypertension: Secondary | ICD-10-CM | POA: Diagnosis not present

## 2013-12-21 DIAGNOSIS — I2510 Atherosclerotic heart disease of native coronary artery without angina pectoris: Secondary | ICD-10-CM | POA: Diagnosis present

## 2013-12-21 DIAGNOSIS — R1114 Bilious vomiting: Secondary | ICD-10-CM | POA: Diagnosis not present

## 2013-12-21 DIAGNOSIS — J984 Other disorders of lung: Secondary | ICD-10-CM | POA: Diagnosis not present

## 2013-12-21 LAB — COMPREHENSIVE METABOLIC PANEL
ALBUMIN: 3.1 g/dL — AB (ref 3.5–5.2)
ALT: 7 U/L (ref 0–35)
AST: 15 U/L (ref 0–37)
Alkaline Phosphatase: 113 U/L (ref 39–117)
BUN: 10 mg/dL (ref 6–23)
CO2: 30 meq/L (ref 19–32)
CREATININE: 0.65 mg/dL (ref 0.50–1.10)
Calcium: 8.4 mg/dL (ref 8.4–10.5)
Chloride: 90 mEq/L — ABNORMAL LOW (ref 96–112)
GFR calc Af Amer: >90 mL/min (ref >90)
GFR calc non Af Amer: 83 mL/min — ABNORMAL LOW (ref >90)
GLUCOSE: 111 mg/dL — AB (ref 70–99)
POTASSIUM: 2.8 meq/L — AB (ref 3.7–5.3)
Sodium: 133 mEq/L — ABNORMAL LOW (ref 137–147)
Total Bilirubin: 0.7 mg/dL (ref 0.3–1.2)
Total Protein: 6.6 g/dL (ref 6.0–8.3)

## 2013-12-21 LAB — URINALYSIS, ROUTINE W REFLEX MICROSCOPIC
Bilirubin Urine: NEGATIVE
GLUCOSE, UA: NEGATIVE mg/dL
HGB URINE DIPSTICK: NEGATIVE
Ketones, ur: NEGATIVE mg/dL
LEUKOCYTES UA: NEGATIVE
Nitrite: NEGATIVE
Protein, ur: NEGATIVE mg/dL
SPECIFIC GRAVITY, URINE: 1.006 (ref 1.005–1.030)
UROBILINOGEN UA: 0.2 mg/dL (ref 0.0–1.0)
pH: 7.5 (ref 5.0–8.0)

## 2013-12-21 LAB — CBC
HCT: 41.9 % (ref 36.0–46.0)
HEMOGLOBIN: 14.5 g/dL (ref 12.0–15.0)
MCH: 29.8 pg (ref 26.0–34.0)
MCHC: 34.6 g/dL (ref 30.0–36.0)
MCV: 86.2 fL (ref 78.0–100.0)
Platelets: 298 10*3/uL (ref 150–400)
RBC: 4.86 MIL/uL (ref 3.87–5.11)
RDW: 13 % (ref 11.5–15.5)
WBC: 17.3 10*3/uL — AB (ref 4.0–10.5)

## 2013-12-21 LAB — LIPASE, BLOOD: Lipase: 19 U/L (ref 11–59)

## 2013-12-21 MED ORDER — SODIUM CHLORIDE 0.9 % IJ SOLN: 3.0000 mL | Freq: Two times a day (BID) | INTRAMUSCULAR | Status: DC

## 2013-12-21 MED ORDER — SODIUM CHLORIDE 0.9 % IV BOLUS (SEPSIS)
1000.0000 mL | Freq: Once | INTRAVENOUS | Status: AC
  Administered 2013-12-21: 1000 mL via INTRAVENOUS

## 2013-12-21 MED ORDER — ASPIRIN 81 MG PO TBEC: 81.0000 mg | DELAYED_RELEASE_TABLET | Freq: Every day | ORAL | Status: DC

## 2013-12-21 MED ORDER — ONDANSETRON HCL 4 MG/2ML IJ SOLN
4.0000 mg | Freq: Once | INTRAMUSCULAR | Status: AC
Start: 2013-12-21 — End: 2013-12-21
  Administered 2013-12-21: 4 mg via INTRAVENOUS
  Filled 2013-12-21: qty 2

## 2013-12-21 MED ORDER — AMLODIPINE BESYLATE 5 MG PO TABS
5.0000 mg | ORAL_TABLET | Freq: Every day | ORAL | Status: DC
  Administered 2013-12-21 – 2013-12-22 (×2): 5 mg via ORAL
  Filled 2013-12-21 (×2): qty 1

## 2013-12-21 MED ORDER — LOSARTAN POTASSIUM 50 MG PO TABS
50.0000 mg | ORAL_TABLET | Freq: Every day | ORAL | Status: DC
  Administered 2013-12-21 – 2013-12-22 (×2): 50 mg via ORAL
  Filled 2013-12-21 (×2): qty 1

## 2013-12-21 MED ORDER — DIAZEPAM 5 MG PO TABS: 5.0000 mg | ORAL_TABLET | Freq: Every day | ORAL | Status: DC | PRN

## 2013-12-21 MED ORDER — POTASSIUM CHLORIDE IN NACL 20-0.9 MEQ/L-% IV SOLN
INTRAVENOUS | Status: DC
  Administered 2013-12-21 – 2013-12-22 (×2): via INTRAVENOUS
  Filled 2013-12-21 (×3): qty 1000

## 2013-12-21 MED ORDER — FLUTICASONE PROPIONATE 50 MCG/ACT NA SUSP
2.0000 | Freq: Every day | NASAL | Status: DC
  Filled 2013-12-21: qty 16

## 2013-12-21 MED ORDER — POTASSIUM CHLORIDE 10 MEQ/100ML IV SOLN
10.0000 meq | Freq: Once | INTRAVENOUS | Status: AC
  Administered 2013-12-21: 10 meq via INTRAVENOUS
  Filled 2013-12-21: qty 100

## 2013-12-21 MED ORDER — ASPIRIN 81 MG PO CHEW
81.0000 mg | CHEWABLE_TABLET | Freq: Every day | ORAL | Status: DC
  Administered 2013-12-22: 81 mg via ORAL
  Filled 2013-12-21 (×2): qty 1

## 2013-12-21 MED ORDER — METOPROLOL TARTRATE 50 MG PO TABS
50.0000 mg | ORAL_TABLET | Freq: Every day | ORAL | Status: DC
  Administered 2013-12-21 – 2013-12-22 (×2): 50 mg via ORAL
  Filled 2013-12-21 (×2): qty 1

## 2013-12-21 MED ORDER — POLYETHYLENE GLYCOL 3350 17 G PO PACK
17.0000 g | PACK | Freq: Two times a day (BID) | ORAL | Status: DC
  Administered 2013-12-22: 17 g via ORAL
  Filled 2013-12-21 (×3): qty 1

## 2013-12-21 MED ORDER — ATORVASTATIN CALCIUM 20 MG PO TABS
20.0000 mg | ORAL_TABLET | Freq: Every day | ORAL | Status: DC
  Filled 2013-12-21 (×2): qty 1

## 2013-12-21 MED ORDER — ENOXAPARIN SODIUM 40 MG/0.4ML ~~LOC~~ SOLN
40.0000 mg | SUBCUTANEOUS | Status: DC
  Administered 2013-12-21: 40 mg via SUBCUTANEOUS
  Filled 2013-12-21 (×2): qty 0.4

## 2013-12-21 MED ORDER — PANTOPRAZOLE SODIUM 40 MG PO TBEC
80.0000 mg | DELAYED_RELEASE_TABLET | Freq: Every day | ORAL | Status: DC
  Administered 2013-12-22: 80 mg via ORAL
  Filled 2013-12-21: qty 2

## 2013-12-21 MED ORDER — ONDANSETRON HCL 4 MG/2ML IJ SOLN
4.0000 mg | INTRAMUSCULAR | Status: DC
  Filled 2013-12-21: qty 2

## 2013-12-21 MED ORDER — SODIUM CHLORIDE 0.9 % IV SOLN: INTRAVENOUS | Status: DC

## 2013-12-21 MED ORDER — HYDROCHLOROTHIAZIDE 12.5 MG PO CAPS
12.5000 mg | ORAL_CAPSULE | Freq: Every day | ORAL | Status: DC
  Administered 2013-12-21 – 2013-12-22 (×2): 12.5 mg via ORAL
  Filled 2013-12-21 (×2): qty 1

## 2013-12-21 MED ORDER — LOSARTAN POTASSIUM-HCTZ 100-25 MG PO TABS: 0.5000 | ORAL_TABLET | Freq: Every day | ORAL | Status: DC

## 2013-12-21 MED ORDER — ONDANSETRON HCL 4 MG/2ML IJ SOLN
4.0000 mg | Freq: Four times a day (QID) | INTRAMUSCULAR | Status: DC | PRN
  Administered 2013-12-21: 4 mg via INTRAVENOUS
  Filled 2013-12-21: qty 2

## 2013-12-21 MED ORDER — PROMETHAZINE HCL 25 MG/ML IJ SOLN
12.5000 mg | Freq: Once | INTRAMUSCULAR | Status: DC
  Filled 2013-12-21: qty 1

## 2013-12-21 MED ORDER — HYDROCODONE-ACETAMINOPHEN 5-325 MG PO TABS: 1.0000 | ORAL_TABLET | ORAL | Status: DC | PRN

## 2013-12-21 MED ORDER — LUBIPROSTONE 8 MCG PO CAPS
8.0000 ug | ORAL_CAPSULE | Freq: Two times a day (BID) | ORAL | Status: DC
  Administered 2013-12-22: 8 ug via ORAL
  Filled 2013-12-21 (×4): qty 1

## 2013-12-21 MED ORDER — POTASSIUM CHLORIDE CRYS ER 20 MEQ PO TBCR
40.0000 meq | EXTENDED_RELEASE_TABLET | Freq: Once | ORAL | Status: AC
  Administered 2013-12-21: 40 meq via ORAL
  Filled 2013-12-21: qty 2

## 2013-12-21 MED ORDER — ONDANSETRON HCL 4 MG/2ML IJ SOLN
4.0000 mg | Freq: Once | INTRAMUSCULAR | Status: AC
  Administered 2013-12-21: 4 mg via INTRAVENOUS
  Filled 2013-12-21: qty 2

## 2013-12-21 MED ORDER — POTASSIUM CHLORIDE 10 MEQ/100ML IV SOLN
10.0000 meq | INTRAVENOUS | Status: AC
  Administered 2013-12-21 (×4): 10 meq via INTRAVENOUS
  Filled 2013-12-21 (×4): qty 100

## 2013-12-21 MED ORDER — PROMETHAZINE HCL 25 MG/ML IJ SOLN
12.5000 mg | Freq: Four times a day (QID) | INTRAMUSCULAR | Status: DC
  Administered 2013-12-21 – 2013-12-22 (×4): 12.5 mg via INTRAVENOUS
  Filled 2013-12-21 (×7): qty 1

## 2013-12-21 NOTE — ED Notes (Signed)
Bed: WA10 Expected date:  Expected time:  Means of arrival:  Comments: EMS-abdominal pain 

## 2013-12-21 NOTE — ED Provider Notes (Signed)
CSN: [id number]     Arrival date & time 12/21/13  1000 History   First MD Initiated Contact with Patient 12/21/13 1000     Chief Complaint  Patient presents with  . Emesis  . Diarrhea     (Consider location/radiation/quality/duration/timing/severity/associated sxs/prior Treatment) HPI 80 year old female presents with vomiting that started in the middle the night several hours ago. The patient denies specific abdominal pain but feels bloated. The son and patient relate that this bloating is not new and she's been having this for several years. She has chronic GI problems and no specific diagnosis has been found. She chronically alternating constipation and diarrhea. She states that while she's been vomiting she's had a little bit of stool, but is specifically not had to go the bathroom. No blood in vomit or stool. Does not have any new abdominal pain. She was given Zofran by EMS and states she feels better and is only mildly nauseous. States she had chills this morning but has not had any fevers. Denies any cough, shortness of breath, chest pain, or dysuria. The patient has had multiple studies for multiple gastroenterologist over the years and is hoping to "just find out what is going on". She states she has been discharged for multiple GI practices. She is hoping for a GI referral or consultation in the ER. Normally when she has her GI issues she has nausea without vomiting and so the new vomiting today concerned them.  Past Medical History  Diagnosis Date  . Hyperlipidemia   . Hypertension   . Coronary artery disease     2v CABG @ [facility]  . Scoliosis   . Spina bifida   . Osteoarthritis   . Iatrogenic Cushing's disease   . Hypercholesteremia   . Allergic rhinitis   . Multiple allergies     induced by pain: swelling in UE/face/torso, nausea, hives followed by hypotension unless tx with high dose steroids   Past Surgical History  Procedure Laterality Date  . Cardiac catheterization   11/2006  . Total knee arthroplasty  09/2010    right  . Cholecystectomy, laparoscopic  05/27/2010  . Coronary angioplasty  11/2006    left anterior descending vessel  . Cardiovascular stress test  09/19/2010  . Cholecystectomy  2011  . Appendectomy  1960  . Tonsillectomy  1943  . Abdominal hysterectomy  1981 or 84  . Surgery for scoliosis  1995  . Hardware removed in spine due to loos screws  1997   Family History  Problem Relation Age of Onset  . Cancer Father 70    colon  . Arthritis Father   . Arthritis Mother   . Heart disease Other   . Hypertension Other    History  Substance Use Topics  . Smoking status: Never Smoker   . Smokeless tobacco: Not on file     Comment: married, lives with spouse. 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 Alcohol Use: No   OB History   Grav Para Term Preterm Abortions TAB SAB Ect Mult Living                 Review of Systems  Constitutional: Positive for chills. Negative for fever.  Respiratory: Negative for cough and shortness of breath.   Cardiovascular: Negative for chest pain.  Gastrointestinal: Positive for nausea, vomiting, diarrhea and abdominal distention (not new today). Negative for abdominal pain and constipation.  Genitourinary: Negative for dysuria.  Musculoskeletal: Negative for back pain (chronic back pain,  not new or worse today).  All other systems reviewed and are negative.     Allergies  Cardura; Clarithromycin; Erythromycin; Guaifenesin; and Ketorolac tromethamine  Home Medications   Current Outpatient Rx  Name  Route  Sig  Dispense  Refill  . AMITIZA 8 MCG capsule      1 tab daily occ         . amLODipine (NORVASC) 5 MG tablet   Oral   Take 5 mg by mouth daily.         Marland Kitchen aspirin 81 MG EC tablet   Oral   Take 81 mg by mouth daily.           Marland Kitchen atorvastatin (LIPITOR) 20 MG tablet   Oral   Take 1 tablet (20 mg total) by mouth daily.   90 tablet   3     PATIENT WILL  CALL WHEN SHE NEEDS A REFILL   . diazepam (VALIUM) 5 MG tablet   Oral   Take 5 mg by mouth daily as needed for anxiety.         . docusate sodium (COLACE) 100 MG capsule   Oral   Take 100 mg by mouth 2 (two) times daily.          Marland Kitchen esomeprazole (NEXIUM) 40 MG capsule   Oral   Take 40 mg by mouth daily before breakfast.         . fluticasone (FLONASE) 50 MCG/ACT nasal spray      PLACE 2 SPRAYS INTO THE NOSE DAILY.   16 g   6   . fluticasone (FLONASE) 50 MCG/ACT nasal spray   Each Nare   Place 2 sprays into both nostrils daily.         Marland Kitchen lidocaine (LIDODERM) 5 %   Transdermal   Place 1 patch onto the skin daily. Remove & Discard patch within 12 hours or as directed by MD         . Rolan Lipa 145 MCG CAPS capsule   Oral   Take 145 mcg by mouth daily.          . Loperamide HCl (IMODIUM A-D PO)   Oral   Take 1 tablet by mouth daily as needed (loose stool). As needed         . meclizine (ANTIVERT) 25 MG tablet      TAKE 1 TABLET THREE TIMES A DAY AS NEEDED   90 tablet   2   . metoprolol (LOPRESSOR) 50 MG tablet   Oral   Take 50 mg by mouth daily.         . nitroGLYCERIN (NITROSTAT) 0.4 MG SL tablet   Sublingual   Place 0.4 mg under the tongue every 5 (five) minutes as needed.           . ondansetron (ZOFRAN) 8 MG tablet   Oral   Take by mouth every 8 (eight) hours as needed.         . polyethylene glycol (MIRALAX / GLYCOLAX) packet   Oral   Take 17 g by mouth 2 (two) times daily.         . potassium chloride (MICRO-K) 10 MEQ CR capsule   Oral   Take 10 mEq by mouth 2 (two) times daily.         . Probiotic Product (ALIGN PO)   Oral   Take 1 tablet by mouth daily.          . promethazine (PHENERGAN)  25 MG tablet   Oral   Take 1 tablet (25 mg total) by mouth every 6 (six) hours as needed.   30 tablet   1   . simethicone (MYLICON) 80 MG chewable tablet   Oral   Chew 80 mg by mouth every 6 (six) hours as needed. Gas x as needed          . losartan-hydrochlorothiazide (HYZAAR) 100-25 MG per tablet   Oral   Take 0.5 tablets by mouth daily. Taking 1/2 daily          BP 129/70  Pulse 93  Temp(Src) 98.7 F (37.1 C) (Oral)  Resp 16  SpO2 91% Physical Exam  Vitals reviewed. Constitutional: She is oriented to person, place, and time. She appears well-developed and well-nourished. No distress.  HENT:  Head: Normocephalic and atraumatic.  Right Ear: External ear normal.  Left Ear: External ear normal.  Nose: Nose normal.  Eyes: Right eye exhibits no discharge. Left eye exhibits no discharge.  Cardiovascular: Normal rate, regular rhythm and normal heart sounds.   Pulmonary/Chest: Effort normal and breath sounds normal.  Abdominal: Soft. She exhibits no distension. There is no tenderness.  Neurological: She is alert and oriented to person, place, and time.  Skin: Skin is warm and dry.    ED Course  Procedures (including critical care time) Labs Review Labs Reviewed  CBC - Abnormal; Notable for the following:    WBC 17.3 (*)    All other components within normal limits  COMPREHENSIVE METABOLIC PANEL - Abnormal; Notable for the following:    Sodium 133 (*)    Potassium 2.8 (*)    Chloride 90 (*)    Glucose, Bld 111 (*)    Albumin 3.1 (*)    GFR calc non Af Amer 83 (*)    All other components within normal limits  LIPASE, BLOOD  URINALYSIS, ROUTINE W REFLEX MICROSCOPIC  CBC  CREATININE, SERUM   Imaging Review Dg Abd Acute W/chest  12/21/2013   CLINICAL DATA:  Bloating and vomiting.  EXAM: ACUTE ABDOMEN SERIES (ABDOMEN 2 VIEW & CHEST 1 VIEW)  COMPARISON:  PA and lateral chest 08/09/2012. CT abdomen and pelvis 07/09/2011.  FINDINGS: Single view of the chest demonstrates low lung volumes. There is some mild scarring in the left base, unchanged. Lungs otherwise clear. No pneumothorax or pleural effusion.  Two views of the abdomen show no free intraperitoneal air. The bowel gas pattern is unremarkable. Severe  thoracolumbar scoliosis is noted.  IMPRESSION: No acute finding chest or abdomen.   Electronically Signed   By: Inge Rise M.D.   On: 12/21/2013 12:06     EKG Interpretation None      MDM   Final diagnoses:  Hypokalemia  Vomiting    Patient appears well here, but does appear dehydrated. She has hypokalemia to 2.8. She's already on outpatient oral potassium replacement. This is likely exacerbated by her vomiting combined with her chronic diarrhea. As she is unable to take by mouth in the emergency department she will need admission for IV fluids, potassium, and gastroenterology consult. Her abdomen is soft and no specific tenderness. I do not feel she needs a CT at this point. I discussed her case with [facility], who will consult in the hospital. I discussed with her PCP Dr. Ardeth Perfect who will admit    Ephraim Hamburger, MD 12/21/13 1622

## 2013-12-21 NOTE — ED Notes (Signed)
MD at bedside. 

## 2013-12-21 NOTE — H&P (Signed)
Physician Admission History and Physical     PCP:   Velna Hatchet, MD   Chief Complaint:  Emesis and diarrhea   HPI: Vanessa Nielsen is an 80 y.o. female.  Pt w/ complex medical hx who presents to ED for continued diarrhea (chronic for years) and new onset emesis w/ hypokalemia. She was intolerant to PO in the ED so will be admitted for treatment , IVF , K repletion, and GI consult to look into her continued diarrhea   Review of Systems:  Neg except as noted above    Past History Past Medical History (reviewed - no changes required): scoliosis and diastomatomyelia (2 spinal cords) - Dr Ellene Route diagnosed this, now followed by Dr Hardin Negus and Patrice Paradise  recurrent UTI 2/2 diarrhea  HTN // HLD // CAD w/ single artery bypass - Dr Mare Ferrari  chronic nausea, diarrhea, constipation  **reaction to pain that = rapid hypotension** occurs a few times a year, w/u'd by allergy in past w/o success,      - responds to 20-40mg  PO steroids   *EGG performed 06/24/12  *CT A/P w/o contrast 07/09/11 - ??spina bifida?? congential T/L/S abnormalities. coronary athersclerosis. stable hepatic cysts. R hypodense liver lesion that is decreased in size to 1.8x1 cm from 2.5 x 1.3 cm. normal spleen, adrenal, parncreas *normal gastric emptying study 08/01/11  * hydrogen breath test 10/26/12 - negative for bacterial overgrowth   ( Dr Derrill Kay * [facility] )( Dr Mare Ferrari * cards )( Dr Hardin Negus * pain management )( Dr Lynann Bologna * prn ortho )( Dr Patrice Paradise * ortho/spine ) ( Dr Sabra Heck * optho)  Surgical History (reviewed - no changes required): c sections  TAH,  BSO 2/2 menorrhagia   breast reduction surgery  cardiac bypass surgery?? 1 artery ??  choley  R knee arthroplasty -- [facility]   Family History (reviewed - no changes required): dad - colon cancer at 34 mom - brain surgery  extensive hx of MI/CVA  Social History (reviewed - no changes required): married. masters. retired. needs help bathing. she makes own medical  decisions.  ethics: "she is ready to go when its time to go" full code but not wanting to be long term intubated  tob: never etoh: past drugs: none    Complete Medication List: 1)  Amitiza 8 Mcg Caps (Lubiprostone) .... Take 1 tab bid prn constipation 2)  Linzess 145 Mcg Caps (Linaclotide) .... Take 1 tab daily as needed 3)  Meclizine Hcl 25 Mg Tabs (Meclizine hcl) .... Take 1 tablet morning noon and night until dizziness goes away 4)  Cvs Vitamin D3 1000 Unit Caps (Cholecalciferol) .... Take 1 tab daily 5)  Calcium 600+d3 600-400 Mg-unit Tabs (Calcium carbonate-vitamin d) .... Take 1 tab bid 6)  Prednisone 20 Mg Tabs (Prednisone) .... During acute episodes, take 1 -2  tabs every 6 hours as needed 7)  Imodium A-d 2 Mg Tabs (Loperamide hcl) .... Take as needed 8)  Gas-x 80 Mg Chew (Simethicone) .... Chew as needed 9)  Valium 5 Mg Tabs (Diazepam) .... Take as needed 10)  Ondansetron Hcl 4 Mg Tabs (Ondansetron hcl) .... Take as needed 11)  Promethazine Hcl 25 Mg Tabs (Promethazine hcl) .... Take as needed 12)  Align Caps (Probiotic product) .... Take 1 capsule by mouth every day 13)  Aspirin 81 Mg Tabs (Aspirin) .... Take 1 tablet by mouth every day 14)  Lidocaine 5 % Ptch (Lidocaine) .... Apply to skin twice a day 15)  Nexium 40 Mg  Cpdr (Esomeprazole magnesium) .... Take 1 capsule qac 16)  Lipitor 10 Mg Tabs (Atorvastatin calcium) .... Take 1 tablet by mouth every day 17)  Norvasc 5 Mg Tabs (Amlodipine besylate) .... Take 1 tablet by mouth every day 18)  Micro-k 10 Meq Cr-caps (Potassium chloride) .... Take 4 capsules by mouth every day 19)  Hyzaar 100-25 Mg Tabs (Losartan potassium-hctz) .... Take 1 tablet by mouth every day 20)  Lopressor 50 Mg Tabs (Metoprolol tartrate) .... Take 1 tab bid   Allergies:   Allergies  Allergen Reactions  . Cardura [Doxazosin Mesylate]   . Clarithromycin   . Erythromycin   . Guaifenesin   . Ketorolac Tromethamine     Physical Exam: Filed  Vitals:   12/21/13 1009 12/21/13 1230  BP: 129/70   Pulse: 93 92  Temp: 98.7 F (37.1 C)   TempSrc: Oral   Resp: 16 17  SpO2: 91% 94%   Gen: WF in NAD  HEENT: anicteric, dry MM Lungs: CTAB, no wheezes, rales  Cardio:  RRR, no MRG  Abd:  Soft, mildly tender in epigastrium  MSK:  No focal deficits  Neuro:  No focal deficits  Skin: warm, dry     Labs on Admission:   Recent Labs  12/21/13 1105  NA 133*  K 2.8*  CL 90*  CO2 30  GLUCOSE 111*  BUN 10  CREATININE 0.65  CALCIUM 8.4    Recent Labs  12/21/13 1105  AST 15  ALT 7  ALKPHOS 113  BILITOT 0.7  PROT 6.6  ALBUMIN 3.1*    Recent Labs  12/21/13 1105  LIPASE 19    Recent Labs  12/21/13 1105  WBC 17.3*  HGB 14.5  HCT 41.9  MCV 86.2  PLT 298   No results found for this basename: CKTOTAL, CKMB, CKMBINDEX, TROPONINI,  in the last 72 hours Lab Results  Component Value Date   INR 2.03* 10/17/2010   INR 1.24 10/16/2010   INR 1.03 10/15/2010   No results found for this basename: TSH, T4TOTAL, FREET3, T3FREE, THYROIDAB,  in the last 72 hours No results found for this basename: VITAMINB12, FOLATE, FERRITIN, TIBC, IRON, RETICCTPCT,  in the last 72 hours  Radiological Exams on Admission: Dg Abd Acute W/chest  12/21/2013   CLINICAL DATA:  Bloating and vomiting.  EXAM: ACUTE ABDOMEN SERIES (ABDOMEN 2 VIEW & CHEST 1 VIEW)  COMPARISON:  PA and lateral chest 08/09/2012. CT abdomen and pelvis 07/09/2011.  FINDINGS: Single view of the chest demonstrates low lung volumes. There is some mild scarring in the left base, unchanged. Lungs otherwise clear. No pneumothorax or pleural effusion.  Two views of the abdomen show no free intraperitoneal air. The bowel gas pattern is unremarkable. Severe thoracolumbar scoliosis is noted.  IMPRESSION: No acute finding chest or abdomen.   Electronically Signed   By: Inge Rise M.D.   On: 12/21/2013 12:06   Orders placed in visit on 04/06/13  . EKG 12-LEAD     Assessment/Plan Active Problems:   Hypokalemia  - replenish IV and recheck in AM   - was on 105mEq at home and will need to reassess dosing at discharge   Emesis   - SBO or ileus r/o'd w/ KUB   - phenergan scheduled and NS at 125cc/hr   - viral GE is most likely at this time   Diarrhea   - will consult GI  - she is on several stool softeners at home   - only resuming miralax  and amitiza at this time   PPx   - lovenox   Dispo - home in 2-3 days after emesis resolves   [employer] 12/21/2013, 2:46 PM

## 2013-12-21 NOTE — ED Notes (Signed)
Per EMS. Pt from home. Complains of diarrhea for past few years and n/v for past few weeks, worse this am. EMS started IV and gave 4mg  zofran prior to arrival. Pt states she has had bowel problems since surgery several years ago

## 2013-12-21 NOTE — ED Notes (Signed)
Pt had one episode of small amount of clear emesis

## 2013-12-22 DIAGNOSIS — R112 Nausea with vomiting, unspecified: Secondary | ICD-10-CM | POA: Diagnosis not present

## 2013-12-22 DIAGNOSIS — R1114 Bilious vomiting: Secondary | ICD-10-CM | POA: Diagnosis not present

## 2013-12-22 DIAGNOSIS — R198 Other specified symptoms and signs involving the digestive system and abdomen: Secondary | ICD-10-CM | POA: Diagnosis not present

## 2013-12-22 DIAGNOSIS — E876 Hypokalemia: Secondary | ICD-10-CM | POA: Diagnosis not present

## 2013-12-22 DIAGNOSIS — R197 Diarrhea, unspecified: Secondary | ICD-10-CM | POA: Diagnosis not present

## 2013-12-22 LAB — CBC
HEMATOCRIT: 38.9 % (ref 36.0–46.0)
HEMOGLOBIN: 13 g/dL (ref 12.0–15.0)
MCH: 29.4 pg (ref 26.0–34.0)
MCHC: 33.4 g/dL (ref 30.0–36.0)
MCV: 88 fL (ref 78.0–100.0)
Platelets: 262 10*3/uL (ref 150–400)
RBC: 4.42 MIL/uL (ref 3.87–5.11)
RDW: 13.3 % (ref 11.5–15.5)
WBC: 10.1 10*3/uL (ref 4.0–10.5)

## 2013-12-22 LAB — COMPREHENSIVE METABOLIC PANEL
ALBUMIN: 2.4 g/dL — AB (ref 3.5–5.2)
ALK PHOS: 84 U/L (ref 39–117)
ALT: 7 U/L (ref 0–35)
AST: 15 U/L (ref 0–37)
BUN: 6 mg/dL (ref 6–23)
CALCIUM: 7.5 mg/dL — AB (ref 8.4–10.5)
CO2: 24 mEq/L (ref 19–32)
Chloride: 98 mEq/L (ref 96–112)
Creatinine, Ser: 0.62 mg/dL (ref 0.50–1.10)
GFR calc non Af Amer: 85 mL/min — ABNORMAL LOW (ref >90)
Glucose, Bld: 94 mg/dL (ref 70–99)
Potassium: 3.4 mEq/L — ABNORMAL LOW (ref 3.7–5.3)
Sodium: 134 mEq/L — ABNORMAL LOW (ref 137–147)
TOTAL PROTEIN: 5.3 g/dL — AB (ref 6.0–8.3)
Total Bilirubin: 0.4 mg/dL (ref 0.3–1.2)

## 2013-12-22 MED ORDER — PROMETHAZINE HCL 25 MG PO TABS: 25.0000 mg | ORAL_TABLET | Freq: Four times a day (QID) | ORAL | Status: DC | PRN

## 2013-12-22 MED ORDER — POTASSIUM CHLORIDE CRYS ER 10 MEQ PO TBCR
10.0000 meq | EXTENDED_RELEASE_TABLET | Freq: Two times a day (BID) | ORAL | Status: DC
  Administered 2013-12-22: 10 meq via ORAL
  Filled 2013-12-22 (×2): qty 1

## 2013-12-22 MED ORDER — LOPERAMIDE HCL 2 MG PO CAPS
2.0000 mg | ORAL_CAPSULE | ORAL | Status: DC | PRN
  Filled 2013-12-22: qty 1

## 2013-12-22 NOTE — Plan of Care (Signed)
Patient is feeling much better this AM. Nausea improved, leukocytosis resolved, ready to eat. Low grade fever overnight. Will challenge w/ meals today and if tolerates, consider discharge this evening w/ f/u in my office on Monday. This was likely viral GE. Full note to follow   Also, resuming home KCl w/ recheck in office on Monday if discharged  Velna Hatchet, MD 12/22/2013 9:42 AM

## 2013-12-22 NOTE — Discharge Summary (Signed)
Physician Discharge Summary    Vanessa Nielsen  MR#: [id number]  DOB:09/17/1933  Date of Admission: 12/21/2013 Date of Discharge: 12/22/2013  Attending Physician:Shiloh Swopes  Patient's UXN:[id number], Nicki Reaper, MD  Consults:Treatment Team:  Arta Silence, MD    Discharge Diagnoses: Active Problems:   Hypokalemia   Emesis, persistent   Discharge Medications:   Medication List         ALIGN PO  Take 1 tablet by mouth daily.     AMITIZA 8 MCG capsule  Generic drug:  lubiprostone  1 tab daily occ     amLODipine 5 MG tablet  Commonly known as:  NORVASC  Take 5 mg by mouth daily.     aspirin 81 MG EC tablet  Take 81 mg by mouth daily.     atorvastatin 20 MG tablet  Commonly known as:  LIPITOR  Take 1 tablet (20 mg total) by mouth daily.     diazepam 5 MG tablet  Commonly known as:  VALIUM  Take 5 mg by mouth daily as needed for anxiety.     docusate sodium 100 MG capsule  Commonly known as:  COLACE  Take 100 mg by mouth 2 (two) times daily.     esomeprazole 40 MG capsule  Commonly known as:  NEXIUM  Take 40 mg by mouth daily before breakfast.     fluticasone 50 MCG/ACT nasal spray  Commonly known as:  FLONASE  PLACE 2 SPRAYS INTO THE NOSE DAILY.     fluticasone 50 MCG/ACT nasal spray  Commonly known as:  FLONASE  Place 2 sprays into both nostrils daily.     IMODIUM A-D PO  Take 1 tablet by mouth daily as needed (loose stool). As needed     lidocaine 5 %  Commonly known as:  LIDODERM  Place 1 patch onto the skin daily. Remove & Discard patch within 12 hours or as directed by MD     LINZESS 145 MCG Caps capsule  Generic drug:  Linaclotide  Take 145 mcg by mouth daily.     losartan-hydrochlorothiazide 100-25 MG per tablet  Commonly known as:  HYZAAR  Take 0.5 tablets by mouth daily. Taking 1/2 daily     meclizine 25 MG tablet  Commonly known as:  ANTIVERT  TAKE 1 TABLET THREE TIMES A DAY AS NEEDED     metoprolol 50 MG tablet  Commonly known  as:  LOPRESSOR  Take 50 mg by mouth daily.     nitroGLYCERIN 0.4 MG SL tablet  Commonly known as:  NITROSTAT  Place 0.4 mg under the tongue every 5 (five) minutes as needed.     ondansetron 8 MG tablet  Commonly known as:  ZOFRAN  Take by mouth every 8 (eight) hours as needed.     polyethylene glycol packet  Commonly known as:  MIRALAX / GLYCOLAX  Take 17 g by mouth 2 (two) times daily.     potassium chloride 10 MEQ CR capsule  Commonly known as:  MICRO-K  Take 10 mEq by mouth 2 (two) times daily.     promethazine 25 MG tablet  Commonly known as:  PHENERGAN  Take 1 tablet (25 mg total) by mouth every 6 (six) hours as needed.     simethicone 80 MG chewable tablet  Commonly known as:  MYLICON  Chew 80 mg by mouth every 6 (six) hours as needed. Gas x as needed        Hospital Procedures: Dg Abd Acute W/chest  12/21/2013  CLINICAL DATA:  Bloating and vomiting.  EXAM: ACUTE ABDOMEN SERIES (ABDOMEN 2 VIEW & CHEST 1 VIEW)  COMPARISON:  PA and lateral chest 08/09/2012. CT abdomen and pelvis 07/09/2011.  FINDINGS: Single view of the chest demonstrates low lung volumes. There is some mild scarring in the left base, unchanged. Lungs otherwise clear. No pneumothorax or pleural effusion.  Two views of the abdomen show no free intraperitoneal air. The bowel gas pattern is unremarkable. Severe thoracolumbar scoliosis is noted.  IMPRESSION: No acute finding chest or abdomen.   Electronically Signed   By: Inge Rise M.D.   On: 12/21/2013 12:06    History of Present Illness: Admitted for emesis, intol to PO and hypokalemia   Hospital Course: Emesis - resolved w/ phenergan and IVF . WBC resolved . Fever abated. This was likely 2/2 viral GE   Hypokalemia - in setting of emesis that has resolved . replinished in house. Will send home back on home meds   Diarrhea - chronic. Seen by Dr Paulita Fujita. Will follow up as outpt   She was tolerating diet, vitals and labs stable and ready for  discharge home   Day of Discharge Exam BP 152/72  Pulse 82  Temp(Src) 99.7 F (37.6 C) (Oral)  Resp 18  Ht 5' (1.524 m)  Wt 87.454 kg (192 lb 12.8 oz)  BMI 37.65 kg/m2  SpO2 98%  Physical Exam: General appearance: WF in NAD  Eyes: no scleral icterus Throat: oropharynx moist without erythema Resp: CTAB, no wheezes, rales  Cardio: RRR, no MRG GI: soft, non-tender; bowel sounds normal; no masses,  no organomegaly Extremities: no clubbing, cyanosis or edema  Discharge Labs:  Recent Labs  12/21/13 1105 12/22/13 0405  NA 133* 134*  K 2.8* 3.4*  CL 90* 98  CO2 30 24  GLUCOSE 111* 94  BUN 10 6  CREATININE 0.65 0.62  CALCIUM 8.4 7.5*    Recent Labs  12/21/13 1105 12/22/13 0405  AST 15 15  ALT 7 7  ALKPHOS 113 84  BILITOT 0.7 0.4  PROT 6.6 5.3*  ALBUMIN 3.1* 2.4*    Recent Labs  12/21/13 1105 12/22/13 0405  WBC 17.3* 10.1  HGB 14.5 13.0  HCT 41.9 38.9  MCV 86.2 88.0  PLT 298 262   Lab Results  Component Value Date   INR 2.03* 10/17/2010   INR 1.24 10/16/2010   INR 1.03 10/15/2010   No results found for this basename: CKTOTAL, CKMB, CKMBINDEX, TROPONINI,  in the last 72 hours No results found for this basename: TSH, T4TOTAL, FREET3, T3FREE, THYROIDAB,  in the last 72 hours No results found for this basename: VITAMINB12, FOLATE, FERRITIN, TIBC, IRON, RETICCTPCT,  in the last 72 hours  Discharge instructions:     Discharge Orders   Future Appointments Provider Department Dept Phone   02/24/2014 9:15 AM [facility] [place] Office [phone]   03/02/2014 2:45 PM Darlin Coco, MD [place] Office [phone]   Future Orders Complete By Expires   Call MD for:  persistant nausea and vomiting  As directed    Diet - low sodium heart healthy  As directed    Increase activity slowly  As directed      01-Home or Self Care   Disposition: home   Follow-up Appts: Follow-up with Dr. Ardeth Perfect at [facility] in 7-14 days.  Call for appointment.  Condition on Discharge: stable   Tests Needing Follow-up: [facility] ,   Time spent in  discharge (includes decision making & examination of pt): 45 minutes    Signed: Velna Hatchet 12/22/2013, 2:05 PM

## 2013-12-22 NOTE — Care Management Note (Signed)
    Page 1 of 1   12/22/2013     12:09:00 PM   CARE MANAGEMENT NOTE 12/22/2013  Patient:  Vanessa Nielsen, Vanessa Nielsen   Account Number:  [id number]  Date Initiated:  12/22/2013  Documentation initiated by:  [facility]  Subjective/Objective Assessment:   [address] [place].     Action/Plan:   FROM HOME.   Anticipated DC Date:  12/23/2013   Anticipated DC Plan:  [place]  CM consult      Choice offered to / List presented to:             Status of service:  In process, will continue to follow Medicare Important Message given?   (If response is "NO", the following Medicare IM given date fields will be blank) Date Medicare IM given:   Date Additional Medicare IM given:    Discharge Disposition:    Per UR Regulation:  Reviewed for med. necessity/level of care/duration of stay  If discussed at [place] of Stay Meetings, dates discussed:    Comments:  12/22/13 [facility] RN,BSN NCM [phone]

## 2013-12-22 NOTE — Consult Note (Signed)
[facility] Consultation Note  Referring Provider: Dr. Velna Hatchet Primary Care Physician:  Velna Hatchet, MD  Reason for Consultation:  Nausea, vomiting, diarrhea.  HPI: Vanessa Nielsen is a 80 y.o. female admitted for nausea, vomiting, diarrhea.  Symptoms over the past few days, and have essentially resolved today.  She has many years of chronic bowel problems, typically constipation with straining and incomplete evacuation.  Takes stool softeners and miralax, which typically cause her to have liquid stools.  She doesn't recall the last time she had a solid, formed, "normal" stool.  Has seen Dr. Derrill Kay at [facility] and Dr. Watt Climes here locally for these problems.   Past Medical History  Diagnosis Date  . Hyperlipidemia   . Hypertension   . Coronary artery disease     2v CABG @ [facility]  . Scoliosis   . Spina bifida   . Osteoarthritis   . Iatrogenic Cushing's disease   . Hypercholesteremia   . Allergic rhinitis   . Multiple allergies     induced by pain: swelling in UE/face/torso, nausea, hives followed by hypotension unless tx with high dose steroids    Past Surgical History  Procedure Laterality Date  . Cardiac catheterization  11/2006  . Total knee arthroplasty  09/2010    right  . Cholecystectomy, laparoscopic  05/27/2010  . Coronary angioplasty  11/2006    left anterior descending vessel  . Cardiovascular stress test  09/19/2010  . Cholecystectomy  2011  . Appendectomy  1960  . Tonsillectomy  1943  . Abdominal hysterectomy  1981 or 84  . Surgery for scoliosis  1995  . Hardware removed in spine due to loos screws  1997    Prior to Admission medications   Medication Sig Start Date End Date Taking? Authorizing Provider  AMITIZA 8 MCG capsule 1 tab daily occ 07/03/12  Yes Historical Provider, MD  amLODipine (NORVASC) 5 MG tablet Take 5 mg by mouth daily.   Yes Historical Provider, MD  aspirin 81 MG EC tablet Take 81 mg by mouth daily.     Yes Historical  Provider, MD  atorvastatin (LIPITOR) 20 MG tablet Take 1 tablet (20 mg total) by mouth daily. 04/06/13  Yes Darlin Coco, MD  diazepam (VALIUM) 5 MG tablet Take 5 mg by mouth daily as needed for anxiety.   Yes Historical Provider, MD  docusate sodium (COLACE) 100 MG capsule Take 100 mg by mouth 2 (two) times daily.    Yes Historical Provider, MD  esomeprazole (NEXIUM) 40 MG capsule Take 40 mg by mouth daily before breakfast.   Yes Historical Provider, MD  fluticasone (FLONASE) 50 MCG/ACT nasal spray PLACE 2 SPRAYS INTO THE NOSE DAILY. 08/09/13  Yes Darlin Coco, MD  fluticasone (FLONASE) 50 MCG/ACT nasal spray Place 2 sprays into both nostrils daily.   Yes Historical Provider, MD  lidocaine (LIDODERM) 5 % Place 1 patch onto the skin daily. Remove & Discard patch within 12 hours or as directed by MD   Yes Historical Provider, MD  LINZESS 145 MCG CAPS capsule Take 145 mcg by mouth daily.  10/09/13  Yes Historical Provider, MD  Loperamide HCl (IMODIUM A-D PO) Take 1 tablet by mouth daily as needed (loose stool). As needed   Yes Historical Provider, MD  meclizine (ANTIVERT) 25 MG tablet TAKE 1 TABLET THREE TIMES A DAY AS NEEDED 05/18/13  Yes Darlin Coco, MD  metoprolol (LOPRESSOR) 50 MG tablet Take 50 mg by mouth daily.   Yes Historical Provider, MD  nitroGLYCERIN (NITROSTAT) 0.4 MG SL tablet Place 0.4 mg under the tongue every 5 (five) minutes as needed.     Yes Historical Provider, MD  ondansetron (ZOFRAN) 8 MG tablet Take by mouth every 8 (eight) hours as needed.   Yes Historical Provider, MD  polyethylene glycol (MIRALAX / GLYCOLAX) packet Take 17 g by mouth 2 (two) times daily.   Yes Historical Provider, MD  potassium chloride (MICRO-K) 10 MEQ CR capsule Take 10 mEq by mouth 2 (two) times daily. 03/31/13  Yes Darlin Coco, MD  Probiotic Product (ALIGN PO) Take 1 tablet by mouth daily.    Yes Historical Provider, MD  promethazine (PHENERGAN) 25 MG tablet Take 1 tablet (25 mg total) by  mouth every 6 (six) hours as needed. 07/01/12  Yes Biagio Borg, MD  simethicone (MYLICON) 80 MG chewable tablet Chew 80 mg by mouth every 6 (six) hours as needed. Gas x as needed   Yes Historical Provider, MD  losartan-hydrochlorothiazide (HYZAAR) 100-25 MG per tablet Take 0.5 tablets by mouth daily. Taking 1/2 daily 01/02/11   Darlin Coco, MD    Current Facility-Administered Medications  Medication Dose Route Frequency Provider Last Rate Last Dose  . amLODipine (NORVASC) tablet 5 mg  5 mg Oral Daily Velna Hatchet, MD   5 mg at 12/22/13 0939  . aspirin chewable tablet 81 mg  81 mg Oral Daily Velna Hatchet, MD   81 mg at 12/22/13 0940  . atorvastatin (LIPITOR) tablet 20 mg  20 mg Oral q1800 Velna Hatchet, MD      . diazepam (VALIUM) tablet 5 mg  5 mg Oral Daily PRN Velna Hatchet, MD      . enoxaparin (LOVENOX) injection 40 mg  40 mg Subcutaneous Q24H Velna Hatchet, MD   40 mg at 12/21/13 1711  . fluticasone (FLONASE) 50 MCG/ACT nasal spray 2 spray  2 spray Each Nare Daily Velna Hatchet, MD      . losartan (COZAAR) tablet 50 mg  50 mg Oral Daily Velna Hatchet, MD   50 mg at 12/22/13 0940   And  . hydrochlorothiazide (MICROZIDE) capsule 12.5 mg  12.5 mg Oral Daily Velna Hatchet, MD   12.5 mg at 12/22/13 0940  . HYDROcodone-acetaminophen (NORCO/VICODIN) 5-325 MG per tablet 1-2 tablet  1-2 tablet Oral Q4H PRN Velna Hatchet, MD      . loperamide (IMODIUM) capsule 2 mg  2 mg Oral PRN Geoffery Lyons, MD      . lubiprostone (AMITIZA) capsule 8 mcg  8 mcg Oral BID WC Velna Hatchet, MD   8 mcg at 12/22/13 0854  . metoprolol (LOPRESSOR) tablet 50 mg  50 mg Oral Daily Velna Hatchet, MD   50 mg at 12/22/13 0938  . ondansetron (ZOFRAN) injection 4 mg  4 mg Intravenous QID PRN Leanna Battles, MD   4 mg at 12/21/13 2040  . pantoprazole (PROTONIX) EC tablet 80 mg  80 mg Oral Q1200 Velna Hatchet, MD   80 mg at 12/22/13 1153  . polyethylene glycol (MIRALAX / GLYCOLAX) packet 17 g  17 g Oral  BID Velna Hatchet, MD   17 g at 12/22/13 0932  . potassium chloride (K-DUR,KLOR-CON) CR tablet 10 mEq  10 mEq Oral BID Velna Hatchet, MD   10 mEq at 12/22/13 1153  . promethazine (PHENERGAN) injection 12.5 mg  12.5 mg Intravenous 4 times per day Velna Hatchet, MD   12.5 mg at 12/22/13 1152  . promethazine (PHENERGAN) tablet 25 mg  25 mg Oral Q6H  PRN Velna Hatchet, MD      . sodium chloride 0.9 % injection 3 mL  3 mL Intravenous Q12H Velna Hatchet, MD        Allergies as of 12/21/2013 - Review Complete 12/21/2013  Allergen Reaction Noted  . Cardura [doxazosin mesylate]  02/21/2011  . Clarithromycin  02/21/2011  . Erythromycin  02/21/2011  . Guaifenesin  02/21/2011  . Ketorolac tromethamine  02/21/2011    Family History  Problem Relation Age of Onset  . Cancer Father 60    colon  . Arthritis Father   . Arthritis Mother   . Heart disease Other   . Hypertension Other     History   Social History  . Marital Status: Married    Spouse Name: N/A    Number of Children: N/A  . Years of Education: N/A   Occupational History  . retired    Social History Main Topics  . Smoking status: Never Smoker   . Smokeless tobacco: Not on file     Comment: married, lives with spouse. 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 Alcohol Use: No  . Drug Use: Not on file  . Sexual Activity: Not on file   Other Topics Concern  . Not on file   Social History Narrative  . No narrative on file    Review of Systems: As per HPI, all others negative  Physical Exam: Vital signs in last 24 hours: Temp:  [98.3 F (36.8 C)-100.4 F (38 C)] 99.7 F (37.6 C) (04/09 0412) Pulse Rate:  [82-83] 82 (04/09 0412) Resp:  [16-18] 18 (04/09 0412) BP: (148-160)/(63-82) 152/72 mmHg (04/09 0412) SpO2:  [91 %-98 %] 98 % (04/09 0412) Weight:  [86.365 kg (190 lb 6.4 oz)-87.454 kg (192 lb 12.8 oz)] 87.454 kg (192 lb 12.8 oz) (04/09 0500) Last BM Date: 12/21/13 General:   Alert,   Overweight, scoliosis Head:  Normocephalic and atraumatic. Eyes:  Sclera clear, no icterus.   Conjunctiva pink. Ears:  Normal auditory acuity. Nose:  No deformity, discharge,  or lesions. Mouth:  No deformity or lesions.  Oropharynx pink & moist. Neck:  Supple; no masses or thyromegaly. Lungs:  Clear throughout to auscultation.   No wheezes, crackles, or rhonchi. No acute distress. Heart:  Regular rate and rhythm; no murmurs, clicks, rubs,  or gallops. Abdomen:  Soft, protuberant, nontender and nondistended. No masses, hepatosplenomegaly or hernias noted. Normal bowel sounds, without guarding, and without rebound.    Prior abdominal surgeries Msk:  Multiple back surgeries; scoliosis Pulses:  Normal pulses noted. Extremities:  Without clubbing or edema. Neurologic:  Alert and  oriented x4;  Diffusely weak, wheelchair-bound, non-focal Skin:  Intact without significant lesions or rashes. Psych:  Alert and cooperative. Normal mood and affect.   Lab Results:  Recent Labs  12/21/13 1105 12/22/13 0405  WBC 17.3* 10.1  HGB 14.5 13.0  HCT 41.9 38.9  PLT 298 262   BMET  Recent Labs  12/21/13 1105 12/22/13 0405  NA 133* 134*  K 2.8* 3.4*  CL 90* 98  CO2 30 24  GLUCOSE 111* 94  BUN 10 6  CREATININE 0.65 0.62  CALCIUM 8.4 7.5*   LFT  Recent Labs  12/22/13 0405  PROT 5.3*  ALBUMIN 2.4*  AST 15  ALT 7  ALKPHOS 84  BILITOT 0.4   PT/INR No results found for this basename: LABPROT, INR,  in the last 72 hours  Studies/Results: Dg Abd Acute W/chest  12/21/2013  CLINICAL DATA:  Bloating and vomiting.  EXAM: ACUTE ABDOMEN SERIES (ABDOMEN 2 VIEW & CHEST 1 VIEW)  COMPARISON:  PA and lateral chest 08/09/2012. CT abdomen and pelvis 07/09/2011.  FINDINGS: Single view of the chest demonstrates low lung volumes. There is some mild scarring in the left base, unchanged. Lungs otherwise clear. No pneumothorax or pleural effusion.  Two views of the abdomen show no free intraperitoneal  air. The bowel gas pattern is unremarkable. Severe thoracolumbar scoliosis is noted.  IMPRESSION: No acute finding chest or abdomen.   Electronically Signed   By: Inge Rise M.D.   On: 12/21/2013 12:06    Impression:  1.  Acute onset nausea, vomiting, diarrhea.  Suspect viral gastroenteritis. 2.  Chronic nausea and irregular bowel habits, in setting of back surgeries.  Suspect some component of colonic dysmotility perhaps from nerve root irritation in light of her back troubles.  Plan:  1.  Patient is improving from her acute symptoms; I agree with discharge home. 2.  Patient would benefit from outpatient GI follow-up; I discussed multiple possible things to consider including Sitz marker study and anorectal manometry.  I counseled patient that her symptoms have been many years in the making, and, thus, it is unlikely that improvement will be rapid.   LOS: 1 day   Arta Silence  12/22/2013, 1:23 PM

## 2013-12-26 DIAGNOSIS — E876 Hypokalemia: Secondary | ICD-10-CM | POA: Diagnosis not present

## 2014-01-04 DIAGNOSIS — R195 Other fecal abnormalities: Secondary | ICD-10-CM | POA: Diagnosis not present

## 2014-01-16 ENCOUNTER — Ambulatory Visit
Admission: RE | Admit: 2014-01-16 | Discharge: 2014-01-16 | Disposition: A | Discharge status: Home / Self Care | Payer: Medicare Other | Source: Ambulatory Visit | Attending: Gastroenterology | Admitting: Gastroenterology

## 2014-01-16 ENCOUNTER — Other Ambulatory Visit: Payer: Self-pay | Admitting: Gastroenterology

## 2014-01-16 DIAGNOSIS — K5900 Constipation, unspecified: Secondary | ICD-10-CM

## 2014-01-17 ENCOUNTER — Ambulatory Visit
Admission: RE | Admit: 2014-01-17 | Discharge: 2014-01-17 | Disposition: A | Discharge status: Home / Self Care | Payer: Medicare Other | Source: Ambulatory Visit | Attending: Gastroenterology | Admitting: Gastroenterology

## 2014-01-17 ENCOUNTER — Other Ambulatory Visit: Payer: Self-pay | Admitting: Gastroenterology

## 2014-01-17 DIAGNOSIS — K5900 Constipation, unspecified: Secondary | ICD-10-CM

## 2014-01-19 ENCOUNTER — Other Ambulatory Visit: Payer: Self-pay | Admitting: Gastroenterology

## 2014-01-19 ENCOUNTER — Ambulatory Visit
Admission: RE | Admit: 2014-01-19 | Discharge: 2014-01-19 | Disposition: A | Discharge status: Home / Self Care | Payer: Medicare Other | Source: Ambulatory Visit | Attending: Gastroenterology | Admitting: Gastroenterology

## 2014-01-19 DIAGNOSIS — K5900 Constipation, unspecified: Secondary | ICD-10-CM | POA: Diagnosis not present

## 2014-01-20 ENCOUNTER — Other Ambulatory Visit: Payer: Self-pay | Admitting: Cardiology

## 2014-01-20 DIAGNOSIS — I10 Essential (primary) hypertension: Secondary | ICD-10-CM | POA: Diagnosis not present

## 2014-01-20 DIAGNOSIS — K5900 Constipation, unspecified: Secondary | ICD-10-CM | POA: Diagnosis not present

## 2014-01-20 DIAGNOSIS — Z6839 Body mass index (BMI) 39.0-39.9, adult: Secondary | ICD-10-CM | POA: Diagnosis not present

## 2014-01-20 DIAGNOSIS — F419 Anxiety disorder, unspecified: Secondary | ICD-10-CM

## 2014-01-20 DIAGNOSIS — R599 Enlarged lymph nodes, unspecified: Secondary | ICD-10-CM | POA: Diagnosis not present

## 2014-01-20 DIAGNOSIS — E876 Hypokalemia: Secondary | ICD-10-CM | POA: Diagnosis not present

## 2014-01-20 DIAGNOSIS — R197 Diarrhea, unspecified: Secondary | ICD-10-CM | POA: Diagnosis not present

## 2014-01-20 DIAGNOSIS — I2510 Atherosclerotic heart disease of native coronary artery without angina pectoris: Secondary | ICD-10-CM | POA: Diagnosis not present

## 2014-01-20 DIAGNOSIS — Z1212 Encounter for screening for malignant neoplasm of rectum: Secondary | ICD-10-CM | POA: Diagnosis not present

## 2014-01-20 MED ORDER — DIAZEPAM 5 MG PO TABS: 5.0000 mg | ORAL_TABLET | Freq: Every day | ORAL | Status: DC | PRN

## 2014-01-20 NOTE — Telephone Encounter (Signed)
New message          Pt would like for melinda to give her a call back.

## 2014-02-02 ENCOUNTER — Other Ambulatory Visit: Payer: Self-pay | Admitting: Cardiology

## 2014-02-13 ENCOUNTER — Ambulatory Visit (HOSPITAL_COMMUNITY)
Admission: RE | Admit: 2014-02-13 | Discharge: 2014-02-13 | Disposition: A | Discharge status: Home / Self Care | Payer: Medicare Other | Source: Ambulatory Visit | Attending: Gastroenterology | Admitting: Gastroenterology

## 2014-02-13 ENCOUNTER — Encounter (HOSPITAL_COMMUNITY)
Admission: RE | Disposition: A | Discharge status: Home / Self Care | Payer: Self-pay | Source: Ambulatory Visit | Attending: Gastroenterology

## 2014-02-13 DIAGNOSIS — R110 Nausea: Secondary | ICD-10-CM | POA: Diagnosis not present

## 2014-02-13 DIAGNOSIS — R159 Full incontinence of feces: Secondary | ICD-10-CM | POA: Diagnosis not present

## 2014-02-13 DIAGNOSIS — K5900 Constipation, unspecified: Secondary | ICD-10-CM | POA: Diagnosis not present

## 2014-02-13 HISTORY — PX: ANAL RECTAL MANOMETRY: SHX6358

## 2014-02-13 SURGERY — MANOMETRY, ANORECTAL

## 2014-02-14 ENCOUNTER — Other Ambulatory Visit: Payer: Self-pay | Admitting: Cardiology

## 2014-02-14 ENCOUNTER — Encounter (HOSPITAL_COMMUNITY): Payer: Self-pay | Admitting: Gastroenterology

## 2014-02-14 DIAGNOSIS — R809 Proteinuria, unspecified: Secondary | ICD-10-CM | POA: Diagnosis not present

## 2014-02-14 DIAGNOSIS — R82998 Other abnormal findings in urine: Secondary | ICD-10-CM | POA: Diagnosis not present

## 2014-02-14 DIAGNOSIS — N390 Urinary tract infection, site not specified: Secondary | ICD-10-CM | POA: Diagnosis not present

## 2014-02-24 ENCOUNTER — Other Ambulatory Visit: Payer: Medicare Other

## 2014-02-27 ENCOUNTER — Other Ambulatory Visit: Payer: Medicare Other

## 2014-02-27 ENCOUNTER — Encounter: Payer: Self-pay | Admitting: Cardiology

## 2014-02-27 DIAGNOSIS — I119 Hypertensive heart disease without heart failure: Secondary | ICD-10-CM | POA: Diagnosis not present

## 2014-02-28 NOTE — Progress Notes (Signed)
Quick Note:  Please make copy of labs for patient visit. ______ 

## 2014-03-02 ENCOUNTER — Encounter: Payer: Self-pay | Admitting: Cardiology

## 2014-03-02 ENCOUNTER — Ambulatory Visit (INDEPENDENT_AMBULATORY_CARE_PROVIDER_SITE_OTHER): Payer: Medicare Other | Admitting: Cardiology

## 2014-03-02 VITALS — BP 126/79 | HR 63 | Ht 60.0 in | Wt 184.0 lb

## 2014-03-02 DIAGNOSIS — J309 Allergic rhinitis, unspecified: Secondary | ICD-10-CM

## 2014-03-02 DIAGNOSIS — E7800 Pure hypercholesterolemia, unspecified: Secondary | ICD-10-CM

## 2014-03-02 DIAGNOSIS — I119 Hypertensive heart disease without heart failure: Secondary | ICD-10-CM

## 2014-03-02 DIAGNOSIS — I259 Chronic ischemic heart disease, unspecified: Secondary | ICD-10-CM | POA: Diagnosis not present

## 2014-03-02 MED ORDER — NITROGLYCERIN 0.4 MG SL SUBL: 0.4000 mg | SUBLINGUAL_TABLET | SUBLINGUAL | Status: DC | PRN

## 2014-03-02 NOTE — Patient Instructions (Signed)
Your physician recommends that you continue on your current medications as directed. Please refer to the Current Medication list given to you today. [facility] [address]    Your physician wants you to follow-up in: 4 months with fasting labs (lp/bmet/hfp)   You will receive a reminder letter in the mail two months in advance. If you don't receive a letter, please call our office to schedule the follow-up appointment.   Your physician has requested that you have a lexiscan myoview. For further information please visit [email or web address]. Please follow instruction sheet, as given @ [phone]

## 2014-03-02 NOTE — Assessment & Plan Note (Signed)
The patient has had some chest discomfort.  She has shortness of breath.  Her heart tends to restart taking at night if she has to get up to go to the bathroom.  Her blood pressure tends to shoot up as well. She is very sedentary.  She travels by wheelchair.  She is concerned about her coronary arteries because of the previous known blockage which was checked years ago at [facility] and felt not to warrant intervention at that time.

## 2014-03-02 NOTE — Assessment & Plan Note (Signed)
The patient has a lot of difficulty breathing through her nose at night which contributes to her dyspnea.  She has been using Sudafed.  I told her is concerned about long-term use of Sudafed because of its problems with rebound stuffiness.  He would be better for her to try some saline nose drops and to use the Flonase which she has

## 2014-03-02 NOTE — Progress Notes (Signed)
Vanessa Nielsen Date of Birth:  Dec 20, 1933 [place] [address] [place], [place]  [address] [phone]        Fax   [phone]   History of Present Illness: This pleasant 80 year old woman is seen back for a four-month followup office visit. She has a past history of ischemic heart disease. She has a long history of hypertensive cardiovascular disease and hypercholesterolemia. She has had previous bypass graft surgery in [place]. Her last nuclear stress test was in 2012 which was preoperative prior to orthopedic surgery. She has multiple orthopedic issues. She has spina bifida and scoliosis. She also has had a lot of GI issues with severe constipation alternating with explosive diarrhea and fecal incontinence. She ambulates with difficulty and is in a wheelchair most of the time. When she does walk she uses a walker. She's had a lot of problems with nasal stuffiness and difficulty breathing at night. Since last visit she has continued to be involved with multiple systems disease. She feels that at the present time her gastrointestinal problems are her main problem. She has been seeing a gastroenterologist at [facility] but will be switching her care to Dr. Paulita Fujita. Since last visit she has seen her new PCP Dr. Ardeth Perfect and had a bone density test which showed osteopenia.  She has had a lot of problem with blood pressure and heart racing at night associated with some chest discomfort.  Her last Myoview stress test was in January 2012.   Current Outpatient Prescriptions  Medication Sig Dispense Refill  . AMITIZA 8 MCG capsule 1 tab daily occ      . amLODipine (NORVASC) 5 MG tablet Take 5 mg by mouth daily.      Marland Kitchen aspirin 81 MG EC tablet Take 81 mg by mouth daily.        Marland Kitchen atorvastatin (LIPITOR) 20 MG tablet Take 1 tablet (20 mg total) by mouth daily.  90 tablet  3  . diazepam (VALIUM) 5 MG tablet Take 1 tablet (5 mg total) by mouth daily as needed.   30 tablet  5  . docusate sodium (COLACE) 100 MG capsule Take 100 mg by mouth 2 (two) times daily.       Marland Kitchen esomeprazole (NEXIUM) 40 MG capsule Take 40 mg by mouth daily before breakfast.      . fluticasone (FLONASE) 50 MCG/ACT nasal spray USE 2 SPRAYS IN EACH NOSTRIL EVERY DAY  16 g  3  . lidocaine (LIDODERM) 5 % Place 1 patch onto the skin daily. Remove & Discard patch within 12 hours or as directed by MD      . Rolan Lipa 145 MCG CAPS capsule Take 145 mcg by mouth daily.       . Loperamide HCl (IMODIUM A-D PO) Take 1 tablet by mouth daily as needed (loose stool). As needed      . losartan-hydrochlorothiazide (HYZAAR) 100-25 MG per tablet Take 0.5 tablets by mouth daily. Taking 1/2 daily      . meclizine (ANTIVERT) 25 MG tablet TAKE 1 TABLET THREE TIMES A DAY AS NEEDED  90 tablet  2  . metoprolol (LOPRESSOR) 50 MG tablet Take 50 mg by mouth daily.      . nitroGLYCERIN (NITROSTAT) 0.4 MG SL tablet Place 1 tablet (0.4 mg total) under the tongue every 5 (five) minutes as needed.  25 tablet  3  . nitroGLYCERIN (NITROSTAT) 0.4 MG SL tablet Place 1 tablet (0.4 mg total) under the  tongue every 5 (five) minutes as needed.  25 tablet  3  . ondansetron (ZOFRAN) 8 MG tablet Take by mouth every 8 (eight) hours as needed.      . polyethylene glycol (MIRALAX / GLYCOLAX) packet Take 17 g by mouth 2 (two) times daily.      . potassium chloride (MICRO-K) 10 MEQ CR capsule TAKE ONE CAPSULE TWICE A DAY  180 capsule  0  . Probiotic Product (ALIGN PO) Take 1 tablet by mouth daily.       . promethazine (PHENERGAN) 25 MG tablet Take 1 tablet (25 mg total) by mouth every 6 (six) hours as needed.  30 tablet  1  . Pseudoephedrine-DM-GG (SUDAFED COUGH PO) Take by mouth as needed.      . simethicone (MYLICON) 80 MG chewable tablet Chew 80 mg by mouth every 6 (six) hours as needed. Gas x as needed       No current facility-administered medications for this visit.    Allergies  Allergen Reactions  . Cardura [Doxazosin  Mesylate]   . Clarithromycin   . Erythromycin   . Guaifenesin   . Ketorolac Tromethamine     Patient Active Problem List   Diagnosis Date Noted  . Hypokalemia 12/21/2013  . Emesis, persistent 12/21/2013  . Cushing syndrome   . Allergic rhinitis   . Ischemic heart disease 02/25/2011  . Benign hypertensive heart disease without heart failure 02/25/2011  . Hypercholesterolemia 02/25/2011  . Spina bifida of lumbar spine 02/25/2011  . Irritable bowel syndrome (IBS) 02/25/2011    History  Smoking status  . Never Smoker   Smokeless tobacco  . Not on file    Comment: married, lives with spouse. 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 Cancer Father 74    colon  . Arthritis Father   . Arthritis Mother   . Heart disease Other   . Hypertension Other     Review of Systems: Constitutional: no fever chills diaphoresis or fatigue or change in weight.  Head and neck: no hearing loss, no epistaxis, no photophobia or visual disturbance. Respiratory: No cough, shortness of breath or wheezing. Cardiovascular: No chest pain peripheral edema, palpitations. Gastrointestinal: No abdominal distention, no abdominal pain, no change in bowel habits hematochezia or melena. Genitourinary: No dysuria, no frequency, no urgency, no nocturia. Musculoskeletal:No arthralgias, no back pain, no gait disturbance or myalgias. Neurological: No dizziness, no headaches, no numbness, no seizures, no syncope, no weakness, no tremors. Hematologic: No lymphadenopathy, no easy bruising. Psychiatric: No confusion, no hallucinations, no sleep disturbance.    Physical Exam: Filed Vitals:   03/02/14 1453  BP: 126/79  Pulse: 63   the general appearance is that of a elderly female who is in a wheelchair.The head and neck exam reveals pupils equal and reactive.  Extraocular movements are full.  There is no  scleral icterus.  The mouth and pharynx are normal.  The neck is supple.  The carotids reveal no bruits.  The jugular venous pressure is normal.  The  thyroid is not enlarged.  There is no lymphadenopathy.  The chest is clear to percussion and auscultation.  There are no rales or rhonchi.  Expansion of the chest is symmetrical.  The precordium is quiet.  The first heart sound is normal.  The second heart sound is physiologically split.  There is no murmur gallop rub or  click.  There is no abnormal lift or heave.  The abdomen is soft and nontender.  The bowel sounds are normal.  The liver and spleen are not enlarged.  There are no abdominal masses.  There are no abdominal bruits.  Extremities reveal good pedal pulses.  There is no phlebitis or edema.  There is no cyanosis or clubbing.  Strength is normal and symmetrical in all extremities.  There is no lateralizing weakness.  There are no sensory deficits.  The skin is warm and dry.  There is no rash.  She has significant kyphoscoliosis.  EKG shows normal sinus rhythm with nonspecific T-wave changes      Assessment / Plan: 1. ischemic heart disease 2. hypertensive heart disease without heart failure 3. spina bifida and scoliosis 4. Hypercholesterolemia 5. irritable bowel syndrome 6. osteopenia  Plan: Continue same medication.  Reviewed her labs which are satisfactory.  We refilled her sublingual nitroglycerin.  She will return for a Lexi scan Myoview stress test. Recheck in 4 months for office visit lipid panel basal metabolic panel and hepatic function panel

## 2014-03-02 NOTE — Assessment & Plan Note (Signed)
We reviewed her lab work which is satisfactory for her.  LDL only slightly high at 115 with LDL at 64.  Continue to work closely with diet and continue same dose of Lipitor 20 mg daily

## 2014-03-07 ENCOUNTER — Other Ambulatory Visit: Payer: Self-pay | Admitting: Dermatology

## 2014-03-07 DIAGNOSIS — D485 Neoplasm of uncertain behavior of skin: Secondary | ICD-10-CM | POA: Diagnosis not present

## 2014-03-07 DIAGNOSIS — C44711 Basal cell carcinoma of skin of unspecified lower limb, including hip: Secondary | ICD-10-CM | POA: Diagnosis not present

## 2014-03-07 DIAGNOSIS — Z85828 Personal history of other malignant neoplasm of skin: Secondary | ICD-10-CM | POA: Diagnosis not present

## 2014-03-08 DIAGNOSIS — R195 Other fecal abnormalities: Secondary | ICD-10-CM | POA: Diagnosis not present

## 2014-03-14 ENCOUNTER — Other Ambulatory Visit: Payer: Self-pay | Admitting: Cardiology

## 2014-03-14 DIAGNOSIS — F419 Anxiety disorder, unspecified: Secondary | ICD-10-CM

## 2014-03-14 MED ORDER — DIAZEPAM 5 MG PO TABS: 5.0000 mg | ORAL_TABLET | Freq: Every day | ORAL | Status: DC | PRN

## 2014-03-14 NOTE — Telephone Encounter (Signed)
[facility] and advised [facility]

## 2014-03-14 NOTE — Telephone Encounter (Signed)
Okay to refill her diazepam until she can see her PCP at the end of July

## 2014-03-14 NOTE — Telephone Encounter (Signed)
New Message:  Pt states she would like to speak to [place]. Pt does not wish to leave a msg.

## 2014-03-14 NOTE — Telephone Encounter (Signed)
Patient called requesting a refill on her Diazepam. She was scheduled to see her PCP in June but office called and rescheduled. Has ov end of July. Will forward to  Dr. Mare Ferrari for review

## 2014-03-20 ENCOUNTER — Encounter (HOSPITAL_COMMUNITY): Payer: Medicare Other

## 2014-03-23 DIAGNOSIS — K1120 Sialoadenitis, unspecified: Secondary | ICD-10-CM | POA: Diagnosis not present

## 2014-03-28 DIAGNOSIS — N390 Urinary tract infection, site not specified: Secondary | ICD-10-CM | POA: Diagnosis not present

## 2014-04-03 DIAGNOSIS — I10 Essential (primary) hypertension: Secondary | ICD-10-CM | POA: Diagnosis not present

## 2014-04-03 DIAGNOSIS — E785 Hyperlipidemia, unspecified: Secondary | ICD-10-CM | POA: Diagnosis not present

## 2014-04-03 DIAGNOSIS — M949 Disorder of cartilage, unspecified: Secondary | ICD-10-CM | POA: Diagnosis not present

## 2014-04-03 DIAGNOSIS — I2510 Atherosclerotic heart disease of native coronary artery without angina pectoris: Secondary | ICD-10-CM | POA: Diagnosis not present

## 2014-04-03 DIAGNOSIS — M899 Disorder of bone, unspecified: Secondary | ICD-10-CM | POA: Diagnosis not present

## 2014-04-06 DIAGNOSIS — M4120 Other idiopathic scoliosis, site unspecified: Secondary | ICD-10-CM | POA: Diagnosis not present

## 2014-04-06 DIAGNOSIS — M47817 Spondylosis without myelopathy or radiculopathy, lumbosacral region: Secondary | ICD-10-CM | POA: Diagnosis not present

## 2014-04-06 DIAGNOSIS — Q062 Diastematomyelia: Secondary | ICD-10-CM | POA: Diagnosis not present

## 2014-04-12 DIAGNOSIS — Z0000 Encounter for general adult medical examination without abnormal findings: Secondary | ICD-10-CM | POA: Diagnosis not present

## 2014-04-12 DIAGNOSIS — E785 Hyperlipidemia, unspecified: Secondary | ICD-10-CM | POA: Diagnosis not present

## 2014-04-12 DIAGNOSIS — I10 Essential (primary) hypertension: Secondary | ICD-10-CM | POA: Diagnosis not present

## 2014-04-12 DIAGNOSIS — F411 Generalized anxiety disorder: Secondary | ICD-10-CM | POA: Diagnosis not present

## 2014-04-12 DIAGNOSIS — Z1331 Encounter for screening for depression: Secondary | ICD-10-CM | POA: Diagnosis not present

## 2014-04-12 DIAGNOSIS — Z1212 Encounter for screening for malignant neoplasm of rectum: Secondary | ICD-10-CM | POA: Diagnosis not present

## 2014-04-12 DIAGNOSIS — M949 Disorder of cartilage, unspecified: Secondary | ICD-10-CM | POA: Diagnosis not present

## 2014-04-12 DIAGNOSIS — Z85828 Personal history of other malignant neoplasm of skin: Secondary | ICD-10-CM | POA: Diagnosis not present

## 2014-04-12 DIAGNOSIS — E559 Vitamin D deficiency, unspecified: Secondary | ICD-10-CM | POA: Diagnosis not present

## 2014-04-12 DIAGNOSIS — M899 Disorder of bone, unspecified: Secondary | ICD-10-CM | POA: Diagnosis not present

## 2014-04-26 ENCOUNTER — Other Ambulatory Visit: Payer: Self-pay | Admitting: Cardiology

## 2014-05-05 ENCOUNTER — Telehealth: Payer: Self-pay

## 2014-05-05 ENCOUNTER — Other Ambulatory Visit: Payer: Self-pay | Admitting: Cardiology

## 2014-05-06 NOTE — Telephone Encounter (Signed)
What medicine needs refill?

## 2014-05-08 NOTE — Telephone Encounter (Signed)
I am sorry I did not put the medicines name but it is Flonase Thanks

## 2014-05-08 NOTE — Telephone Encounter (Signed)
Okay to refill? 

## 2014-05-15 ENCOUNTER — Other Ambulatory Visit: Payer: Self-pay | Admitting: Cardiology

## 2014-05-29 DIAGNOSIS — M4120 Other idiopathic scoliosis, site unspecified: Secondary | ICD-10-CM | POA: Diagnosis not present

## 2014-05-29 DIAGNOSIS — M6281 Muscle weakness (generalized): Secondary | ICD-10-CM | POA: Diagnosis not present

## 2014-05-29 DIAGNOSIS — R279 Unspecified lack of coordination: Secondary | ICD-10-CM | POA: Diagnosis not present

## 2014-05-29 DIAGNOSIS — R195 Other fecal abnormalities: Secondary | ICD-10-CM | POA: Diagnosis not present

## 2014-05-31 ENCOUNTER — Other Ambulatory Visit: Payer: Self-pay | Admitting: Cardiology

## 2014-05-31 DIAGNOSIS — K5900 Constipation, unspecified: Secondary | ICD-10-CM | POA: Diagnosis not present

## 2014-06-12 DIAGNOSIS — M62838 Other muscle spasm: Secondary | ICD-10-CM | POA: Diagnosis not present

## 2014-06-12 DIAGNOSIS — R195 Other fecal abnormalities: Secondary | ICD-10-CM | POA: Diagnosis not present

## 2014-06-12 DIAGNOSIS — R279 Unspecified lack of coordination: Secondary | ICD-10-CM | POA: Diagnosis not present

## 2014-06-12 DIAGNOSIS — M6281 Muscle weakness (generalized): Secondary | ICD-10-CM | POA: Diagnosis not present

## 2014-06-15 DIAGNOSIS — Z23 Encounter for immunization: Secondary | ICD-10-CM | POA: Diagnosis not present

## 2014-06-21 ENCOUNTER — Other Ambulatory Visit: Payer: Self-pay

## 2014-06-21 DIAGNOSIS — Z1231 Encounter for screening mammogram for malignant neoplasm of breast: Secondary | ICD-10-CM

## 2014-06-26 ENCOUNTER — Telehealth: Payer: Self-pay | Admitting: Cardiology

## 2014-06-26 NOTE — Telephone Encounter (Signed)
pts husband calling regarding elevated blood pressure of 150s/80s, pulse in the 80s. Took extra lopressor and bp in the 140s, pulse of 85. Reassured husband. Recommended against further prn metoprolol or other BP agents. FYI to Dr. Mare Ferrari.

## 2014-07-03 ENCOUNTER — Ambulatory Visit (INDEPENDENT_AMBULATORY_CARE_PROVIDER_SITE_OTHER): Payer: Medicare Other | Admitting: Cardiology

## 2014-07-03 ENCOUNTER — Encounter: Payer: Self-pay | Admitting: Cardiology

## 2014-07-03 VITALS — BP 130/68 | HR 65 | Ht 59.0 in | Wt 183.0 lb

## 2014-07-03 DIAGNOSIS — I119 Hypertensive heart disease without heart failure: Secondary | ICD-10-CM

## 2014-07-03 DIAGNOSIS — E7800 Pure hypercholesterolemia, unspecified: Secondary | ICD-10-CM

## 2014-07-03 DIAGNOSIS — I259 Chronic ischemic heart disease, unspecified: Secondary | ICD-10-CM | POA: Diagnosis not present

## 2014-07-03 DIAGNOSIS — K589 Irritable bowel syndrome without diarrhea: Secondary | ICD-10-CM | POA: Diagnosis not present

## 2014-07-03 MED ORDER — ATORVASTATIN CALCIUM 40 MG PO TABS: 40.0000 mg | ORAL_TABLET | Freq: Every day | ORAL | Status: DC

## 2014-07-03 NOTE — Assessment & Plan Note (Signed)
She has known ischemic heart disease with prior PCI at [facility].  No recent increase in cardiac symptoms.

## 2014-07-03 NOTE — Progress Notes (Signed)
Neill Loft Date of Birth:  25-Apr-1934 [place] [address] [place], [place]  [address] [phone]        Fax   [phone]   History of Present Illness: This pleasant 80 year old woman is seen back for a four-month followup office visit. She has a past history of ischemic heart disease. She has a long history of hypertensive cardiovascular disease and hypercholesterolemia. She has had previous bypass graft surgery in [place]. Her last nuclear stress test was in 2012 which was preoperative prior to orthopedic surgery. She has multiple orthopedic issues. She has spina bifida and scoliosis. She also has had a lot of GI issues with severe constipation alternating with explosive diarrhea and fecal incontinence. She ambulates with difficulty and is in a wheelchair most of the time. When she does walk she uses a walker. She's had a lot of problems with nasal stuffiness and difficulty breathing at night. Since last visit she has continued to be involved with multiple systems disease. She feels that at the present time her gastrointestinal problems are her main problem. She has been seeing a gastroenterologist at [facility] but will be switching her care to Dr. Paulita Fujita. Since last visit she has seen her new PCP Dr. Ardeth Perfect and had a bone density test which showed osteopenia.  She has had a lot of problem with blood pressure and heart racing at night associated with some chest discomfort.  Her last Myoview stress test was in January 2012.  At her last visit we talked about scheduling another Myoview but she was sick on the scheduled date and has not rescheduled.  She is not having any active chest discomfort. Her main problems continue to be gastrointestinal.  She stays intensely nauseated all day worse in the morning and feels best in the evening.  She has problems with constipation.  She takes Lynzess on an intermittent basis.  Current Outpatient  Prescriptions  Medication Sig Dispense Refill  . AMITIZA 8 MCG capsule 1 tab daily occ      . amLODipine (NORVASC) 5 MG tablet TAKE 1 TABLET (5 MG TOTAL) BY MOUTH 2 (TWO) TIMES DAILY.  180 tablet  1  . aspirin 81 MG EC tablet Take 81 mg by mouth daily.        Marland Kitchen atorvastatin (LIPITOR) 40 MG tablet Take 1 tablet (40 mg total) by mouth daily.  90 tablet  3  . diazepam (VALIUM) 5 MG tablet Take 1 tablet (5 mg total) by mouth daily as needed.  30 tablet  0  . docusate sodium (COLACE) 100 MG capsule Take 100 mg by mouth 2 (two) times daily.       Marland Kitchen esomeprazole (NEXIUM) 40 MG capsule Take 40 mg by mouth daily before breakfast.      . fluticasone (FLONASE) 50 MCG/ACT nasal spray USE 2 SPRAYS IN EACH NOSTRIL EVERY DAY  16 g  3  . lidocaine (LIDODERM) 5 % Place 1 patch onto the skin daily. Remove & Discard patch within 12 hours or as directed by MD      . Rolan Lipa 145 MCG CAPS capsule Take 145 mcg by mouth daily.       . Loperamide HCl (IMODIUM A-D PO) Take 1 tablet by mouth daily as needed (loose stool). As needed      . losartan-hydrochlorothiazide (HYZAAR) 100-25 MG per tablet Take 0.5 tablets by mouth daily. Taking 1/2 daily      . meclizine (ANTIVERT) 25 MG  tablet TAKE 1 TABLET THREE TIMES A DAY AS NEEDED  90 tablet  2  . metoprolol (LOPRESSOR) 50 MG tablet Take 50 mg by mouth daily.      . metoprolol (LOPRESSOR) 50 MG tablet Take 1 tablet (50 mg total) by mouth daily.  90 tablet  0  . nitroGLYCERIN (NITROSTAT) 0.4 MG SL tablet Place 1 tablet (0.4 mg total) under the tongue every 5 (five) minutes as needed.  25 tablet  3  . ondansetron (ZOFRAN) 8 MG tablet Take by mouth every 8 (eight) hours as needed.      . polyethylene glycol (MIRALAX / GLYCOLAX) packet Take 17 g by mouth 2 (two) times daily.      . potassium chloride (MICRO-K) 10 MEQ CR capsule TAKE ONE CAPSULE BY MOUTH TWICE A DAY  180 capsule  0  . Probiotic Product (ALIGN PO) Take 1 tablet by mouth daily.       . promethazine (PHENERGAN) 25  MG tablet Take 1 tablet (25 mg total) by mouth every 6 (six) hours as needed.  30 tablet  1  . Pseudoephedrine-DM-GG (SUDAFED COUGH PO) Take by mouth as needed.      . simethicone (MYLICON) 80 MG chewable tablet Chew 80 mg by mouth every 6 (six) hours as needed. Gas x as needed       No current facility-administered medications for this visit.    Allergies  Allergen Reactions  . Cardura [Doxazosin Mesylate]   . Clarithromycin   . Erythromycin   . Guaifenesin   . Ketorolac Tromethamine     Patient Active Problem List   Diagnosis Date Noted  . Hypokalemia 12/21/2013  . Emesis, persistent 12/21/2013  . Cushing syndrome   . Allergic rhinitis   . Ischemic heart disease 02/25/2011  . Benign hypertensive heart disease without heart failure 02/25/2011  . Hypercholesterolemia 02/25/2011  . Spina bifida of lumbar spine 02/25/2011  . Irritable bowel syndrome (IBS) 02/25/2011    History  Smoking status  . Never Smoker   Smokeless tobacco  . Not on file    Comment: married, lives with spouse. 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 Cancer Father 35    colon  . Arthritis Father   . Arthritis Mother   . Heart disease Other   . Hypertension Other     Review of Systems: Constitutional: no fever chills diaphoresis or fatigue or change in weight.  Head and neck: no hearing loss, no epistaxis, no photophobia or visual disturbance. Respiratory: No cough, shortness of breath or wheezing. Cardiovascular: No chest pain peripheral edema, palpitations. Gastrointestinal: No abdominal distention, no abdominal pain, no change in bowel habits hematochezia or melena. Genitourinary: No dysuria, no frequency, no urgency, no nocturia. Musculoskeletal:No arthralgias, no back pain, no gait disturbance or myalgias. Neurological: No dizziness, no headaches, no numbness, no seizures, no  syncope, no weakness, no tremors. Hematologic: No lymphadenopathy, no easy bruising. Psychiatric: No confusion, no hallucinations, no sleep disturbance.    Physical Exam: Filed Vitals:   07/03/14 1612  BP: 130/68  Pulse: 65   the general appearance is that of a elderly female who is in a wheelchair.The head and neck exam reveals pupils equal and reactive.  Extraocular movements are full.  There is no scleral icterus.  The mouth and pharynx are normal.  The neck is supple.  The carotids reveal no  bruits.  The jugular venous pressure is normal.  The  thyroid is not enlarged.  There is no lymphadenopathy.  The chest is clear to percussion and auscultation.  There are no rales or rhonchi.  Expansion of the chest is symmetrical.  The precordium is quiet.  The first heart sound is normal.  The second heart sound is physiologically split.  There is no murmur gallop rub or click.  There is no abnormal lift or heave.  The abdomen is soft and nontender.  The bowel sounds are normal.  The liver and spleen are not enlarged.  There are no abdominal masses.  There are no abdominal bruits.  Extremities reveal good pedal pulses.  There is no phlebitis or edema.  There is no cyanosis or clubbing.  Strength is normal and symmetrical in all extremities.  There is no lateralizing weakness.  There are no sensory deficits.  The skin is warm and dry.  There is no rash.  She has significant kyphoscoliosis.  EKG shows normal sinus rhythm with nonspecific T-wave changes      Assessment / Plan: 1. ischemic heart disease 2. hypertensive heart disease without heart failure 3. spina bifida and scoliosis 4. Hypercholesterolemia 5. irritable bowel syndrome 6. osteopenia  Plan: Continue same medication except increase Lipitor to 40 mg daily   Recheck in 4 months for office visit lipid panel basal metabolic panel and hepatic function panel.

## 2014-07-03 NOTE — Assessment & Plan Note (Signed)
Blood pressure is reasonably stable on current therapy.  If she fails to take her bedtime metoprolol she will experience forceful palpitations at night when she gets up to go to the bathroom.

## 2014-07-03 NOTE — Assessment & Plan Note (Signed)
She is now seeing Dr.Outlaw regarding her GI problems.  She is considering having a colostomy.  This was her idea to gain control of her bowel movements.

## 2014-07-03 NOTE — Assessment & Plan Note (Signed)
She is not yet at target in regard to her LDL levels.  She will increase her Lipitor from 20 mg to 40 mg daily.  If she begins to have muscle weakness or myalgias, she will need to cut back.

## 2014-07-03 NOTE — Patient Instructions (Signed)
INCREASE YOUR LIPITOR TO 40 MG DAILY  Your physician wants you to follow-up in: 4 months with fasting labs (lp/bmet/hfp) You will receive a reminder letter in the mail two months in advance. If you don't receive a letter, please call our office to schedule the follow-up appointment.

## 2014-07-07 DIAGNOSIS — R8299 Other abnormal findings in urine: Secondary | ICD-10-CM | POA: Diagnosis not present

## 2014-07-07 DIAGNOSIS — R300 Dysuria: Secondary | ICD-10-CM | POA: Diagnosis not present

## 2014-07-12 ENCOUNTER — Ambulatory Visit: Payer: Medicare Other

## 2014-07-21 DIAGNOSIS — Z86010 Personal history of colonic polyps: Secondary | ICD-10-CM | POA: Diagnosis not present

## 2014-07-21 DIAGNOSIS — R110 Nausea: Secondary | ICD-10-CM | POA: Diagnosis not present

## 2014-07-21 DIAGNOSIS — R194 Change in bowel habit: Secondary | ICD-10-CM | POA: Diagnosis not present

## 2014-07-27 ENCOUNTER — Ambulatory Visit
Admission: RE | Admit: 2014-07-27 | Discharge: 2014-07-27 | Disposition: A | Discharge status: Home / Self Care | Payer: Medicare Other | Source: Ambulatory Visit

## 2014-07-27 DIAGNOSIS — Z1231 Encounter for screening mammogram for malignant neoplasm of breast: Secondary | ICD-10-CM | POA: Diagnosis not present

## 2014-08-07 ENCOUNTER — Other Ambulatory Visit: Payer: Self-pay | Admitting: Cardiology

## 2014-08-07 DIAGNOSIS — R0600 Dyspnea, unspecified: Secondary | ICD-10-CM

## 2014-08-21 DIAGNOSIS — M19011 Primary osteoarthritis, right shoulder: Secondary | ICD-10-CM | POA: Diagnosis not present

## 2014-08-25 ENCOUNTER — Other Ambulatory Visit: Payer: Self-pay | Admitting: Cardiology

## 2014-09-10 ENCOUNTER — Other Ambulatory Visit: Payer: Self-pay | Admitting: Cardiology

## 2014-09-10 DIAGNOSIS — R42 Dizziness and giddiness: Secondary | ICD-10-CM

## 2014-09-11 ENCOUNTER — Other Ambulatory Visit: Payer: Self-pay | Admitting: *Deleted

## 2014-09-11 MED ORDER — MECLIZINE HCL 25 MG PO TABS: 25.0000 mg | ORAL_TABLET | Freq: Three times a day (TID) | ORAL | Status: DC | PRN

## 2014-09-11 MED ORDER — METOPROLOL TARTRATE 50 MG PO TABS: 50.0000 mg | ORAL_TABLET | Freq: Every day | ORAL | Status: DC

## 2014-09-15 ENCOUNTER — Other Ambulatory Visit: Payer: Self-pay | Admitting: Cardiology

## 2014-09-18 ENCOUNTER — Telehealth: Payer: Self-pay | Admitting: Cardiology

## 2014-09-18 NOTE — Telephone Encounter (Signed)
Have her try splitting her dose of Metoprolol to half tab in morning and half tab in evening and see if early morning BPs improve.

## 2014-09-18 NOTE — Telephone Encounter (Signed)
Returned patient's call about her elevated blood pressure in the mornings. Patient blood pressure when waking up yesterday was 180/93 and heart rate 97, after morning medications, blood pressure was 154/73 and heart rate 65, and later in the day blood pressure was 117/89 and heart rate 59. Today patient woke up with BP 164/89 and HR 79, and after morning medications BP124/63 and HR 59. Patient complained of having a racing heart and some chest discomfort in the mornings. Patient's husband has been in the hospital over the holidays and is finally home. Patient seems to be stressed. Patient's husband usually is the one to take care of the patient, but right now it's her daughter. Patient states, "I am fine right now, but I am worried about my blood pressure being so high in the mornings." Informed patient that this message will be forward to Dr. Mare Ferrari for further evaluation.

## 2014-09-18 NOTE — Telephone Encounter (Signed)
Called patient back with Dr. Sherryl Barters instructions to try splitting her dose of Metoprolol to half tab in morning and half tab in evening and see if early morning BP's improve. Patient verbalized understanding and agreeable to plan. Encouraged patient to call with any other concerns.

## 2014-09-18 NOTE — Telephone Encounter (Signed)
Pt c/o BP issue: STAT if pt c/o blurred vision, one-sided weakness or slurred speech  1. What are your last 5 BP readings? Most recent this morning: 164/89 with HR of 79 this morning. Pt did not record other high readings.  2. Are you having any other symptoms (ex. Dizziness, headache, blurred vision, passed out)? No associated symptoms but has dizziness at baseline.  3. What is your BP issue? Pt states BP has been elevated x 3 days. She has taken 1 dose of Lopressor after each high reading.

## 2014-09-20 ENCOUNTER — Telehealth: Payer: Self-pay | Admitting: *Deleted

## 2014-09-20 NOTE — Telephone Encounter (Signed)
Spoke with patient about her blood pressure and heart rate Patient stated her heart rate has been elevated and blood pressure lower (114/66 hr 91) Patient stated that she does not take her Norvasc, Losartan, or Lopressor on a regular basis and that it all depends on blood pressure readings Patient stated she gets up in the middle of the night and has to use the bedside commode with assistance and feels her heart rate elevated and pounding when gets back in the bed At this time whoever is assisting her will check her blood pressure and hear rate will be up.  Did ask patient if she allows time for her heart rate to get back to normal after exerting herself and she does not Discussed with  Dr. Mare Ferrari and will have patient start taking a more regular regimen of Lopressor 50 mg 1/2 tablet twice a day If systolic blood pressure above 150 ok to take a Norvasc 5 mg tablet If heart rate about 95 ok to [place] Valium 1/4-1/2 tablet  Advised patient, agreeable to plan Continue to monitor and call back if no better

## 2014-09-26 ENCOUNTER — Other Ambulatory Visit: Payer: Self-pay | Admitting: Cardiology

## 2014-09-27 ENCOUNTER — Other Ambulatory Visit: Payer: Self-pay | Admitting: Cardiology

## 2014-10-05 ENCOUNTER — Other Ambulatory Visit: Payer: Self-pay | Admitting: Cardiology

## 2014-10-23 DIAGNOSIS — Z86010 Personal history of colonic polyps: Secondary | ICD-10-CM | POA: Diagnosis not present

## 2014-10-23 DIAGNOSIS — R197 Diarrhea, unspecified: Secondary | ICD-10-CM | POA: Diagnosis not present

## 2014-10-23 DIAGNOSIS — R110 Nausea: Secondary | ICD-10-CM | POA: Diagnosis not present

## 2014-10-30 ENCOUNTER — Other Ambulatory Visit: Payer: Self-pay | Admitting: Cardiology

## 2014-11-02 ENCOUNTER — Other Ambulatory Visit: Payer: Self-pay | Admitting: Gastroenterology

## 2014-11-02 DIAGNOSIS — K222 Esophageal obstruction: Secondary | ICD-10-CM | POA: Diagnosis not present

## 2014-11-02 DIAGNOSIS — R1084 Generalized abdominal pain: Secondary | ICD-10-CM | POA: Diagnosis not present

## 2014-11-02 DIAGNOSIS — K449 Diaphragmatic hernia without obstruction or gangrene: Secondary | ICD-10-CM | POA: Diagnosis not present

## 2014-11-02 DIAGNOSIS — R110 Nausea: Secondary | ICD-10-CM | POA: Diagnosis not present

## 2014-11-02 DIAGNOSIS — K6389 Other specified diseases of intestine: Secondary | ICD-10-CM | POA: Diagnosis not present

## 2014-11-02 DIAGNOSIS — R109 Unspecified abdominal pain: Secondary | ICD-10-CM | POA: Diagnosis not present

## 2014-11-02 DIAGNOSIS — K5730 Diverticulosis of large intestine without perforation or abscess without bleeding: Secondary | ICD-10-CM | POA: Diagnosis not present

## 2014-11-02 DIAGNOSIS — D120 Benign neoplasm of cecum: Secondary | ICD-10-CM | POA: Diagnosis not present

## 2014-11-02 DIAGNOSIS — K644 Residual hemorrhoidal skin tags: Secondary | ICD-10-CM | POA: Diagnosis not present

## 2014-11-02 DIAGNOSIS — D126 Benign neoplasm of colon, unspecified: Secondary | ICD-10-CM | POA: Diagnosis not present

## 2014-11-06 ENCOUNTER — Ambulatory Visit: Payer: Medicare Other | Admitting: Cardiology

## 2014-11-06 ENCOUNTER — Other Ambulatory Visit (INDEPENDENT_AMBULATORY_CARE_PROVIDER_SITE_OTHER): Payer: Medicare Other | Admitting: *Deleted

## 2014-11-06 DIAGNOSIS — E7800 Pure hypercholesterolemia, unspecified: Secondary | ICD-10-CM

## 2014-11-06 DIAGNOSIS — I119 Hypertensive heart disease without heart failure: Secondary | ICD-10-CM

## 2014-11-06 LAB — LIPID PANEL
Cholesterol: 196 mg/dL (ref 0–200)
HDL: 50.8 mg/dL (ref >39.00)
LDL Cholesterol: 114 mg/dL — ABNORMAL HIGH (ref 0–99)
NONHDL: 145.2
Total CHOL/HDL Ratio: 4
Triglycerides: 155 mg/dL — ABNORMAL HIGH (ref 0.0–149.0)
VLDL: 31 mg/dL (ref 0.0–40.0)

## 2014-11-06 LAB — BASIC METABOLIC PANEL
BUN: 10 mg/dL (ref 6–23)
CALCIUM: 9.3 mg/dL (ref 8.4–10.5)
CO2: 30 mEq/L (ref 19–32)
Chloride: 96 mEq/L (ref 96–112)
Creatinine, Ser: 0.68 mg/dL (ref 0.40–1.20)
GFR: 88.88 mL/min (ref >60.00)
Glucose, Bld: 101 mg/dL — ABNORMAL HIGH (ref 70–99)
POTASSIUM: 3.9 meq/L (ref 3.5–5.1)
SODIUM: 134 meq/L — AB (ref 135–145)

## 2014-11-06 LAB — HEPATIC FUNCTION PANEL
ALBUMIN: 3.5 g/dL (ref 3.5–5.2)
ALT: 10 U/L (ref 0–35)
AST: 14 U/L (ref 0–37)
Alkaline Phosphatase: 88 U/L (ref 39–117)
BILIRUBIN DIRECT: 0.1 mg/dL (ref 0.0–0.3)
BILIRUBIN TOTAL: 0.5 mg/dL (ref 0.2–1.2)
Total Protein: 6.7 g/dL (ref 6.0–8.3)

## 2014-11-07 ENCOUNTER — Other Ambulatory Visit: Payer: Medicare Other

## 2014-11-07 NOTE — Progress Notes (Signed)
Quick Note:  Please make copy of labs for patient visit. ______ 

## 2014-11-08 ENCOUNTER — Ambulatory Visit (INDEPENDENT_AMBULATORY_CARE_PROVIDER_SITE_OTHER): Payer: Medicare Other | Admitting: Cardiology

## 2014-11-08 ENCOUNTER — Encounter: Payer: Self-pay | Admitting: Cardiology

## 2014-11-08 VITALS — BP 138/76 | HR 64 | Ht 59.0 in | Wt 185.0 lb

## 2014-11-08 DIAGNOSIS — I119 Hypertensive heart disease without heart failure: Secondary | ICD-10-CM

## 2014-11-08 DIAGNOSIS — E7800 Pure hypercholesterolemia, unspecified: Secondary | ICD-10-CM

## 2014-11-08 DIAGNOSIS — I259 Chronic ischemic heart disease, unspecified: Secondary | ICD-10-CM | POA: Diagnosis not present

## 2014-11-08 NOTE — Progress Notes (Signed)
Cardiology Office Note   Date:  11/08/2014   ID:  Vanessa Nielsen, DOB [birth year], MRN [id number]  PCP:  Velna Hatchet, MD  Cardiologist:   Darlin Coco, MD   No chief complaint on file.     History of Present Illness: Vanessa Nielsen is a 81 y.o. female who presents for a four-month follow-up office visit.  This pleasant 81 year old woman is seen back for a four-month followup office visit. She has a past history of ischemic heart disease. She has a long history of hypertensive cardiovascular disease and hypercholesterolemia. She has had previous bypass graft surgery at [facility] in [place] in about 2008.  Her last nuclear stress test was in 2012 which was preoperative prior to orthopedic surgery. She has multiple orthopedic issues. She has spina bifida and scoliosis. She also has had a lot of GI issues with severe constipation alternating with explosive diarrhea and fecal incontinence. She ambulates with difficulty and is in a wheelchair most of the time. When she does walk she uses a walker. She's had a lot of problems with nasal stuffiness and difficulty breathing at night. Since last visit she has continued to be involved with multiple systems disease. She feels that at the present time her gastrointestinal problems are her main problem. She had been seeing a gastroenterologist at [facility] but has switched her care to Dr. Paulita Fujita. Since last visit she has seen her new PCP Dr. Ardeth Perfect and had a bone density test which showed osteopenia. She has had a lot of problem with blood pressure and heart racing at night associated with some chest discomfort. Her last Myoview stress test was in January 2012. At her last visit we talked about scheduling another Myoview but she was sick on the scheduled date and has not rescheduled.  She now is asking to be scheduled. She is not having any active chest discomfort at this time.  Does have exertional  dyspnea. Since we last saw her, her husband had a heart attack on Christmas Eve and is no longer able to help her get from the bed to the porta potty next to the bed.  They have hired an Engineer, production  who stays at the home from 11 PM until 9 AM to help the patient. Her main problems continue to be gastrointestinal. She stays intensely nauseated all day worse in the morning and feels best in the evening. She has problems with constipation. She takes Lynzess on an intermittent basis.  Past Medical History  Diagnosis Date  . Hyperlipidemia   . Hypertension   . Coronary artery disease     2v CABG @ [facility]  . Scoliosis   . Spina bifida   . Osteoarthritis   . Iatrogenic Cushing's disease   . Hypercholesteremia   . Allergic rhinitis   . Multiple allergies     induced by pain: swelling in UE/face/torso, nausea, hives followed by hypotension unless tx with high dose steroids    Past Surgical History  Procedure Laterality Date  . Cardiac catheterization  11/2006  . Total knee arthroplasty  09/2010    right  . Cholecystectomy, laparoscopic  05/27/2010  . Coronary angioplasty  11/2006    left anterior descending vessel  . Cardiovascular stress test  09/19/2010  . Cholecystectomy  2011  . Appendectomy  1960  . Tonsillectomy  1943  . Abdominal hysterectomy  1981 or 82  . Surgery for scoliosis  1995  . Hardware removed in spine due to  loos screws  1997  . Anal rectal manometry N/A 02/13/2014    Procedure: ANAL RECTAL MANOMETRY;  Surgeon: Arta Silence, MD;  Location: [facility];  Service: Endoscopy;  Laterality: N/A;     Current Outpatient Prescriptions  Medication Sig Dispense Refill  . AMITIZA 8 MCG capsule 1 tab daily occ    . amLODipine (NORVASC) 5 MG tablet Take 5 mg by mouth as directed. Take 1 tablet by mouth if systolic blood pressure above 150    . aspirin 81 MG EC tablet Take 81 mg by mouth daily.      Marland Kitchen atorvastatin (LIPITOR) 40 MG tablet Take 1 tablet (40 mg total) by mouth daily. 90  tablet 3  . diazepam (VALIUM) 5 MG tablet Take 1 tablet (5 mg total) by mouth daily as needed. 30 tablet 0  . docusate sodium (COLACE) 100 MG capsule Take 100 mg by mouth 2 (two) times daily.     Marland Kitchen esomeprazole (NEXIUM) 40 MG capsule Take 40 mg by mouth at bedtime.     . fluticasone (FLONASE) 50 MCG/ACT nasal spray USE 2 SPRAYS IN EACH NOSTRIL EVERY DAY 0.5 g 3  . lidocaine (LIDODERM) 5 % Place 1 patch onto the skin daily. Remove & Discard patch within 12 hours or as directed by MD    . Rolan Lipa 145 MCG CAPS capsule Take 145 mcg by mouth daily.     . Loperamide HCl (IMODIUM A-D PO) Take 1 tablet by mouth daily as needed (loose stool). As needed    . losartan-hydrochlorothiazide (HYZAAR) 100-25 MG per tablet TAKE 1 TABLET EVERY DAY 90 tablet 1  . meclizine (ANTIVERT) 25 MG tablet TAKE 1 TABLET THREE TIMES A DAY AS NEEDED 90 tablet 2  . meclizine (ANTIVERT) 25 MG tablet Take 1 tablet (25 mg total) by mouth 3 (three) times daily as needed. 90 tablet 2  . metoprolol (LOPRESSOR) 50 MG tablet Take 50 mg by mouth as directed. 1/2 tablet by mouth twice a day    . metoprolol (LOPRESSOR) 50 MG tablet TAKE 1 TABLET BY MOUTH EVERY DAY 90 tablet 0  . nitroGLYCERIN (NITROSTAT) 0.4 MG SL tablet Place 1 tablet (0.4 mg total) under the tongue every 5 (five) minutes as needed. 25 tablet 3  . ondansetron (ZOFRAN) 8 MG tablet Take by mouth every 8 (eight) hours as needed.    . polyethylene glycol (MIRALAX / GLYCOLAX) packet Take 17 g by mouth 2 (two) times daily.    . potassium chloride (MICRO-K) 10 MEQ CR capsule TAKE ONE CAPSULE BY MOUTH TWICE A DAY 180 capsule 1  . Probiotic Product (ALIGN PO) Take 1 tablet by mouth daily.     . promethazine (PHENERGAN) 25 MG tablet Take 1 tablet (25 mg total) by mouth every 6 (six) hours as needed. 30 tablet 1  . Pseudoephedrine-DM-GG (SUDAFED COUGH PO) Take by mouth as needed.    . simethicone (MYLICON) 80 MG chewable tablet Chew 80 mg by mouth every 6 (six) hours as needed. Gas  x as needed     No current facility-administered medications for this visit.    Allergies:   Cardura; Clarithromycin; Erythromycin; Guaifenesin; and Ketorolac tromethamine    Social History:  The patient  reports that she has never smoked. She does not have any smokeless tobacco history on file. She reports that she does not drink alcohol.   Family History:  The patient's family history includes Arthritis in her father and mother; Cancer (age of onset: 73) in her  father; Heart disease in her other; Hypertension in her other.    ROS:  Please see the history of present illness.   Otherwise, review of systems are positive for none.   All other systems are reviewed and negative.    PHYSICAL EXAM: VS:  BP 138/76 mmHg  Pulse 64  Ht 4\' 11"  (1.499 m)  Wt 185 lb (83.915 kg)  BMI 37.35 kg/m2 , BMI Body mass index is 37.35 kg/(m^2). GEN: Well nourished, well developed, in no acute distress HEENT: normal Neck: no JVD, carotid bruits, or masses Cardiac: RRR; no murmurs, rubs, or gallops,no edema  Respiratory:  clear to auscultation bilaterally, normal work of breathing GI: soft, nontender, nondistended, + BS MS: no deformity or atrophy Skin: warm and dry, no rash Neuro:  Strength and sensation are intact Psych: euthymic mood, full affect   EKG:  EKG is not ordered today.    Recent Labs: 12/22/2013: Hemoglobin 13.0; Platelets 262 11/06/2014: ALT 10; BUN 10; Creatinine 0.68; Potassium 3.9; Sodium 134*    Lipid Panel    Component Value Date/Time   CHOL 196 11/06/2014 1051   TRIG 155.0* 11/06/2014 1051   HDL 50.80 11/06/2014 1051   CHOLHDL 4 11/06/2014 1051   VLDL 31.0 11/06/2014 1051   LDLCALC 114* 11/06/2014 1051   LDLDIRECT 144.4 02/12/2011 1018      Wt Readings from Last 3 Encounters:  11/08/14 185 lb (83.915 kg)  07/03/14 183 lb (83.008 kg)  03/02/14 184 lb (83.462 kg)         ASSESSMENT AND PLAN:   Expand All Collapse All       Neill Loft Date of  Birth: 12-02-1933 [place] [address] [place],  [address] [phone] Fax [phone]   History of Present Illness: This pleasant 81 year old woman is seen back for a four-month followup office visit. She has a past history of ischemic heart disease. She has a long history of hypertensive cardiovascular disease and hypercholesterolemia. She has had previous bypass graft surgery in [place]. Her last nuclear stress test was in 2012 which was preoperative prior to orthopedic surgery. She has multiple orthopedic issues. She has spina bifida and scoliosis. She also has had a lot of GI issues with severe constipation alternating with explosive diarrhea and fecal incontinence. She ambulates with difficulty and is in a wheelchair most of the time. When she does walk she uses a walker. She's had a lot of problems with nasal stuffiness and difficulty breathing at night. Since last visit she has continued to be involved with multiple systems disease. She feels that at the present time her gastrointestinal problems are her main problem. She has been seeing a gastroenterologist at [facility] but will be switching her care to Dr. Paulita Fujita. Since last visit she has seen her new PCP Dr. Ardeth Perfect and had a bone density test which showed osteopenia. She has had a lot of problem with blood pressure and heart racing at night associated with some chest discomfort. Her last Myoview stress test was in January 2012. At her last visit we talked about scheduling another Myoview but she was sick on the scheduled date and has not rescheduled. She is not having any active chest discomfort. Her main problems continue to be gastrointestinal. She stays intensely nauseated all day worse in the morning and feels best in the evening. She has problems with constipation. She takes Lynzess on an intermittent basis.  Current Outpatient Prescriptions  Medication Sig Dispense  Refill  .  AMITIZA 8 MCG capsule 1 tab daily occ    . amLODipine (NORVASC) 5 MG tablet TAKE 1 TABLET (5 MG TOTAL) BY MOUTH 2 (TWO) TIMES DAILY. 180 tablet 1  . aspirin 81 MG EC tablet Take 81 mg by mouth daily.     Marland Kitchen atorvastatin (LIPITOR) 40 MG tablet Take 1 tablet (40 mg total) by mouth daily. 90 tablet 3  . diazepam (VALIUM) 5 MG tablet Take 1 tablet (5 mg total) by mouth daily as needed. 30 tablet 0  . docusate sodium (COLACE) 100 MG capsule Take 100 mg by mouth 2 (two) times daily.     Marland Kitchen esomeprazole (NEXIUM) 40 MG capsule Take 40 mg by mouth daily before breakfast.    . fluticasone (FLONASE) 50 MCG/ACT nasal spray USE 2 SPRAYS IN EACH NOSTRIL EVERY DAY 16 g 3  . lidocaine (LIDODERM) 5 % Place 1 patch onto the skin daily. Remove & Discard patch within 12 hours or as directed by MD    . Rolan Lipa 145 MCG CAPS capsule Take 145 mcg by mouth daily.     . Loperamide HCl (IMODIUM A-D PO) Take 1 tablet by mouth daily as needed (loose stool). As needed    . losartan-hydrochlorothiazide (HYZAAR) 100-25 MG per tablet Take 0.5 tablets by mouth daily. Taking 1/2 daily    . meclizine (ANTIVERT) 25 MG tablet TAKE 1 TABLET THREE TIMES A DAY AS NEEDED 90 tablet 2  . metoprolol (LOPRESSOR) 50 MG tablet Take 50 mg by mouth daily.    . metoprolol (LOPRESSOR) 50 MG tablet Take 1 tablet (50 mg total) by mouth daily. 90 tablet 0  . nitroGLYCERIN (NITROSTAT) 0.4 MG SL tablet Place 1 tablet (0.4 mg total) under the tongue every 5 (five) minutes as needed. 25 tablet 3  . ondansetron (ZOFRAN) 8 MG tablet Take by mouth every 8 (eight) hours as needed.    . polyethylene glycol (MIRALAX / GLYCOLAX) packet Take 17 g by mouth 2 (two) times daily.    . potassium chloride (MICRO-K) 10 MEQ CR capsule TAKE ONE CAPSULE BY MOUTH TWICE A DAY 180 capsule 0  . Probiotic Product (ALIGN PO) Take 1  tablet by mouth daily.     . promethazine (PHENERGAN) 25 MG tablet Take 1 tablet (25 mg total) by mouth every 6 (six) hours as needed. 30 tablet 1  . Pseudoephedrine-DM-GG (SUDAFED COUGH PO) Take by mouth as needed.    . simethicone (MYLICON) 80 MG chewable tablet Chew 80 mg by mouth every 6 (six) hours as needed. Gas x as needed     No current facility-administered medications for this visit.    Allergies  Allergen Reactions  . Cardura [Doxazosin Mesylate]   . Clarithromycin   . Erythromycin   . Guaifenesin   . Ketorolac Tromethamine     Patient Active Problem List   Diagnosis Date Noted  . Hypokalemia 12/21/2013  . Emesis, persistent 12/21/2013  . Cushing syndrome   . Allergic rhinitis   . Ischemic heart disease 02/25/2011  . Benign hypertensive heart disease without heart failure 02/25/2011  . Hypercholesterolemia 02/25/2011  . Spina bifida of lumbar spine 02/25/2011  . Irritable bowel syndrome (IBS) 02/25/2011    History  Smoking status  . Never Smoker   Smokeless tobacco  . Not on file    Comment: married, lives with spouse. 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    History  Alcohol Use No    Family History  Problem Relation Age of  Onset  . Cancer Father 28    colon  . Arthritis Father   . Arthritis Mother   . Heart disease Other   . Hypertension Other     Review of Systems: Constitutional: no fever chills diaphoresis or fatigue or change in weight.  Head and neck: no hearing loss, no epistaxis, no photophobia or visual disturbance. Respiratory: No cough, shortness of breath or wheezing. Cardiovascular: No chest pain peripheral edema, palpitations. Gastrointestinal: No abdominal distention, no abdominal pain, no change in bowel habits hematochezia or melena. Genitourinary: No dysuria, no frequency, no  urgency, no nocturia. Musculoskeletal:No arthralgias, no back pain, no gait disturbance or myalgias. Neurological: No dizziness, no headaches, no numbness, no seizures, no syncope, no weakness, no tremors. Hematologic: No lymphadenopathy, no easy bruising. Psychiatric: No confusion, no hallucinations, no sleep disturbance.    Physical Exam: Filed Vitals:   07/03/14 1612  BP: 130/68  Pulse: 65   the general appearance is that of a elderly female who is in a wheelchair.The head and neck exam reveals pupils equal and reactive. Extraocular movements are full. There is no scleral icterus. The mouth and pharynx are normal. The neck is supple. The carotids reveal no bruits. The jugular venous pressure is normal. The thyroid is not enlarged. There is no lymphadenopathy. The chest is clear to percussion and auscultation. There are no rales or rhonchi. Expansion of the chest is symmetrical. The precordium is quiet. The first heart sound is normal. The second heart sound is physiologically split. There is no murmur gallop rub or click. There is no abnormal lift or heave. The abdomen is soft and nontender. The bowel sounds are normal. The liver and spleen are not enlarged. There are no abdominal masses. There are no abdominal bruits. Extremities reveal good pedal pulses. There is no phlebitis or edema. There is no cyanosis or clubbing. Strength is normal and symmetrical in all extremities. There is no lateralizing weakness. There are no sensory deficits. The skin is warm and dry. There is no rash. She has significant kyphoscoliosis.  EKG shows normal sinus rhythm with nonspecific T-wave changes      Assessment / Plan: 1. ischemic heart disease 2. hypertensive heart disease without heart failure 3. spina bifida and scoliosis 4. Hypercholesterolemia 5. irritable bowel syndrome 6. osteopenia  Plan: Continue same medication except increase Lipitor to 40 mg daily            Current medicines are reviewed at length with the patient today.  The patient does not have concerns regarding medicines.  The following changes have been made:  no change  Labs/ tests ordered today include: None  No orders of the defined types were placed in this encounter.    Return for a Lexiscan Myoview stress test.  To follow-up on previous CABG in 2008.  She is having dyspnea but no chest discomfort  Disposition:   FU with Dr. Mare Ferrari in 4 months   Signed, Darlin Coco, MD  11/08/2014 6:03 PM    [facility] [place], [place], [place]  [address] Phone: [phone]; Fax: [phone]

## 2014-11-08 NOTE — Patient Instructions (Signed)
Your physician has requested that you have a lexiscan myoview. For further information please visit [email or web address]. Please follow instruction sheet, as given.  Your physician recommends that you continue on your current medications as directed. Please refer to the Current Medication list given to you today.  Your physician recommends that you schedule a follow-up appointment in: [place]

## 2014-11-09 ENCOUNTER — Telehealth: Payer: Self-pay | Admitting: Cardiology

## 2014-11-09 NOTE — Telephone Encounter (Signed)
Thanks.  No changes needed.

## 2014-11-09 NOTE — Telephone Encounter (Signed)
Reviewed labs with patient and mailed copy

## 2014-11-09 NOTE — Telephone Encounter (Signed)
New Msg         Pt calling, would like to get lab results.    Please return call.

## 2014-11-20 ENCOUNTER — Ambulatory Visit (HOSPITAL_COMMUNITY): Payer: Medicare Other | Attending: Internal Medicine | Admitting: Radiology

## 2014-11-20 DIAGNOSIS — I119 Hypertensive heart disease without heart failure: Secondary | ICD-10-CM

## 2014-11-20 DIAGNOSIS — E7800 Pure hypercholesterolemia, unspecified: Secondary | ICD-10-CM

## 2014-11-20 DIAGNOSIS — I259 Chronic ischemic heart disease, unspecified: Secondary | ICD-10-CM | POA: Insufficient documentation

## 2014-11-20 DIAGNOSIS — R0609 Other forms of dyspnea: Secondary | ICD-10-CM | POA: Diagnosis not present

## 2014-11-20 MED ORDER — AMINOPHYLLINE 25 MG/ML IV SOLN
75.0000 mg | Freq: Once | INTRAVENOUS | Status: AC
  Administered 2014-11-20: 75 mg via INTRAVENOUS

## 2014-11-20 MED ORDER — REGADENOSON 0.4 MG/5ML IV SOLN
0.4000 mg | Freq: Once | INTRAVENOUS | Status: AC
  Administered 2014-11-20: 0.4 mg via INTRAVENOUS

## 2014-11-20 MED ORDER — TECHNETIUM TC 99M SESTAMIBI GENERIC - CARDIOLITE
11.0000 | Freq: Once | INTRAVENOUS | Status: AC | PRN
  Administered 2014-11-20: 11 via INTRAVENOUS

## 2014-11-20 MED ORDER — TECHNETIUM TC 99M SESTAMIBI GENERIC - CARDIOLITE
33.0000 | Freq: Once | INTRAVENOUS | Status: AC | PRN
  Administered 2014-11-20: 33 via INTRAVENOUS

## 2014-11-20 NOTE — Progress Notes (Signed)
[facility] NUCLE[facility] [place], [place] [address] [phone]    Cardiology Nuclear Med Study  Vanessa Nielsen is a 81 y.o. female     MRN : [id number]     DOB: 07-13-1934  Procedure Date: 11/20/2014  Nuclear Med Background Indication for Stress Test:  Evaluation for Ischemia and Graft Patency History:  CAD, MPI 2012 (ischemia) not gated Cardiac Risk Factors: Hypertension and Lipids  Symptoms:  DOE and Palpitations   Nuclear Pre-Procedure Caffeine/Decaff Intake:  None> 12 hrs NPO After: 8:05am   Lungs:  clear O2 Sat: 93% on room air. IV 0.9% NS with Angio Cath:  22g  IV Site: R Antecubital x 1, tolerated well IV Started by:  Irven Baltimore, RN  Chest Size (in):  38 Cup Size: C  Height: 4\' 11"  (1.499 m)  Weight:  186 lb (84.369 kg)  BMI:  Body mass index is 37.55 kg/(m^2). Tech Comments:  Patient took Metoprolol this am. Irven Baltimore, RN.    Nuclear Med Study 1 or 2 day study: 1 day  Stress Test Type:  Carlton Adam  Reading MD: N/A  Order Authorizing Provider:  Darlin Coco, MD  Resting Radionuclide: Technetium 83m Sestamibi  Resting Radionuclide Dose: 11.0 mCi   Stress Radionuclide:  Technetium 61m Sestamibi  Stress Radionuclide Dose: 33.0 mCi           Stress Protocol Rest HR: 64 Stress HR: 85  Rest BP: 153/84 Stress BP: 145/63  Exercise Time (min): n/a METS: n/a   Predicted Max HR: 142 bpm % Max HR: 59.86 bpm Rate Pressure Product: 13260   Dose of Adenosine (mg):  n/a Dose of Lexiscan: 0.4 mg  Dose of Atropine (mg): n/a Dose of Dobutamine: n/a mcg/kg/min (at max HR)  Stress Test Technologist: Glade Lloyd, BS-ES  Nuclear Technologist:  Earl Many, CNMT     Rest Procedure:  Myocardial perfusion imaging was performed at rest 45 minutes following the intravenous administration of Technetium 69m Sestamibi. Rest ECG: NSR - Normal EKG  Stress Procedure:  The patient received IV Lexiscan 0.4 mg over 15-seconds.  Technetium 77m  Sestamibi injected at 30-seconds.  Quantitative spect images were obtained after a 45 minute delay.  During the infusion of Lexiscan the patient complained of SOB, dizziness and nausea.  These symptoms began to subside in recovery.  Stress ECG: No significant change from baseline ECG  QPS Raw Data Images:  Normal; no motion artifact; normal heart/lung ratio. Stress Images:  Normal homogeneous uptake in all areas of the myocardium. Rest Images:  Normal homogeneous uptake in all areas of the myocardium. Subtraction (SDS):  No evidence of ischemia. Transient Ischemic Dilatation (Normal <1.22):  1.08 Lung/Heart Ratio (Normal <0.45):  0.33  Quantitative Gated Spect Images QGS EDV:  67 ml QGS ESV:  25 ml  Impression Exercise Capacity:  Lexiscan with no exercise. BP Response:  Normal blood pressure response. Clinical Symptoms:  There was mild dyspnea. ECG Impression:  No significant ST segment change suggestive of ischemia. Comparison with Prior Nuclear Study: No images to compare  Overall Impression:  Normal stress nuclear study.  LV Ejection Fraction: 62%.  LV Wall Motion:  NL LV Function; NL Wall Motion   Vanessa Nielsen 11/20/2014

## 2015-01-01 ENCOUNTER — Other Ambulatory Visit: Payer: Self-pay | Admitting: Cardiology

## 2015-01-29 DIAGNOSIS — K5900 Constipation, unspecified: Secondary | ICD-10-CM | POA: Diagnosis not present

## 2015-02-01 ENCOUNTER — Other Ambulatory Visit: Payer: Self-pay | Admitting: Adult Health

## 2015-02-18 ENCOUNTER — Other Ambulatory Visit: Payer: Self-pay | Admitting: Cardiology

## 2015-02-19 ENCOUNTER — Ambulatory Visit: Payer: Medicare Other | Admitting: Cardiology

## 2015-02-28 DIAGNOSIS — M419 Scoliosis, unspecified: Secondary | ICD-10-CM | POA: Diagnosis not present

## 2015-02-28 DIAGNOSIS — J302 Other seasonal allergic rhinitis: Secondary | ICD-10-CM | POA: Diagnosis not present

## 2015-02-28 DIAGNOSIS — M6281 Muscle weakness (generalized): Secondary | ICD-10-CM | POA: Diagnosis not present

## 2015-02-28 DIAGNOSIS — K5900 Constipation, unspecified: Secondary | ICD-10-CM | POA: Diagnosis not present

## 2015-02-28 DIAGNOSIS — Z6841 Body Mass Index (BMI) 40.0 and over, adult: Secondary | ICD-10-CM | POA: Diagnosis not present

## 2015-03-12 ENCOUNTER — Telehealth: Payer: Self-pay | Admitting: Cardiology

## 2015-03-12 NOTE — Telephone Encounter (Signed)
New Message     PCP  Dr. Arelia Longest wants a copy of the Pt stress test I informed pt   That the office usually request by fax or by calling over to [facility] but pt wants to speak to RN to   See if anything can be done

## 2015-03-12 NOTE — Telephone Encounter (Signed)
Calling requesting that we send Dr. Ardeth Perfect a copy of her stress test done recently.  Advised will send to him at [facility]. Fax [phone].

## 2015-03-23 ENCOUNTER — Other Ambulatory Visit: Payer: Self-pay | Admitting: Cardiology

## 2015-03-29 ENCOUNTER — Ambulatory Visit (INDEPENDENT_AMBULATORY_CARE_PROVIDER_SITE_OTHER): Payer: Medicare Other | Admitting: Cardiology

## 2015-03-29 ENCOUNTER — Encounter: Payer: Self-pay | Admitting: Cardiology

## 2015-03-29 VITALS — BP 132/70 | HR 67 | Ht 59.0 in | Wt 189.0 lb

## 2015-03-29 DIAGNOSIS — K589 Irritable bowel syndrome without diarrhea: Secondary | ICD-10-CM

## 2015-03-29 DIAGNOSIS — E7800 Pure hypercholesterolemia, unspecified: Secondary | ICD-10-CM

## 2015-03-29 DIAGNOSIS — I119 Hypertensive heart disease without heart failure: Secondary | ICD-10-CM

## 2015-03-29 DIAGNOSIS — I259 Chronic ischemic heart disease, unspecified: Secondary | ICD-10-CM | POA: Diagnosis not present

## 2015-03-29 MED ORDER — METOPROLOL TARTRATE 50 MG PO TABS: 50.0000 mg | ORAL_TABLET | ORAL | Status: DC

## 2015-03-29 NOTE — Progress Notes (Signed)
Cardiology Office Note   Date:  03/29/2015   ID:  Vanessa Nielsen, DOB 10/12/1933, MRN [id number]  PCP:  Velna Hatchet, MD  Cardiologist: Darlin Coco MD  Chief Complaint  Patient presents with  . Constipation      History of Present Illness: Vanessa Nielsen is a 81 y.o. female who presents for a four-month follow-up office visit.  This pleasant 81 year old woman is seen back for a four-month followup office visit. She has a past history of ischemic heart disease. She has a long history of hypertensive cardiovascular disease and hypercholesterolemia. She has had previous bypass graft surgery at [facility] in [place] in about 2008. Her last nuclear stress test was in 2012 which was preoperative prior to orthopedic surgery. She has multiple orthopedic issues. She has spina bifida and scoliosis. She also has had a lot of GI issues with severe constipation alternating with explosive diarrhea and fecal incontinence. She ambulates with difficulty and is in a wheelchair most of the time. When she does walk she uses a walker. She's had a lot of problems with nasal stuffiness and difficulty breathing at night. Since last visit she has continued to be involved with multiple systems disease. She feels that at the present time her gastrointestinal problems are her main problem. She had been seeing a gastroenterologist at [facility] but has switched her care to Dr. Paulita Fujita. Since last visit she has seen her new PCP Dr. Ardeth Perfect and had a bone density test which showed osteopenia. She has had a lot of problem with blood pressure and heart racing at night associated with some chest discomfort. Her last Myoview stress test was on 11/21/14 and was a [place] study with an ejection fraction of 62% and no ischemia. . Her main problems continue to be gastrointestinal. She stays intensely nauseated all day worse in the morning and feels best in the evening. She has  problems with constipation. She takes Lynzess on an intermittent basis.  The lens as seems to help her nausea.  She is now being followed by Dr. Paulita Fujita for GI.  He did a recent colonoscopy which she states was unremarkable.  Past Medical History  Diagnosis Date  . Hyperlipidemia   . Hypertension   . Coronary artery disease     2v CABG @ [facility]  . Scoliosis   . Spina bifida   . Osteoarthritis   . Iatrogenic Cushing's disease   . Hypercholesteremia   . Allergic rhinitis   . Multiple allergies     induced by pain: swelling in UE/face/torso, nausea, hives followed by hypotension unless tx with high dose steroids    Past Surgical History  Procedure Laterality Date  . Cardiac catheterization  11/2006  . Total knee arthroplasty  09/2010    right  . Cholecystectomy, laparoscopic  05/27/2010  . Coronary angioplasty  11/2006    left anterior descending vessel  . Cardiovascular stress test  09/19/2010  . Cholecystectomy  2011  . Appendectomy  1960  . Tonsillectomy  1943  . Abdominal hysterectomy  1981 or 82  . Surgery for scoliosis  1995  . Hardware removed in spine due to loos screws  1997  . Anal rectal manometry N/A 02/13/2014    Procedure: ANAL RECTAL MANOMETRY;  Surgeon: Arta Silence, MD;  Location: [facility];  Service: Endoscopy;  Laterality: N/A;     Current Outpatient Prescriptions  Medication Sig Dispense Refill  . AMITIZA 8 MCG capsule 1 tab daily occ    .  amLODipine (NORVASC) 5 MG tablet Take 5 mg by mouth as directed. Take 1 tablet by mouth if systolic blood pressure above 150    . aspirin 81 MG EC tablet Take 81 mg by mouth daily.      Marland Kitchen atorvastatin (LIPITOR) 40 MG tablet Take 1 tablet (40 mg total) by mouth daily. 90 tablet 3  . diazepam (VALIUM) 5 MG tablet Take 1 tablet (5 mg total) by mouth daily as needed. 30 tablet 0  . docusate sodium (COLACE) 100 MG capsule Take 100 mg by mouth 2 (two) times daily.     Marland Kitchen esomeprazole (NEXIUM) 40 MG capsule Take 40 mg by mouth at  bedtime.     . fluticasone (FLONASE) 50 MCG/ACT nasal spray USE 2 SPRAYS IN EACH NOSTRIL EVERY DAY 16 g 3  . lidocaine (LIDODERM) 5 % Place 1 patch onto the skin daily. Remove & Discard patch within 12 hours or as directed by MD    . Rolan Lipa 290 MCG CAPS capsule Take 290 mcg by mouth daily.  1  . Loperamide HCl (IMODIUM A-D PO) Take 1 tablet by mouth daily as needed (loose stool). As needed    . losartan-hydrochlorothiazide (HYZAAR) 100-25 MG per tablet TAKE 1 TABLET EVERY DAY 90 tablet 1  . meclizine (ANTIVERT) 25 MG tablet TAKE 1 TABLET THREE TIMES A DAY AS NEEDED 90 tablet 2  . metoprolol (LOPRESSOR) 50 MG tablet Take 1 tablet (50 mg total) by mouth as directed. 1/2 tablet by mouth twice a day 45 tablet 3  . nitroGLYCERIN (NITROSTAT) 0.4 MG SL tablet Place 1 tablet (0.4 mg total) under the tongue every 5 (five) minutes as needed. 25 tablet 3  . ondansetron (ZOFRAN) 8 MG tablet Take by mouth every 8 (eight) hours as needed.    . polyethylene glycol (MIRALAX / GLYCOLAX) packet Take 17 g by mouth 2 (two) times daily.    . potassium chloride (MICRO-K) 10 MEQ CR capsule TAKE ONE CAPSULE BY MOUTH TWICE A DAY 180 capsule 1  . predniSONE (DELTASONE) 20 MG tablet Take 20 mg by mouth 2 (two) times daily as needed (FOR BACK PAIN).   0  . Probiotic Product (ALIGN PO) Take 1 tablet by mouth daily.     . promethazine (PHENERGAN) 25 MG tablet Take 1 tablet (25 mg total) by mouth every 6 (six) hours as needed. 30 tablet 1  . Pseudoephedrine-DM-GG (SUDAFED COUGH PO) Take by mouth as needed.    . simethicone (MYLICON) 80 MG chewable tablet Chew 80 mg by mouth every 6 (six) hours as needed. Gas x as needed     No current facility-administered medications for this visit.    Allergies:   Cardura; Clarithromycin; Erythromycin; Guaifenesin; and Ketorolac tromethamine    Social History:  The patient  reports that she has never smoked. She does not have any smokeless tobacco history on file. She reports that she  does not drink alcohol.   Family History:  The patient's family history includes Arthritis in her father and mother; Cancer (age of onset: 21) in her father; Heart disease in her other; Hypertension in her maternal aunt and other. There is no history of Heart attack or Stroke.    ROS:  Please see the history of present illness.   Otherwise, review of systems are positive for none.   All other systems are reviewed and negative.    PHYSICAL EXAM: VS:  BP 132/70 mmHg  Pulse 67  Ht 4\' 11"  (1.499 m)  Wt 189 lb (85.73 kg)  BMI 38.15 kg/m2 , BMI Body mass index is 38.15 kg/(m^2). GEN: Well nourished, well developed, in no acute distress.  She travels by wheelchair. HEENT: normal Neck: no JVD, carotid bruits, or masses Cardiac: RRR; no murmurs, rubs, or gallops,no edema  Respiratory:  clear to auscultation bilaterally, normal work of breathing GI: soft, nontender, nondistended, + BS MS: no deformity or atrophy.  She has kyphoscoliosis Skin: warm and dry, no rash Neuro:  Strength and sensation are intact Psych: euthymic mood, full affect   EKG:  EKG is ordered today. The ekg ordered today demonstrates normal sinus rhythm, low voltage QRS, poor R wave progression anteriorly, since last tracing of 03/02/14, no significant change   Recent Labs: 11/06/2014: ALT 10; BUN 10; Creatinine, Ser 0.68; Potassium 3.9; Sodium 134*    Lipid Panel    Component Value Date/Time   CHOL 196 11/06/2014 1051   TRIG 155.0* 11/06/2014 1051   HDL 50.80 11/06/2014 1051   CHOLHDL 4 11/06/2014 1051   VLDL 31.0 11/06/2014 1051   LDLCALC 114* 11/06/2014 1051   LDLDIRECT 144.4 02/12/2011 1018      Wt Readings from Last 3 Encounters:  03/29/15 189 lb (85.73 kg)  11/20/14 186 lb (84.369 kg)  11/08/14 185 lb (83.915 kg)       ASSESSMENT AND PLAN:  1. ischemic heart disease 2. hypertensive heart disease without heart failure 3. spina bifida and scoliosis 4. Hypercholesterolemia 5. irritable bowel  syndrome 6. osteopenia   Current medicines are reviewed at length with the patient today.  The patient does not have concerns regarding medicines.  The following changes have been made:  no change  Labs/ tests ordered today include:   Orders Placed This Encounter  Procedures  . EKG 12-Lead     Disposition: The same medication.  Recheck in 4 months  Signed, Darlin Coco MD 03/29/2015 5:48 PM    [facility] [place], [place], [place]  [address] Phone: [phone]; Fax: [phone]

## 2015-03-29 NOTE — Patient Instructions (Signed)
Medication Instructions:  Your physician recommends that you continue on your current medications as directed. Please refer to the Current Medication list given to you today.  Labwork: NONE  Testing/Procedures: NONE  Follow-Up: Your physician wants you to follow-up in: [place] will receive a reminder letter in the mail two months in advance. If you don't receive a letter, please call our office to schedule the follow-up appointment.

## 2015-04-09 DIAGNOSIS — E785 Hyperlipidemia, unspecified: Secondary | ICD-10-CM | POA: Diagnosis not present

## 2015-04-09 DIAGNOSIS — E559 Vitamin D deficiency, unspecified: Secondary | ICD-10-CM | POA: Diagnosis not present

## 2015-04-09 DIAGNOSIS — I2510 Atherosclerotic heart disease of native coronary artery without angina pectoris: Secondary | ICD-10-CM | POA: Diagnosis not present

## 2015-04-18 DIAGNOSIS — E559 Vitamin D deficiency, unspecified: Secondary | ICD-10-CM | POA: Diagnosis not present

## 2015-04-18 DIAGNOSIS — Z1389 Encounter for screening for other disorder: Secondary | ICD-10-CM | POA: Diagnosis not present

## 2015-04-18 DIAGNOSIS — Z0000 Encounter for general adult medical examination without abnormal findings: Secondary | ICD-10-CM | POA: Diagnosis not present

## 2015-04-18 DIAGNOSIS — I25728 Atherosclerosis of autologous artery coronary artery bypass graft(s) with other forms of angina pectoris: Secondary | ICD-10-CM | POA: Diagnosis not present

## 2015-04-18 DIAGNOSIS — K5900 Constipation, unspecified: Secondary | ICD-10-CM | POA: Diagnosis not present

## 2015-04-18 DIAGNOSIS — Z6841 Body Mass Index (BMI) 40.0 and over, adult: Secondary | ICD-10-CM | POA: Diagnosis not present

## 2015-04-18 DIAGNOSIS — E785 Hyperlipidemia, unspecified: Secondary | ICD-10-CM | POA: Diagnosis not present

## 2015-04-18 DIAGNOSIS — I10 Essential (primary) hypertension: Secondary | ICD-10-CM | POA: Diagnosis not present

## 2015-04-18 DIAGNOSIS — Z23 Encounter for immunization: Secondary | ICD-10-CM | POA: Diagnosis not present

## 2015-04-18 DIAGNOSIS — R2689 Other abnormalities of gait and mobility: Secondary | ICD-10-CM | POA: Diagnosis not present

## 2015-04-19 DIAGNOSIS — Z1212 Encounter for screening for malignant neoplasm of rectum: Secondary | ICD-10-CM | POA: Diagnosis not present

## 2015-05-10 ENCOUNTER — Other Ambulatory Visit: Payer: Self-pay | Admitting: Cardiology

## 2015-05-10 DIAGNOSIS — K5900 Constipation, unspecified: Secondary | ICD-10-CM | POA: Diagnosis not present

## 2015-05-10 NOTE — Telephone Encounter (Signed)
Okay to refill meclizine

## 2015-05-14 ENCOUNTER — Other Ambulatory Visit: Payer: Self-pay | Admitting: Cardiology

## 2015-05-14 DIAGNOSIS — M19011 Primary osteoarthritis, right shoulder: Secondary | ICD-10-CM | POA: Diagnosis not present

## 2015-05-14 NOTE — Telephone Encounter (Signed)
Okay to refill? 

## 2015-05-31 ENCOUNTER — Other Ambulatory Visit: Payer: Self-pay

## 2015-05-31 MED ORDER — AMLODIPINE BESYLATE 5 MG PO TABS: 5.0000 mg | ORAL_TABLET | ORAL | Status: DC

## 2015-05-31 NOTE — Telephone Encounter (Signed)
Vanessa Coco, MD at 03/29/2015 4:24 PM  amLODipine (NORVASC) 5 MG tabletTake 5 mg by mouth as directed. Take 1 tablet by mouth if systolic blood pressure above 150  Current medicines are reviewed at length with the patient today. The patient does not have concerns regarding medicines.  The following changes have been made: no change

## 2015-06-04 DIAGNOSIS — R300 Dysuria: Secondary | ICD-10-CM | POA: Diagnosis not present

## 2015-06-04 DIAGNOSIS — N390 Urinary tract infection, site not specified: Secondary | ICD-10-CM | POA: Diagnosis not present

## 2015-07-05 ENCOUNTER — Other Ambulatory Visit: Payer: Self-pay | Admitting: Cardiology

## 2015-07-05 MED ORDER — METOPROLOL TARTRATE 50 MG PO TABS: 25.0000 mg | ORAL_TABLET | Freq: Two times a day (BID) | ORAL | Status: DC

## 2015-07-19 DIAGNOSIS — Z23 Encounter for immunization: Secondary | ICD-10-CM | POA: Diagnosis not present

## 2015-07-30 ENCOUNTER — Other Ambulatory Visit: Payer: Self-pay | Admitting: Cardiology

## 2015-09-18 ENCOUNTER — Other Ambulatory Visit: Payer: Self-pay | Admitting: Cardiology

## 2015-09-24 ENCOUNTER — Other Ambulatory Visit: Payer: Self-pay | Admitting: Cardiology

## 2015-10-01 DIAGNOSIS — L218 Other seborrheic dermatitis: Secondary | ICD-10-CM | POA: Diagnosis not present

## 2015-10-01 DIAGNOSIS — Z85828 Personal history of other malignant neoplasm of skin: Secondary | ICD-10-CM | POA: Diagnosis not present

## 2015-10-08 ENCOUNTER — Other Ambulatory Visit: Payer: Self-pay

## 2015-10-08 DIAGNOSIS — Z1231 Encounter for screening mammogram for malignant neoplasm of breast: Secondary | ICD-10-CM

## 2015-10-15 DIAGNOSIS — R350 Frequency of micturition: Secondary | ICD-10-CM | POA: Diagnosis not present

## 2015-10-15 DIAGNOSIS — B351 Tinea unguium: Secondary | ICD-10-CM | POA: Diagnosis not present

## 2015-10-15 DIAGNOSIS — E784 Other hyperlipidemia: Secondary | ICD-10-CM | POA: Diagnosis not present

## 2015-10-15 DIAGNOSIS — I10 Essential (primary) hypertension: Secondary | ICD-10-CM | POA: Diagnosis not present

## 2015-10-15 DIAGNOSIS — K5900 Constipation, unspecified: Secondary | ICD-10-CM | POA: Diagnosis not present

## 2015-10-17 ENCOUNTER — Ambulatory Visit: Payer: Medicare Other

## 2015-10-17 ENCOUNTER — Ambulatory Visit
Admission: RE | Admit: 2015-10-17 | Discharge: 2015-10-17 | Disposition: A | Discharge status: Home / Self Care | Payer: Medicare Other | Source: Ambulatory Visit

## 2015-10-17 DIAGNOSIS — Z1231 Encounter for screening mammogram for malignant neoplasm of breast: Secondary | ICD-10-CM | POA: Diagnosis not present

## 2015-10-24 ENCOUNTER — Encounter: Payer: Self-pay | Admitting: Cardiology

## 2015-10-24 ENCOUNTER — Ambulatory Visit (INDEPENDENT_AMBULATORY_CARE_PROVIDER_SITE_OTHER): Payer: Medicare Other | Admitting: Cardiology

## 2015-10-24 VITALS — BP 140/66 | HR 60 | Ht 59.0 in | Wt 192.0 lb

## 2015-10-24 DIAGNOSIS — I259 Chronic ischemic heart disease, unspecified: Secondary | ICD-10-CM | POA: Diagnosis not present

## 2015-10-24 DIAGNOSIS — E7800 Pure hypercholesterolemia, unspecified: Secondary | ICD-10-CM | POA: Diagnosis not present

## 2015-10-24 DIAGNOSIS — K589 Irritable bowel syndrome without diarrhea: Secondary | ICD-10-CM

## 2015-10-24 DIAGNOSIS — I119 Hypertensive heart disease without heart failure: Secondary | ICD-10-CM | POA: Diagnosis not present

## 2015-10-24 NOTE — Patient Instructions (Signed)
Medication Instructions:  Your physician recommends that you continue on your current medications as directed. Please refer to the Current Medication list given to you today.  Labwork: none  Testing/Procedures: none  Follow-Up: Your physician wants you to follow-up in: 6 month ov with Dr Gwenlyn Found at the [facility] office  You will receive a reminder letter in the mail two months in advance. If you don't receive a letter, please call our office to schedule the follow-up appointment.  If you need a refill on your cardiac medications before your next appointment, please call your pharmacy.

## 2015-10-24 NOTE — Progress Notes (Signed)
Cardiology Office Note   Date:  10/24/2015   ID:  Vanessa Nielsen, DOB 1933-11-14, MRN [id number]  PCP:  Velna Hatchet, MD  Cardiologist: Darlin Coco MD  No chief complaint on file.     History of Present Illness: Vanessa Nielsen is a 82 y.o. female who presents for Scheduled 6 month follow-up visit   She has a past history of ischemic heart disease. She has a long history of hypertensive cardiovascular disease and hypercholesterolemia. She has had previous bypass graft surgery at [facility] in [place] in about 2008. She underwent a nuclear stress test  in 2012 which was preoperative prior to orthopedic surgery. She has multiple orthopedic issues. She has spina bifida and scoliosis. She also has had a lot of GI issues with severe constipation alternating with explosive diarrhea and fecal incontinence. She ambulates with difficulty and is in a wheelchair most of the time. When she does walk she uses a walker. She's had a lot of problems with nasal stuffiness and difficulty breathing at night. Since last visit she has continued to be involved with multiple systems disease. She feels that at the present time her gastrointestinal problems are her main problem. She had been seeing a gastroenterologist at [facility] but has switched her care to Dr. Paulita Fujita. Since last visit she has seen her new PCP Dr. Ardeth Perfect and had a bone density test which showed osteopenia. She has had a lot of problem with blood pressure and heart racing at night associated with some chest discomfort. Her last Myoview stress test was on 11/21/14 and was a [place] study with an ejection fraction of 62% and no ischemia. . Her main problems continue to be gastrointestinal. She stays intensely nauseated all day worse in the morning and feels best in the evening. She has problems with constipation. She takes Lynzess on an intermittent basis. This seems to help her nausea. She is now  being followed by Dr. Paulita Fujita for GI. He did a recent colonoscopy which she states was unremarkable. She has nocturia 3.  She is too unsteady to get out of bed at night by herself and so her husband has to help her get to the bathroom about 3 times a night.  Her PCP gave her some myrbetrig But she has been afraid to try it much because of the side effects of potential hypertension.  However, I encouraged her to try it because getting a good night sleep might help her blood pressure.  Past Medical History  Diagnosis Date  . Hyperlipidemia   . Hypertension   . Coronary artery disease     2v CABG @ [facility]  . Scoliosis   . Spina bifida   . Osteoarthritis   . Iatrogenic Cushing's disease ([place])   . Hypercholesteremia   . Allergic rhinitis   . Multiple allergies     induced by pain: swelling in UE/face/torso, nausea, hives followed by hypotension unless tx with high dose steroids    Past Surgical History  Procedure Laterality Date  . Cardiac catheterization  11/2006  . Total knee arthroplasty  09/2010    right  . Cholecystectomy, laparoscopic  05/27/2010  . Coronary angioplasty  11/2006    left anterior descending vessel  . Cardiovascular stress test  09/19/2010  . Cholecystectomy  2011  . Appendectomy  1960  . Tonsillectomy  1943  . Abdominal hysterectomy  1981 or 82  . Surgery for scoliosis  1995  . Hardware removed in spine  due to loos screws  1997  . Anal rectal manometry N/A 02/13/2014    Procedure: ANAL RECTAL MANOMETRY;  Surgeon: Arta Silence, MD;  Location: [facility];  Service: Endoscopy;  Laterality: N/A;     Current Outpatient Prescriptions  Medication Sig Dispense Refill  . AMITIZA 8 MCG capsule Take 8 mcg by mouth daily as needed for constipation. 1 tab daily occ    . amLODipine (NORVASC) 5 MG tablet Take 1 tablet (5 mg total) by mouth as directed. Take 1 tablet by mouth if systolic blood pressure above 150 90 tablet 1  . aspirin 81 MG EC tablet Take 81 mg by mouth daily.       Marland Kitchen atorvastatin (LIPITOR) 40 MG tablet Take 1 tablet (40 mg total) by mouth daily. 90 tablet 3  . diazepam (VALIUM) 5 MG tablet Take 5 mg by mouth at bedtime as needed (sleep).    . docusate sodium (COLACE) 100 MG capsule Take 100 mg by mouth 2 (two) times daily.     Marland Kitchen esomeprazole (NEXIUM) 40 MG capsule Take 40 mg by mouth at bedtime.     . fluticasone (FLONASE) 50 MCG/ACT nasal spray USE 2 SPRAYS INTO EACH NOSTRIL EVERY DAY 16 g 3  . lidocaine (LIDODERM) 5 % Place 1 patch onto the skin daily. Remove & Discard patch within 12 hours or as directed by MD    . Rolan Lipa 290 MCG CAPS capsule Take 290 mcg by mouth daily.  1  . Loperamide HCl (IMODIUM A-D PO) Take 1 tablet by mouth daily as needed (loose stool). As needed    . losartan-hydrochlorothiazide (HYZAAR) 100-25 MG tablet Take 1 tablet by mouth daily.    . meclizine (ANTIVERT) 25 MG tablet Take 25 mg by mouth 3 (three) times daily as needed for dizziness.    . metoprolol (LOPRESSOR) 50 MG tablet Take 0.5 tablets (25 mg total) by mouth 2 (two) times daily. 30 tablet 9  . nitroGLYCERIN (NITROSTAT) 0.4 MG SL tablet Place 0.4 mg under the tongue every 5 (five) minutes as needed for chest pain.    Marland Kitchen ondansetron (ZOFRAN) 8 MG tablet Take 8 mg by mouth every 8 (eight) hours as needed for nausea or vomiting.     . ondansetron (ZOFRAN) 8 MG tablet Take by mouth every 8 (eight) hours as needed for nausea or vomiting.    . polyethylene glycol (MIRALAX / GLYCOLAX) packet Take 17 g by mouth 2 (two) times daily.    . potassium chloride (MICRO-K) 10 MEQ CR capsule TAKE ONE CAPSULE BY MOUTH TWICE A DAY 180 capsule 0  . predniSONE (DELTASONE) 20 MG tablet Take 20 mg by mouth 2 (two) times daily as needed (FOR BACK PAIN).   0  . Probiotic Product (ALIGN PO) Take 1 tablet by mouth daily.     . promethazine (PHENERGAN) 25 MG tablet Take 25 mg by mouth every 6 (six) hours as needed for nausea or vomiting.    . Pseudoephedrine-DM-GG (SUDAFED COUGH PO) Take 1  tablet by mouth daily as needed (cough).     . simethicone (MYLICON) 80 MG chewable tablet Chew 80 mg by mouth every 6 (six) hours as needed for flatulence. Gas x as needed     No current facility-administered medications for this visit.    Allergies:   Cardura; Clarithromycin; Erythromycin; Guaifenesin; and Ketorolac tromethamine    Social History:  The patient  reports that she has never smoked. She does not have any smokeless tobacco history on  file. She reports that she does not drink alcohol.   Family History:  The patient's family history includes Arthritis in her father and mother; Cancer (age of onset: 58) in her father; Heart disease in her other; Hypertension in her maternal aunt and other. There is no history of Heart attack or Stroke.    ROS:  Please see the history of present illness.   Otherwise, review of systems are positive for none.   All other systems are reviewed and negative.    PHYSICAL EXAM: VS:  BP 140/66 mmHg  Pulse 60  Ht 4\' 11"  (1.499 m)  Wt 192 lb (87.091 kg)  BMI 38.76 kg/m2 , BMI Body mass index is 38.76 kg/(m^2). GEN: Well nourished, well developed, in no acute distress HEENT: normal Neck: no JVD, carotid bruits, or masses Cardiac: RRR; no murmurs, rubs, or gallops,no edema  Respiratory:  clear to auscultation bilaterally, normal work of breathing GI: soft, nontender, nondistended, + BS MS: no deformity or atrophy.She has scoliosis from her spina bifida Skin: warm and dry, no rash Neuro:  Strength and sensation are intact Psych: euthymic mood, full affect   EKG:  EKG is not ordered today.    Recent Labs: 11/06/2014: ALT 10; BUN 10; Creatinine, Ser 0.68; Potassium 3.9; Sodium 134*    Lipid Panel    Component Value Date/Time   CHOL 196 11/06/2014 1051   TRIG 155.0* 11/06/2014 1051   HDL 50.80 11/06/2014 1051   CHOLHDL 4 11/06/2014 1051   VLDL 31.0 11/06/2014 1051   LDLCALC 114* 11/06/2014 1051   LDLDIRECT 144.4 02/12/2011 1018       Wt Readings from Last 3 Encounters:  10/24/15 192 lb (87.091 kg)  03/29/15 189 lb (85.73 kg)  11/20/14 186 lb (84.369 kg)         ASSESSMENT AND PLAN:  1. ischemic heart disease.Stable, no recent angina.  No recent sublingual nitroglycerin. 2. hypertensive heart disease without heart failure.The pressure stable on current therapy 3. spina bifida and scoliosis 4. Hypercholesterolemia.Followed by PCP 5. irritable bowel syndromeFollowed by Dr. Paulita Fujita. 6. osteopenia 7. Nocturia 3 and poor sleep habits  Current medicines are reviewed at length with the patient today.  The patient does not have concerns regarding medicines.  The following changes have been made:  no change  Labs/ tests ordered today include:  No orders of the defined types were placed in this encounter.     Disposition:  The patient will return in 6 months for follow-up office visit and EKG with Dr. Quay Burow.  Dr. Gwenlyn Found is her husband's Cardiologist.  Signed, Darlin Coco MD 10/24/2015 2:58 PM    [facility] [place], [place], [place]  [address] Phone: [phone]; Fax: [phone]

## 2015-11-07 ENCOUNTER — Other Ambulatory Visit: Payer: Self-pay | Admitting: Cardiology

## 2015-11-07 ENCOUNTER — Ambulatory Visit: Payer: Medicare Other | Admitting: Podiatry

## 2015-11-12 ENCOUNTER — Other Ambulatory Visit: Payer: Self-pay | Admitting: Cardiology

## 2015-11-16 ENCOUNTER — Other Ambulatory Visit: Payer: Self-pay | Admitting: Cardiology

## 2015-11-16 NOTE — Telephone Encounter (Signed)
Should this just be once daily? Please advise. Thanks, MI

## 2015-11-16 NOTE — Telephone Encounter (Signed)
Spoke with patient and confirmed sig on Amlodipine

## 2015-11-19 ENCOUNTER — Telehealth: Payer: Self-pay | Admitting: *Deleted

## 2015-11-20 NOTE — Telephone Encounter (Signed)
Requesting Meclizine refill, advised patient needed to get from PCP Verbalized understanding

## 2015-11-24 ENCOUNTER — Other Ambulatory Visit: Payer: Self-pay | Admitting: Cardiology

## 2015-12-05 ENCOUNTER — Ambulatory Visit (INDEPENDENT_AMBULATORY_CARE_PROVIDER_SITE_OTHER): Payer: Medicare Other | Admitting: Podiatry

## 2015-12-05 ENCOUNTER — Encounter: Payer: Self-pay | Admitting: Podiatry

## 2015-12-05 VITALS — BP 121/64 | HR 66 | Resp 12

## 2015-12-05 DIAGNOSIS — M79673 Pain in unspecified foot: Secondary | ICD-10-CM | POA: Diagnosis not present

## 2015-12-05 DIAGNOSIS — B351 Tinea unguium: Secondary | ICD-10-CM | POA: Diagnosis not present

## 2015-12-05 DIAGNOSIS — I259 Chronic ischemic heart disease, unspecified: Secondary | ICD-10-CM | POA: Diagnosis not present

## 2015-12-05 NOTE — Progress Notes (Signed)
   Subjective:    Patient ID: Vanessa Nielsen, female    DOB: June 12, 1934, 82 y.o.   MRN: [id number]  HPI   This patient presents today complaining of a thickened second left toenail with gradual increase of thickness and deformity and around the nail. Patient is attempted to trim the nail, however, is not able to do so yourself and is requesting debridement of the nail. Patient has a history of neuropathy and deformity affecting her left lower extremity associated with spina bifida  Review of Systems  Constitutional: Positive for activity change.  Respiratory: Positive for cough.   Gastrointestinal: Positive for nausea, diarrhea and constipation.  Musculoskeletal: Positive for myalgias, back pain and gait problem.       Objective:   Physical Exam  Orientated 3 patient presents with her husband treatment room  Vascular: DP pulses 2/4 right 1/4 left PT pulses 1/4 bilaterally Capillary reflex immediate bilaterally  Neurological: Sensation to 10 g monofilament wire intact 2/5 right 0/5 left Vibratory sensation nonreactive bilaterally Ankle reflexes reactive right trace reactive left  Dermatological: No open skin lesions bilaterally The second left toenails hypertrophic and deformed The remaining toenails are mildly elongated with occasional texture and color changes  Musculoskeletal: Right lower extremity longer than left Left foot smaller than right Patient is able to transfer from roller walker assistance to treatment chair Patient able to dorsi and plantar flex left foot 3/5 left 5/5 right      Assessment & Plan:   Assessment: Diminished pedal pulses without open lesions Sensorimotor neuropathy associated most likely with spina bifida Leg length inequality Gait disturbance Mycotic deformed second left toenail  Plan: Today I reviewed the results examination today and offered patient debridement of the second left toenail. She was advised to nail would continue  to remain deformed and hypertrophic and would need occasional debridement. Patient verbally consents  The second left toenail was debrided mechanically and legs without any bleeding  Reappoint at patient's request

## 2015-12-05 NOTE — Patient Instructions (Signed)
Return as needed for debridement of the thickened skin second left toenail

## 2016-01-21 DIAGNOSIS — R110 Nausea: Secondary | ICD-10-CM | POA: Diagnosis not present

## 2016-01-21 DIAGNOSIS — K5900 Constipation, unspecified: Secondary | ICD-10-CM | POA: Diagnosis not present

## 2016-01-21 DIAGNOSIS — Z86010 Personal history of colonic polyps: Secondary | ICD-10-CM | POA: Diagnosis not present

## 2016-02-04 ENCOUNTER — Telehealth: Payer: Self-pay | Admitting: Cardiology

## 2016-02-04 NOTE — Telephone Encounter (Signed)
Pt is calling in to speak with you about her medications. She would not elaborate on what the complaint/concern was , she just asked to speak with you. Please f/u with her  Thanks

## 2016-02-05 NOTE — Telephone Encounter (Signed)
Left message to call back  

## 2016-02-05 NOTE — Telephone Encounter (Signed)
F/u  Pt returning RN phone call. Please call back and discuss.   

## 2016-02-05 NOTE — Telephone Encounter (Signed)
New  Message ° °Pt returned call  °

## 2016-02-06 DIAGNOSIS — R350 Frequency of micturition: Secondary | ICD-10-CM | POA: Diagnosis not present

## 2016-02-08 NOTE — Telephone Encounter (Signed)
Tried to call, line busy

## 2016-02-19 NOTE — Telephone Encounter (Signed)
Followed up with patient and she was just wanting to know who  Dr. Mare Ferrari would recommend her seeing for follow up Patient did not know if she preferred the [facility] or [facility] office but preferred not seeing Dr Gwenlyn Found since he was her husbands doctor Gave her the name of Dr Debara Pickett and Dr Oval Linsey at the [place] office and Dr Marlou Porch at [facility] Patient will think it over and call back to schedule ov

## 2016-02-20 DIAGNOSIS — M7052 Other bursitis of knee, left knee: Secondary | ICD-10-CM | POA: Diagnosis not present

## 2016-03-06 ENCOUNTER — Telehealth: Payer: Self-pay | Admitting: Cardiovascular Disease

## 2016-03-06 ENCOUNTER — Other Ambulatory Visit: Payer: Self-pay | Admitting: Cardiovascular Disease

## 2016-03-06 NOTE — Telephone Encounter (Signed)
Returned call to patient and advised that she should contact her PCP for refills of meclizine as this is a non-cardiac med previously Rx'ed by Dr. Mare Ferrari. She voiced understanding.

## 2016-03-06 NOTE — Telephone Encounter (Signed)
Message routed to Dr. Gwenlyn Found to see if he will refill MECLIZINE (or defer to PCP) - former Mare Ferrari, MD patient Recall to see Dr. Gwenlyn Found in August 2017

## 2016-03-06 NOTE — Telephone Encounter (Signed)
Rx(s) sent to pharmacy electronically.  

## 2016-03-06 NOTE — Telephone Encounter (Signed)
°*  STAT* If patient is at the pharmacy, call can be transferred to refill team.   1. Which medications need to be refilled? (please list name of each medication and dose if known) Meclizine25mg   2. Which pharmacy/location (including street and city if local pharmacy) is medication to be sent to?[facility] on [address]   3. Do they need a 30 day or 90 day supply? 61  Former [facility] patient ( will see Dr. Gwenlyn Found in 04/2016)

## 2016-03-06 NOTE — Telephone Encounter (Signed)
Deferred to PCP

## 2016-03-12 IMAGING — CR DG ABDOMEN ACUTE W/ 1V CHEST
3 series · 3 of 3 positions shown · non-contrast
Comparison: PA and lateral chest 08/09/2012. CT abdomen and pelvis
07/09/2011.

CLINICAL DATA: Bloating and vomiting.

EXAM:
ACUTE ABDOMEN SERIES (ABDOMEN 2 VIEW & CHEST 1 VIEW)

[w abdomen decub]
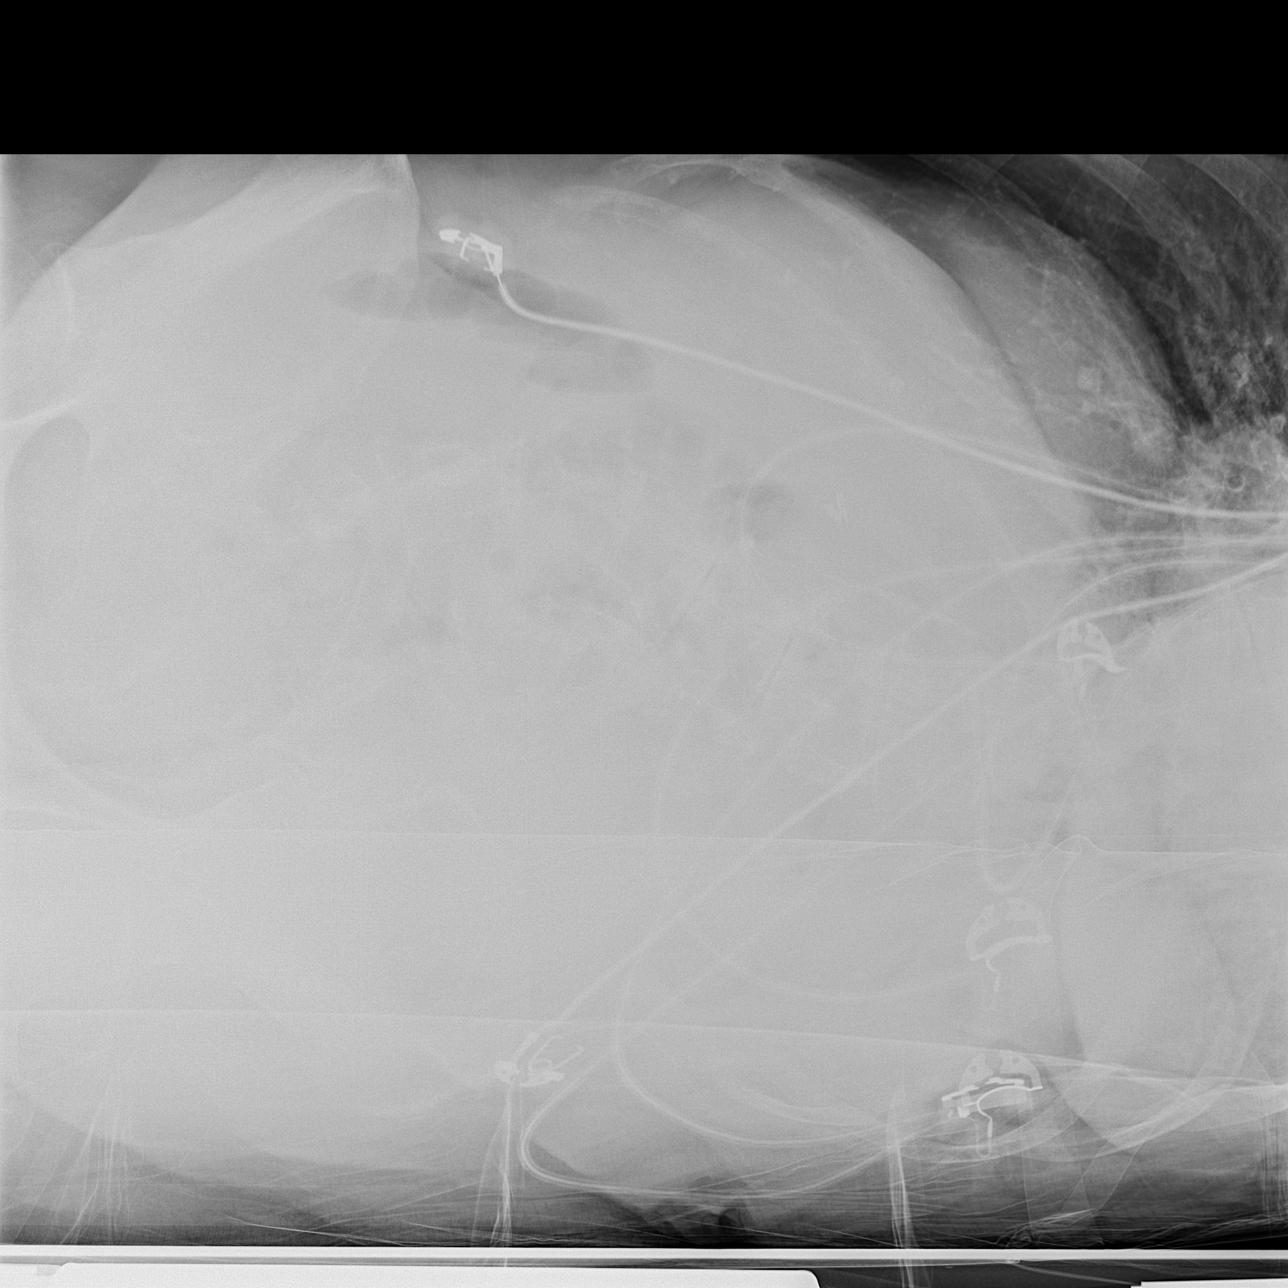

[x abdomen supine]
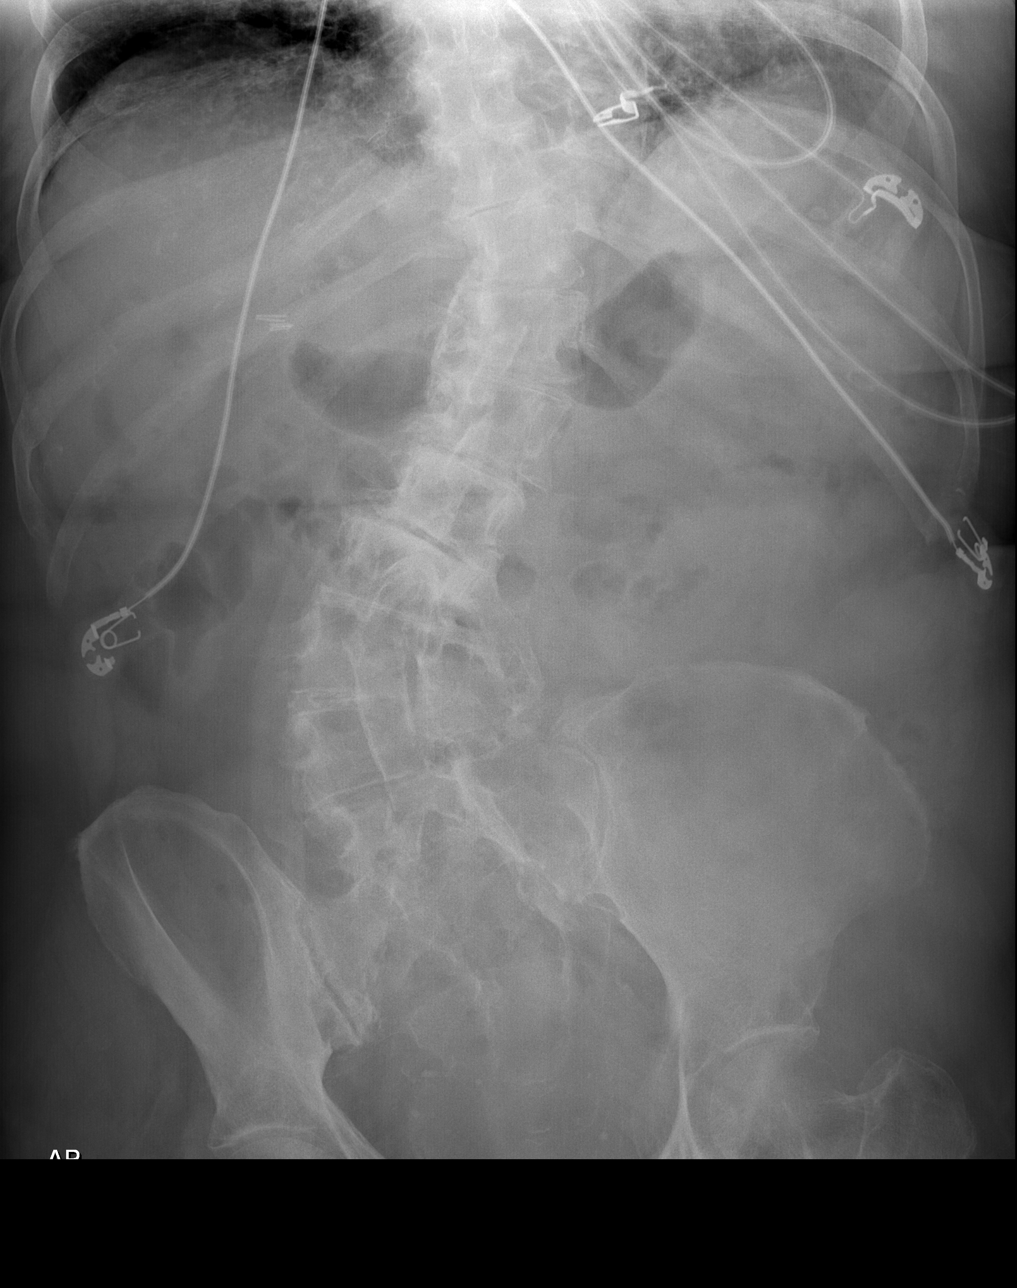

[x chest ap]
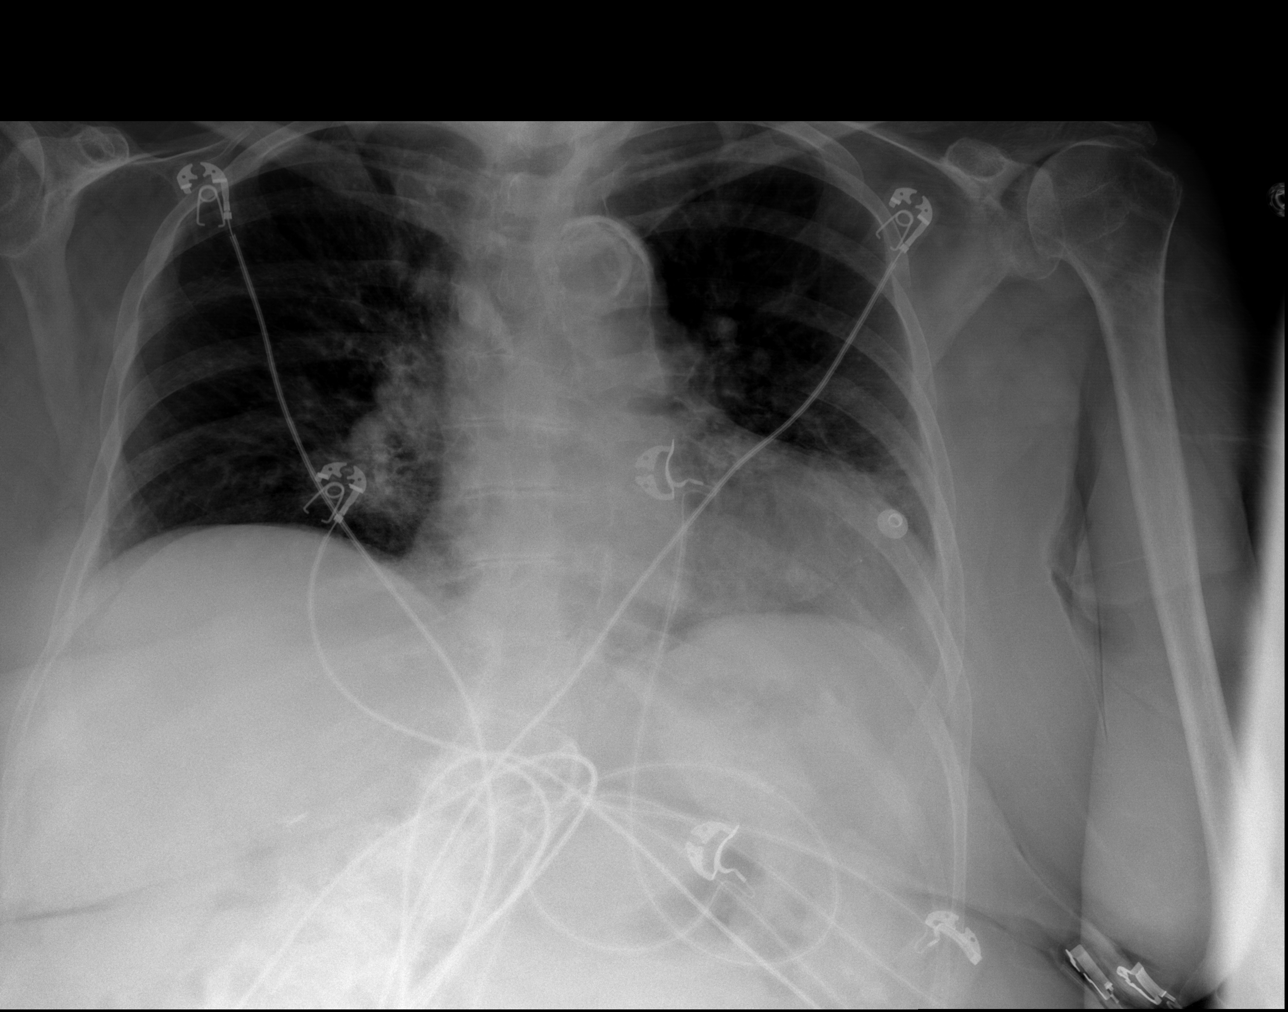

[3 of 3 positions shown; findings below may reference images not displayed]

FINDINGS: Single view of the chest demonstrates low lung volumes. There is
some mild scarring in the left base, unchanged. Lungs otherwise
clear. No pneumothorax or pleural effusion.

Two views of the abdomen show no free intraperitoneal air. The bowel
gas pattern is unremarkable. Severe thoracolumbar scoliosis is
noted.
IMPRESSION: No acute finding chest or abdomen.

## 2016-03-27 DIAGNOSIS — K5901 Slow transit constipation: Secondary | ICD-10-CM | POA: Diagnosis not present

## 2016-04-10 IMAGING — CR DG ABDOMEN 1V
2 series · 2 of 2 positions shown · non-contrast
Comparison: 01/17/2014

CLINICAL DATA: The constipation.  Ingested Sitz markers 3 days ago.

EXAM:
ABDOMEN - 1 VIEW

[t abdomen supine (1 of 2)]
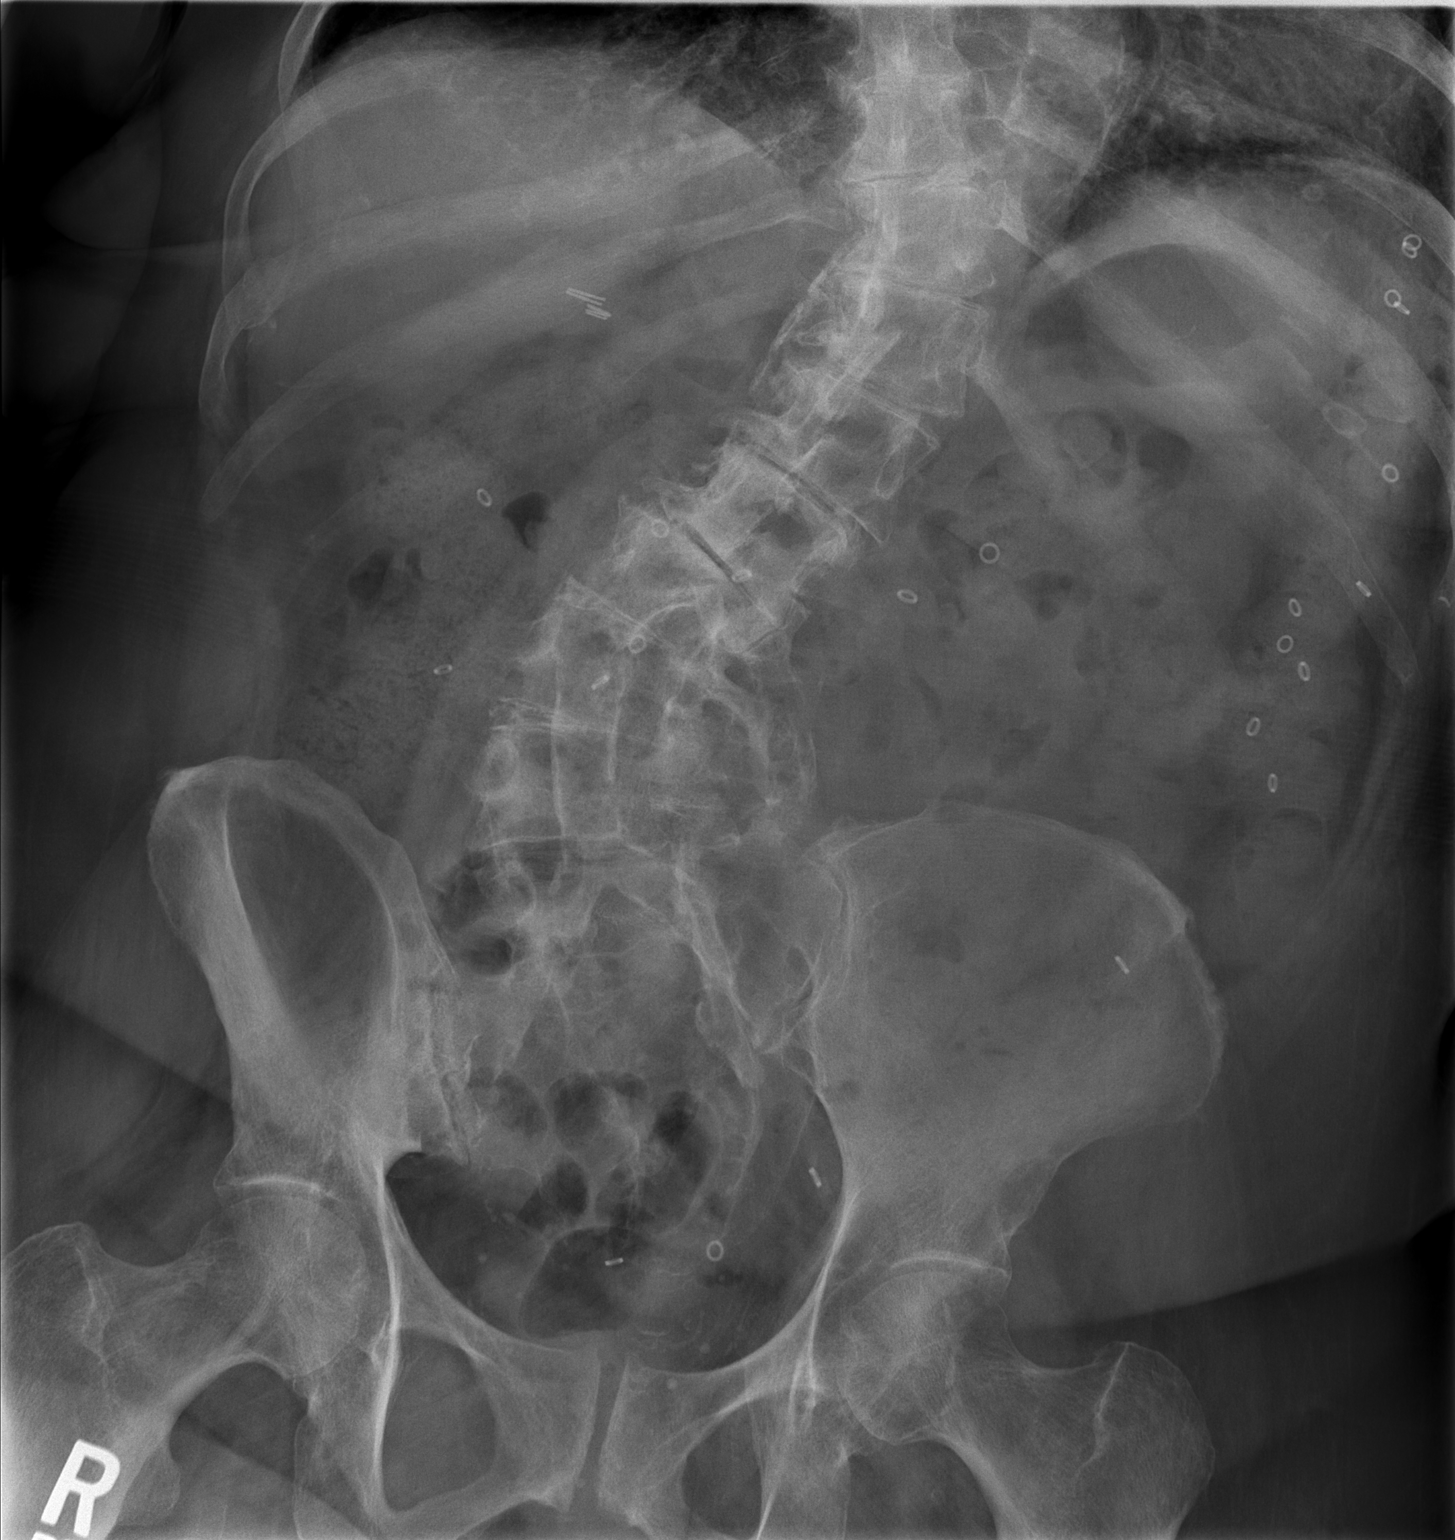

[t abdomen supine (2 of 2)]
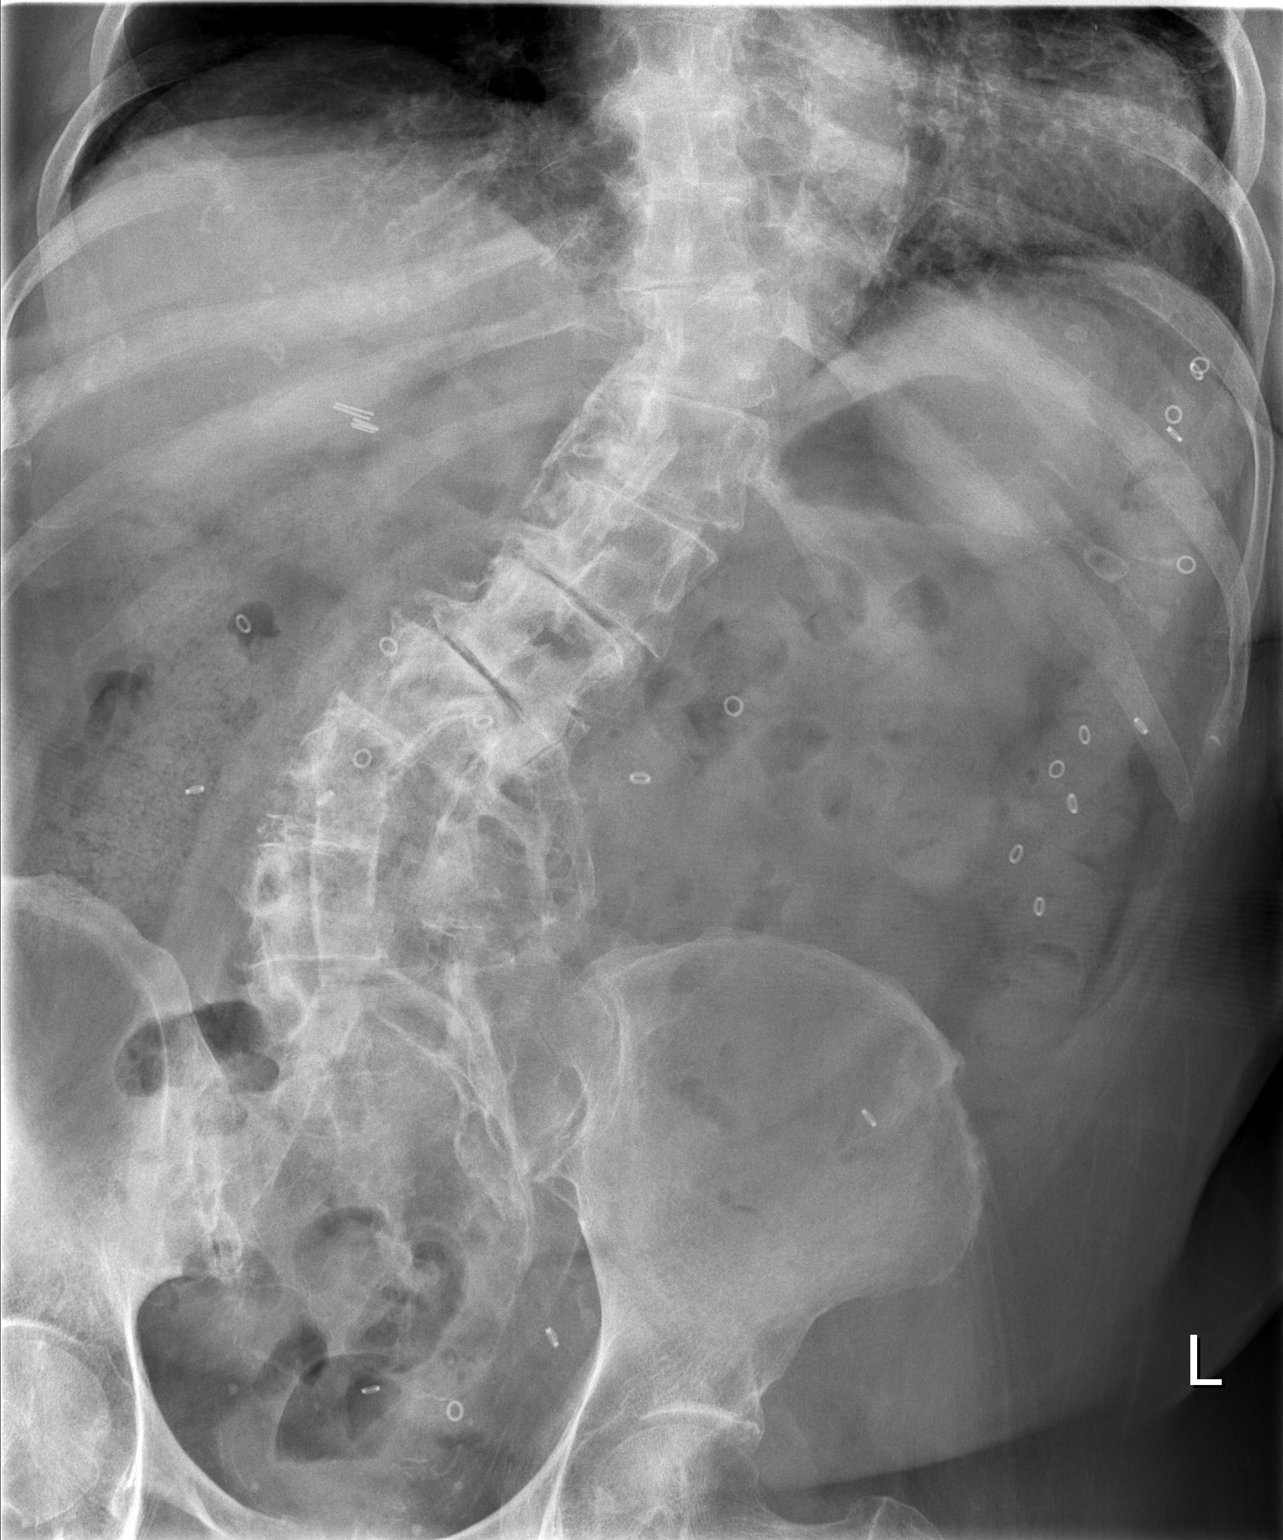

[2 of 2 positions shown; findings below may reference images not displayed]

FINDINGS: There are a total of 23 Sitz markers extending from the hepatic
flexure to the rectum with most from the splenic flexure distally.
These have progressed since the prior exam. There is mild fecal
retention throughout the colon. Remainder of the exam is unchanged.
IMPRESSION: Total of 23 Sitz markers over the transverse, descending and
rectosigmoid colon demonstrating continued migration since the prior
exam.

## 2016-04-16 DIAGNOSIS — E559 Vitamin D deficiency, unspecified: Secondary | ICD-10-CM | POA: Diagnosis not present

## 2016-04-16 DIAGNOSIS — E784 Other hyperlipidemia: Secondary | ICD-10-CM | POA: Diagnosis not present

## 2016-04-29 DIAGNOSIS — Z6839 Body mass index (BMI) 39.0-39.9, adult: Secondary | ICD-10-CM | POA: Diagnosis not present

## 2016-04-29 DIAGNOSIS — E784 Other hyperlipidemia: Secondary | ICD-10-CM | POA: Diagnosis not present

## 2016-04-29 DIAGNOSIS — K529 Noninfective gastroenteritis and colitis, unspecified: Secondary | ICD-10-CM | POA: Diagnosis not present

## 2016-04-29 DIAGNOSIS — M859 Disorder of bone density and structure, unspecified: Secondary | ICD-10-CM | POA: Diagnosis not present

## 2016-04-29 DIAGNOSIS — E559 Vitamin D deficiency, unspecified: Secondary | ICD-10-CM | POA: Diagnosis not present

## 2016-04-29 DIAGNOSIS — I25728 Atherosclerosis of autologous artery coronary artery bypass graft(s) with other forms of angina pectoris: Secondary | ICD-10-CM | POA: Diagnosis not present

## 2016-04-29 DIAGNOSIS — Z0000 Encounter for general adult medical examination without abnormal findings: Secondary | ICD-10-CM | POA: Diagnosis not present

## 2016-04-29 DIAGNOSIS — I10 Essential (primary) hypertension: Secondary | ICD-10-CM | POA: Diagnosis not present

## 2016-04-29 DIAGNOSIS — Z1389 Encounter for screening for other disorder: Secondary | ICD-10-CM | POA: Diagnosis not present

## 2016-05-27 ENCOUNTER — Other Ambulatory Visit: Payer: Self-pay | Admitting: *Deleted

## 2016-05-27 MED ORDER — AMLODIPINE BESYLATE 5 MG PO TABS: ORAL_TABLET | ORAL | 0 refills | Status: DC

## 2016-06-05 ENCOUNTER — Ambulatory Visit (INDEPENDENT_AMBULATORY_CARE_PROVIDER_SITE_OTHER): Payer: Medicare Other | Admitting: Cardiovascular Disease

## 2016-06-05 ENCOUNTER — Encounter: Payer: Self-pay | Admitting: Cardiovascular Disease

## 2016-06-05 VITALS — BP 113/64 | HR 71 | Ht 60.0 in | Wt 188.2 lb

## 2016-06-05 DIAGNOSIS — E785 Hyperlipidemia, unspecified: Secondary | ICD-10-CM | POA: Diagnosis not present

## 2016-06-05 DIAGNOSIS — I119 Hypertensive heart disease without heart failure: Secondary | ICD-10-CM

## 2016-06-05 DIAGNOSIS — K589 Irritable bowel syndrome without diarrhea: Secondary | ICD-10-CM

## 2016-06-05 DIAGNOSIS — I259 Chronic ischemic heart disease, unspecified: Secondary | ICD-10-CM

## 2016-06-05 NOTE — Progress Notes (Signed)
Cardiology Office Note   Date:  06/05/2016   ID:  Vanessa Nielsen, DOB 12-03-1933, MRN [id number]  PCP:  Velna Hatchet, MD  Cardiologist:   Skeet Latch, MD   Chief Complaint  Patient presents with  . Advice Only    here to re establish care.     History of Present Illness: Vanessa Nielsen is a 82 y.o. female with hypertension, hyperlipidemia, CAD s/p CABG (LIMA-->LAD 2008), Spina bifida, who presents for follow up. Vanessa Nielsen had a nuclear stress test in 2012 and a Lexiscan Myoview 11/21/14 that were negative for ischemia.  LVEF was 62%.  She hasn't noted any chest pain, shortness of breath, lower extremity edema, orthopnea or PND.  She hasn't been able to walk in 10 years due to back pain and leg pain.  She hopes to get a back brace and start physical therapy so that she can hopefully try to walk again.  Her Main complaint at this time his diarrhea. This is been ongoing for 10-12 years.  She reports episodes of constipation as well as explosive diarrhea. This has made it very difficult for her to travel or go far from her home.  She was previously seen by a gastroenterologist  at [facility] and transferred her care to Dr. Paulita Fujita.  She is currently in search of an additional opinion.  She tried Linzess without success.  Past Medical History:  Diagnosis Date  . Allergic rhinitis   . Coronary artery disease    2v CABG @ [facility]  . Hypercholesteremia   . Hyperlipidemia   . Hypertension   . Iatrogenic Cushing's disease ([place])   . Multiple allergies    induced by pain: swelling in UE/face/torso, nausea, hives followed by hypotension unless tx with high dose steroids  . Osteoarthritis   . Scoliosis   . Spina bifida     Past Surgical History:  Procedure Laterality Date  . ABDOMINAL HYSTERECTOMY  1981 or 82  . ANAL RECTAL MANOMETRY N/A 02/13/2014   Procedure: ANAL RECTAL MANOMETRY;  Surgeon: Arta Silence, MD;  Location: [facility];  Service: Endoscopy;  Laterality: N/A;    . APPENDECTOMY  1960  . CARDIAC CATHETERIZATION  11/2006  . CARDIOVASCULAR STRESS TEST  09/19/2010  . CHOLECYSTECTOMY  2011  . CHOLECYSTECTOMY, LAPAROSCOPIC  05/27/2010  . CORONARY ANGIOPLASTY  11/2006   left anterior descending vessel  . Hardware removed in spine due to loos screws  1997  . Surgery for scoliosis  1995  . TONSILLECTOMY  1943  . TOTAL KNEE ARTHROPLASTY  09/2010   right     Current Outpatient Prescriptions  Medication Sig Dispense Refill  . AMITIZA 8 MCG capsule Take 8 mcg by mouth daily as needed for constipation. 1 tab daily occ    . amLODipine (NORVASC) 5 MG tablet Take 1 tablet by mouth if systolic blood pressure above 150 30 tablet 0  . aspirin 81 MG EC tablet Take 81 mg by mouth daily.      Marland Kitchen atorvastatin (LIPITOR) 40 MG tablet TAKE 1 TABLET BY MOUTH EVERY DAY 90 tablet 1  . diazepam (VALIUM) 5 MG tablet Take 5 mg by mouth at bedtime as needed (sleep).    . docusate sodium (COLACE) 100 MG capsule Take 100 mg by mouth 2 (two) times daily.     Marland Kitchen esomeprazole (NEXIUM) 40 MG capsule Take 40 mg by mouth at bedtime.     . fluticasone (FLONASE) 50 MCG/ACT nasal spray USE 2 SPRAYS INTO  EACH NOSTRIL EVERY DAY 16 g 3  . lidocaine (LIDODERM) 5 % Place 1 patch onto the skin daily. Remove & Discard patch within 12 hours or as directed by MD    . Rolan Lipa 290 MCG CAPS capsule Take 290 mcg by mouth daily.  1  . Loperamide HCl (IMODIUM A-D PO) Take 1 tablet by mouth daily as needed (loose stool). As needed    . losartan-hydrochlorothiazide (HYZAAR) 100-25 MG tablet Take 1 tablet by mouth daily.    . meclizine (ANTIVERT) 25 MG tablet TAKE 1 TABLET BY MOUTH 3 TIMES A DAY AS NEEDED 90 tablet 2  . metoprolol (LOPRESSOR) 50 MG tablet Take 0.5 tablets (25 mg total) by mouth 2 (two) times daily. 30 tablet 9  . nitroGLYCERIN (NITROSTAT) 0.4 MG SL tablet Place 0.4 mg under the tongue every 5 (five) minutes as needed for chest pain.    Marland Kitchen ondansetron (ZOFRAN) 8 MG tablet Take 8 mg by mouth every  8 (eight) hours as needed for nausea or vomiting.     . polyethylene glycol (MIRALAX / GLYCOLAX) packet Take 17 g by mouth 2 (two) times daily.    . potassium chloride (MICRO-K) 10 MEQ CR capsule TAKE ONE CAPSULE BY MOUTH TWICE A DAY 180 capsule 3  . predniSONE (DELTASONE) 20 MG tablet Take 20 mg by mouth 2 (two) times daily as needed (FOR BACK PAIN).   0  . Probiotic Product (ALIGN PO) Take 1 tablet by mouth daily.     . promethazine (PHENERGAN) 25 MG tablet Take 25 mg by mouth every 6 (six) hours as needed for nausea or vomiting.    . simethicone (MYLICON) 80 MG chewable tablet Chew 80 mg by mouth every 6 (six) hours as needed for flatulence. Gas x as needed     No current facility-administered medications for this visit.     Allergies:   Cardura [doxazosin mesylate]; Clarithromycin; Erythromycin; Guaifenesin; and Ketorolac tromethamine    Social History:  The patient  reports that she has never smoked. She does not have any smokeless tobacco history on file. She reports that she does not drink alcohol.   Family History:  The patient's family history includes Arthritis in her father and mother; Cancer (age of onset: 60) in her father; Heart disease in her other; Hypertension in her maternal aunt and other.    ROS:  Please see the history of present illness.   Otherwise, review of systems are positive for none.   All other systems are reviewed and negative.    PHYSICAL EXAM: VS:  BP 113/64   Pulse 71   Ht 5' (1.524 m)   Wt 188 lb 3.2 oz (85.4 kg)   BMI 36.76 kg/m  , BMI Body mass index is 36.76 kg/m. GENERAL:  Well appearing HEENT:  Pupils equal round and reactive, fundi not visualized, oral mucosa unremarkable NECK:  No jugular venous distention, waveform within normal limits, carotid upstroke brisk and symmetric, no bruits, no thyromegaly LYMPHATICS:  No cervical adenopathy LUNGS:  Clear to auscultation bilaterally HEART:  RRR.  PMI not displaced or sustained,S1 and S2 within  normal limits, no S3, no S4, no clicks, no rubs, no murmurs ABD:  Flat, positive bowel sounds normal in frequency in pitch, no bruits, no rebound, no guarding, no midline pulsatile mass, no hepatomegaly, no splenomegaly EXT:  2 plus pulses throughout, no edema, no cyanosis no clubbing SKIN:  No rashes no nodules NEURO:  Cranial nerves II through XII grossly intact, motor  grossly intact throughout [facility]:  Cognitively intact, oriented to person place and time    EKG:  EKG is ordered today. The ekg ordered today demonstrates sinus rhythm rate 71 bpm.   Recent Labs: No results found for requested labs within last 8760 hours.    Lipid Panel    Component Value Date/Time   CHOL 196 11/06/2014 1051   TRIG 155.0 (H) 11/06/2014 1051   HDL 50.80 11/06/2014 1051   CHOLHDL 4 11/06/2014 1051   VLDL 31.0 11/06/2014 1051   LDLCALC 114 (H) 11/06/2014 1051   LDLDIRECT 144.4 02/12/2011 1018      Wt Readings from Last 3 Encounters:  06/05/16 188 lb 3.2 oz (85.4 kg)  10/24/15 192 lb (87.1 kg)  03/29/15 189 lb (85.7 kg)      ASSESSMENT AND PLAN:  # CAD s/p CABG: Vanessa Nielsen is doing well from a cardiovascular standpoint. She has no chest pain or shortness of breath. She is not very physically active. She is hopeful that she may be able to walk again soon with the help of a back brace and some physical therapy. I think that this would be an excellent idea for her heart. Her blood pressure is well-controlled.  Continue aspirin, atorvastatin, and metoprolol.  # Hypertension: BP at goal on metoprolol amlodipine, losartan and hydrochlorothiazide.  # Hyperlipidemia: Continue atorvastatin. We'll check lipids at follow-up.   # IBS: Vanessa Nielsen is suffering from IBS and has difficulty leaving home due to diarrhea.  We will refer her to Dr. Zenovia Jarred for a second opinion.    Current medicines are reviewed at length with the patient today.  The patient does not have concerns regarding  medicines.  The following changes have been made:  no change  Labs/ tests ordered today include:   Orders Placed This Encounter  Procedures  . Ambulatory referral to Gastroenterology  . EKG 12-Lead     Disposition:   FU with Luchiano Viscomi C. Oval Linsey, MD, [facility] in 6 months.    This note was written with the assistance of speech recognition software.  Please excuse any transcriptional errors.  Signed, Jonita Hirota C. Oval Linsey, MD, [facility]  06/05/2016 5:00 PM    [place]

## 2016-06-05 NOTE — Patient Instructions (Addendum)
Medication Instructions:  Your physician recommends that you continue on your current medications as directed. Please refer to the Current Medication list given to you today.  Labwork: none  Testing/Procedures: none  Follow-Up: Your physician wants you to follow-up in: 6 month ov You will receive a reminder letter in the mail two months in advance. If you don't receive a letter, please call our office to schedule the follow-up appointment.  Any Other Special Instructions Will Be Listed Below (If Applicable).  You have been referred to Dr Hilarie Fredrickson at [place]. If you do not hear from the office you may call them directly at [phone]  If you need a refill on your cardiac medications before your next appointment, please call your pharmacy.

## 2016-06-09 ENCOUNTER — Telehealth: Payer: Self-pay | Admitting: *Deleted

## 2016-06-09 NOTE — Telephone Encounter (Signed)
-----   Message from Audry Riles sent at 06/09/2016  8:04 AM EDT ----- Kermit Balo Morning Dottie,  This patient was declined by Dr.Stark in 2014 and here is the referral information. Referral for  IBS (irritable bowel syndrome).  Hello Lorriane Shire, That was 3 years ago and patient has difficulty ambulating. The request was for her to see Dr Hilarie Fredrickson, could he possibly review? Thanks,  Rip Harbour   ----- Message ----- From: Audry Riles Sent: 06/06/2016   8:32 AM To: [place],  This referral has come into our workqueue. Patient has been declined to schedule by Dr.Stark per note 05/31/13.   Thank you, Lorriane Shire

## 2016-06-09 NOTE — Telephone Encounter (Signed)
Dr Hilarie Fredrickson- Patient is referred to [place] for IBS. She has history with Dr Watt Climes and extensive history with Dr Derrill Kay at [facility] (see care everywhere). Do you want to accept patient?

## 2016-06-10 NOTE — Telephone Encounter (Signed)
I am not sure that I will have much additional recommendations  She has lifelong constipation and now it seems the complaint is diarrhea I would like the [facility] records to review I am willing to see her in a new patient 30 min slot, with the knowledge that I may recommend she return to Dr. Derrill Kay as [facility] if tertiary care felt needed

## 2016-06-19 DIAGNOSIS — C44319 Basal cell carcinoma of skin of other parts of face: Secondary | ICD-10-CM | POA: Diagnosis not present

## 2016-06-19 DIAGNOSIS — L821 Other seborrheic keratosis: Secondary | ICD-10-CM | POA: Diagnosis not present

## 2016-06-19 DIAGNOSIS — D485 Neoplasm of uncertain behavior of skin: Secondary | ICD-10-CM | POA: Diagnosis not present

## 2016-06-19 DIAGNOSIS — Z85828 Personal history of other malignant neoplasm of skin: Secondary | ICD-10-CM | POA: Diagnosis not present

## 2016-06-19 DIAGNOSIS — L309 Dermatitis, unspecified: Secondary | ICD-10-CM | POA: Diagnosis not present

## 2016-06-23 ENCOUNTER — Other Ambulatory Visit: Payer: Self-pay | Admitting: Cardiovascular Disease

## 2016-06-23 NOTE — Telephone Encounter (Signed)
Please review for refill. Thanks!  

## 2016-07-09 DIAGNOSIS — Z85828 Personal history of other malignant neoplasm of skin: Secondary | ICD-10-CM | POA: Diagnosis not present

## 2016-07-09 DIAGNOSIS — C44319 Basal cell carcinoma of skin of other parts of face: Secondary | ICD-10-CM | POA: Diagnosis not present

## 2016-07-23 DIAGNOSIS — M545 Low back pain: Secondary | ICD-10-CM | POA: Diagnosis not present

## 2016-07-30 DIAGNOSIS — Z23 Encounter for immunization: Secondary | ICD-10-CM | POA: Diagnosis not present

## 2016-07-31 DIAGNOSIS — M8589 Other specified disorders of bone density and structure, multiple sites: Secondary | ICD-10-CM | POA: Diagnosis not present

## 2016-08-04 ENCOUNTER — Telehealth: Payer: Self-pay | Admitting: *Deleted

## 2016-08-04 NOTE — Telephone Encounter (Signed)
Spoke with patient and advised she needs to obtain her previous GI records for appointment to be made with [facility]

## 2016-08-05 ENCOUNTER — Other Ambulatory Visit: Payer: Self-pay | Admitting: *Deleted

## 2016-08-05 MED ORDER — ATORVASTATIN CALCIUM 40 MG PO TABS: 40.0000 mg | ORAL_TABLET | Freq: Every day | ORAL | 3 refills | Status: DC

## 2016-08-05 NOTE — Telephone Encounter (Signed)
E SENT TO PHARMACY 

## 2016-08-11 DIAGNOSIS — Z85828 Personal history of other malignant neoplasm of skin: Secondary | ICD-10-CM | POA: Diagnosis not present

## 2016-08-11 DIAGNOSIS — L853 Xerosis cutis: Secondary | ICD-10-CM | POA: Diagnosis not present

## 2016-08-11 DIAGNOSIS — L821 Other seborrheic keratosis: Secondary | ICD-10-CM | POA: Diagnosis not present

## 2016-08-15 ENCOUNTER — Other Ambulatory Visit: Payer: Self-pay

## 2016-08-15 MED ORDER — METOPROLOL TARTRATE 50 MG PO TABS: 25.0000 mg | ORAL_TABLET | Freq: Two times a day (BID) | ORAL | 9 refills | Status: DC

## 2016-09-17 ENCOUNTER — Other Ambulatory Visit: Payer: Self-pay

## 2016-09-17 MED ORDER — AMLODIPINE BESYLATE 5 MG PO TABS: 5.0000 mg | ORAL_TABLET | Freq: Every day | ORAL | 0 refills | Status: DC

## 2016-09-22 DIAGNOSIS — M419 Scoliosis, unspecified: Secondary | ICD-10-CM | POA: Diagnosis not present

## 2016-09-22 DIAGNOSIS — K580 Irritable bowel syndrome with diarrhea: Secondary | ICD-10-CM | POA: Diagnosis not present

## 2016-09-22 DIAGNOSIS — M1990 Unspecified osteoarthritis, unspecified site: Secondary | ICD-10-CM | POA: Diagnosis not present

## 2016-09-22 DIAGNOSIS — I2510 Atherosclerotic heart disease of native coronary artery without angina pectoris: Secondary | ICD-10-CM | POA: Diagnosis not present

## 2016-09-22 DIAGNOSIS — I10 Essential (primary) hypertension: Secondary | ICD-10-CM | POA: Diagnosis not present

## 2016-09-22 DIAGNOSIS — Q059 Spina bifida, unspecified: Secondary | ICD-10-CM | POA: Diagnosis not present

## 2016-09-24 DIAGNOSIS — K580 Irritable bowel syndrome with diarrhea: Secondary | ICD-10-CM | POA: Diagnosis not present

## 2016-09-24 DIAGNOSIS — I10 Essential (primary) hypertension: Secondary | ICD-10-CM | POA: Diagnosis not present

## 2016-09-24 DIAGNOSIS — M1990 Unspecified osteoarthritis, unspecified site: Secondary | ICD-10-CM | POA: Diagnosis not present

## 2016-09-24 DIAGNOSIS — Q059 Spina bifida, unspecified: Secondary | ICD-10-CM | POA: Diagnosis not present

## 2016-09-24 DIAGNOSIS — I2510 Atherosclerotic heart disease of native coronary artery without angina pectoris: Secondary | ICD-10-CM | POA: Diagnosis not present

## 2016-09-24 DIAGNOSIS — M419 Scoliosis, unspecified: Secondary | ICD-10-CM | POA: Diagnosis not present

## 2016-09-25 DIAGNOSIS — M1990 Unspecified osteoarthritis, unspecified site: Secondary | ICD-10-CM | POA: Diagnosis not present

## 2016-09-25 DIAGNOSIS — Q059 Spina bifida, unspecified: Secondary | ICD-10-CM | POA: Diagnosis not present

## 2016-09-25 DIAGNOSIS — I10 Essential (primary) hypertension: Secondary | ICD-10-CM | POA: Diagnosis not present

## 2016-09-25 DIAGNOSIS — I2510 Atherosclerotic heart disease of native coronary artery without angina pectoris: Secondary | ICD-10-CM | POA: Diagnosis not present

## 2016-09-25 DIAGNOSIS — K580 Irritable bowel syndrome with diarrhea: Secondary | ICD-10-CM | POA: Diagnosis not present

## 2016-09-25 DIAGNOSIS — M419 Scoliosis, unspecified: Secondary | ICD-10-CM | POA: Diagnosis not present

## 2016-10-08 ENCOUNTER — Other Ambulatory Visit: Payer: Self-pay | Admitting: Internal Medicine

## 2016-10-08 DIAGNOSIS — Z1231 Encounter for screening mammogram for malignant neoplasm of breast: Secondary | ICD-10-CM

## 2016-10-11 DIAGNOSIS — R1084 Generalized abdominal pain: Secondary | ICD-10-CM | POA: Diagnosis not present

## 2016-10-16 ENCOUNTER — Other Ambulatory Visit: Payer: Self-pay

## 2016-10-16 MED ORDER — LOSARTAN POTASSIUM-HCTZ 100-25 MG PO TABS: 1.0000 | ORAL_TABLET | Freq: Every day | ORAL | 2 refills | Status: DC

## 2016-10-22 ENCOUNTER — Ambulatory Visit
Admission: RE | Admit: 2016-10-22 | Discharge: 2016-10-22 | Disposition: A | Discharge status: Home / Self Care | Payer: Medicare Other | Source: Ambulatory Visit | Attending: Internal Medicine | Admitting: Internal Medicine

## 2016-10-22 DIAGNOSIS — Z1231 Encounter for screening mammogram for malignant neoplasm of breast: Secondary | ICD-10-CM

## 2016-10-31 ENCOUNTER — Other Ambulatory Visit: Payer: Self-pay | Admitting: *Deleted

## 2016-10-31 MED ORDER — POTASSIUM CHLORIDE ER 10 MEQ PO CPCR: 10.0000 meq | ORAL_CAPSULE | Freq: Two times a day (BID) | ORAL | 0 refills | Status: DC

## 2016-10-31 NOTE — Telephone Encounter (Signed)
REFILL 

## 2016-11-05 DIAGNOSIS — R61 Generalized hyperhidrosis: Secondary | ICD-10-CM | POA: Diagnosis not present

## 2016-11-05 DIAGNOSIS — K529 Noninfective gastroenteritis and colitis, unspecified: Secondary | ICD-10-CM | POA: Diagnosis not present

## 2016-11-05 DIAGNOSIS — R21 Rash and other nonspecific skin eruption: Secondary | ICD-10-CM | POA: Diagnosis not present

## 2016-11-11 DIAGNOSIS — Z85828 Personal history of other malignant neoplasm of skin: Secondary | ICD-10-CM | POA: Diagnosis not present

## 2016-11-11 DIAGNOSIS — L821 Other seborrheic keratosis: Secondary | ICD-10-CM | POA: Diagnosis not present

## 2016-11-11 DIAGNOSIS — L738 Other specified follicular disorders: Secondary | ICD-10-CM | POA: Diagnosis not present

## 2016-12-22 DIAGNOSIS — M25571 Pain in right ankle and joints of right foot: Secondary | ICD-10-CM | POA: Diagnosis not present

## 2016-12-24 ENCOUNTER — Ambulatory Visit (INDEPENDENT_AMBULATORY_CARE_PROVIDER_SITE_OTHER): Payer: Medicare Other | Admitting: Orthopedic Surgery

## 2016-12-31 ENCOUNTER — Ambulatory Visit (INDEPENDENT_AMBULATORY_CARE_PROVIDER_SITE_OTHER): Payer: Medicare Other | Admitting: Orthopedic Surgery

## 2017-01-01 ENCOUNTER — Ambulatory Visit (INDEPENDENT_AMBULATORY_CARE_PROVIDER_SITE_OTHER): Payer: Medicare Other | Admitting: Orthopedic Surgery

## 2017-01-01 ENCOUNTER — Encounter (INDEPENDENT_AMBULATORY_CARE_PROVIDER_SITE_OTHER): Payer: Self-pay | Admitting: Orthopedic Surgery

## 2017-01-01 ENCOUNTER — Ambulatory Visit (INDEPENDENT_AMBULATORY_CARE_PROVIDER_SITE_OTHER): Payer: Medicare Other

## 2017-01-01 VITALS — Ht 60.0 in | Wt 188.0 lb

## 2017-01-01 DIAGNOSIS — M79672 Pain in left foot: Secondary | ICD-10-CM

## 2017-01-01 NOTE — Progress Notes (Signed)
Office Visit Note   Patient: Vanessa Nielsen           Date of Birth: 01-Jan-1934           MRN: [id number] Visit Date: 01/01/2017              Requested by: Velna Hatchet, MD [address] [place], [place] [address] PCP: Velna Hatchet, MD  Chief Complaint  Patient presents with  . 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 Patient states she's had multiple partial amputations of her toes due to infection in the past. Patient is seen in consultation from Dr. Lynann Bologna  Assessment & Plan: Visit Diagnoses:  1. Pain in left foot     Plan: Recommended a new balance for the with sneaker as well as orthotics to help protect her foot. The nail was debrided no signs of paronychial infection follow-up as needed discussed that she develops any redness swelling ulceration or change in her symptoms to follow-up immediately.  Follow-Up Instructions: Return if symptoms worsen or fail to improve.   Ortho Exam  Patient is alert, oriented, no adenopathy, well-dressed, normal affect, normal respiratory effort. Patient's ambulating in a wheelchair. Examination she has a weakly palpable dorsalis pedis pulse her foot is plantigrade she does have swelling but there is no redness no ulceration. Patient has tenderness to palpation laterally beneath the fourth and fifth metatarsal heads radiograph shows no fractures her shoewear was reviewed and her shoe is about a centimeter narrower than her foot. Patient has thick and discolored onychomycotic nail on the second toe. Approximately three quarters of this nail was removed there was no paronychial infection no abscess she does have some redness over the tip of the toe from blunt trauma from her tight shoe wear but there is no signs of  infection.  Imaging: Xr Foot Complete Left  Result Date: 01/01/2017 Three-view radiographs of the left foot shows calcification of all the digital arteries patient has had multiple surgeries with partial amputation of most of her toes. There is no evidence of a stress fracture no evidence of osteomyelitis.   Labs: Lab Results  Component Value Date   ESRSEDRATE 45 (H) 10/16/2010   CRP 12.1 (H) 10/16/2010   REPTSTATUS 06/08/2010 FINAL 06/07/2010   CULT NO GROWTH 06/07/2010   LABORGA Multiple bacterial morphotypes present, none 12020/07/2812   LABORGA predominant. Suggest appropriate recollection if  12020/07/2812   LABORGA clinically indicated. 12020/07/2812    Orders:  Orders Placed This Encounter  Procedures  . XR Foot Complete Left   No orders of the defined types were placed in this encounter.    Procedures: No procedures performed  Clinical Data: No additional findings.  ROS:  All other systems negative, except as noted in the HPI. Review of Systems  Objective: Vital Signs: Ht 5' (1.524 m)   Wt 188 lb (85.3 kg)   BMI 36.72 kg/m   Specialty Comments:  No specialty comments available.  PMFS History: Patient Active Problem List   Diagnosis Date Noted  . Hypokalemia 12/21/2013  . Emesis, persistent 12/21/2013  . Cushing syndrome ([place])   . Allergic rhinitis   . Ischemic heart disease 02/25/2011  . Benign hypertensive heart disease without heart failure 02/25/2011  . Hypercholesterolemia 02/25/2011  .  Spina bifida of lumbar spine ([place]) 02/25/2011  . Irritable bowel syndrome (IBS) 02/25/2011   Past Medical History:  Diagnosis Date  . Allergic rhinitis   . Coronary artery disease    2v CABG @ [facility]  . Hypercholesteremia   . Hyperlipidemia   . Hypertension   . Iatrogenic Cushing's disease ([place])   . Multiple allergies    induced by pain: swelling in UE/face/torso, nausea, hives followed by hypotension unless tx with high dose steroids  . Osteoarthritis   .  Scoliosis   . Spina bifida     Family History  Problem Relation Age of Onset  . Arthritis Mother   . Cancer Father 87    colon  . Arthritis Father   . Heart disease Other   . Hypertension Other   . Hypertension Maternal Aunt   . Heart attack Neg Hx   . Stroke Neg Hx     Past Surgical History:  Procedure Laterality Date  . ABDOMINAL HYSTERECTOMY  1981 or 82  . ANAL RECTAL MANOMETRY N/A 02/13/2014   Procedure: ANAL RECTAL MANOMETRY;  Surgeon: Arta Silence, MD;  Location: [facility];  Service: Endoscopy;  Laterality: N/A;  . APPENDECTOMY  1960  . CARDIAC CATHETERIZATION  11/2006  . CARDIOVASCULAR STRESS TEST  09/19/2010  . CHOLECYSTECTOMY  2011  . CHOLECYSTECTOMY, LAPAROSCOPIC  05/27/2010  . CORONARY ANGIOPLASTY  11/2006   left anterior descending vessel  . Hardware removed in spine due to loos screws  1997  . REDUCTION MAMMAPLASTY Bilateral 1998  . Surgery for scoliosis  1995  . TONSILLECTOMY  1943  . TOTAL KNEE ARTHROPLASTY  09/2010   right   Social History   Occupational History  . retired Retired   Social History Main Topics  . Smoking status: Never Smoker  . Smokeless tobacco: Never Used     Comment: married, lives with spouse. 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 Alcohol use No  . Drug use: Unknown  . Sexual activity: Not on file

## 2017-01-16 DIAGNOSIS — Z85828 Personal history of other malignant neoplasm of skin: Secondary | ICD-10-CM | POA: Diagnosis not present

## 2017-01-16 DIAGNOSIS — L308 Other specified dermatitis: Secondary | ICD-10-CM | POA: Diagnosis not present

## 2017-01-22 ENCOUNTER — Other Ambulatory Visit: Payer: Self-pay | Admitting: *Deleted

## 2017-01-22 MED ORDER — METOPROLOL TARTRATE 50 MG PO TABS
25.0000 mg | ORAL_TABLET | Freq: Two times a day (BID) | ORAL | 0 refills | Status: DC
Start: 2017-01-22 — End: 2017-09-02

## 2017-01-26 DIAGNOSIS — J029 Acute pharyngitis, unspecified: Secondary | ICD-10-CM | POA: Diagnosis not present

## 2017-02-02 ENCOUNTER — Emergency Department (HOSPITAL_COMMUNITY)
Admission: EM | Admit: 2017-02-02 | Discharge: 2017-02-02 | Disposition: A | Discharge status: Home / Self Care | Payer: Medicare Other | Attending: Emergency Medicine | Admitting: Emergency Medicine

## 2017-02-02 ENCOUNTER — Encounter (HOSPITAL_COMMUNITY): Payer: Self-pay | Admitting: Emergency Medicine

## 2017-02-02 ENCOUNTER — Emergency Department (HOSPITAL_COMMUNITY): Payer: Medicare Other

## 2017-02-02 DIAGNOSIS — R110 Nausea: Secondary | ICD-10-CM

## 2017-02-02 DIAGNOSIS — Z96651 Presence of right artificial knee joint: Secondary | ICD-10-CM | POA: Diagnosis not present

## 2017-02-02 DIAGNOSIS — I10 Essential (primary) hypertension: Secondary | ICD-10-CM | POA: Insufficient documentation

## 2017-02-02 DIAGNOSIS — I2510 Atherosclerotic heart disease of native coronary artery without angina pectoris: Secondary | ICD-10-CM | POA: Insufficient documentation

## 2017-02-02 DIAGNOSIS — Z7982 Long term (current) use of aspirin: Secondary | ICD-10-CM | POA: Insufficient documentation

## 2017-02-02 DIAGNOSIS — E876 Hypokalemia: Secondary | ICD-10-CM | POA: Insufficient documentation

## 2017-02-02 DIAGNOSIS — Z951 Presence of aortocoronary bypass graft: Secondary | ICD-10-CM | POA: Insufficient documentation

## 2017-02-02 DIAGNOSIS — Z79899 Other long term (current) drug therapy: Secondary | ICD-10-CM | POA: Diagnosis not present

## 2017-02-02 DIAGNOSIS — J9811 Atelectasis: Secondary | ICD-10-CM | POA: Diagnosis not present

## 2017-02-02 DIAGNOSIS — R0902 Hypoxemia: Secondary | ICD-10-CM | POA: Diagnosis not present

## 2017-02-02 DIAGNOSIS — R140 Abdominal distension (gaseous): Secondary | ICD-10-CM | POA: Diagnosis not present

## 2017-02-02 LAB — URINALYSIS, ROUTINE W REFLEX MICROSCOPIC
BILIRUBIN URINE: NEGATIVE
GLUCOSE, UA: NEGATIVE mg/dL
Hgb urine dipstick: NEGATIVE
Ketones, ur: NEGATIVE mg/dL
NITRITE: NEGATIVE
PH: 6 (ref 5.0–8.0)
Protein, ur: NEGATIVE mg/dL
SPECIFIC GRAVITY, URINE: 1.021 (ref 1.005–1.030)

## 2017-02-02 LAB — CBC
HEMATOCRIT: 40.6 % (ref 36.0–46.0)
HEMOGLOBIN: 13.4 g/dL (ref 12.0–15.0)
MCH: 29.5 pg (ref 26.0–34.0)
MCHC: 33 g/dL (ref 30.0–36.0)
MCV: 89.4 fL (ref 78.0–100.0)
Platelets: 274 10*3/uL (ref 150–400)
RBC: 4.54 MIL/uL (ref 3.87–5.11)
RDW: 14 % (ref 11.5–15.5)
WBC: 9.6 10*3/uL (ref 4.0–10.5)

## 2017-02-02 LAB — COMPREHENSIVE METABOLIC PANEL
ALBUMIN: 3.1 g/dL — AB (ref 3.5–5.0)
ALK PHOS: 72 U/L (ref 38–126)
ALT: 10 U/L — ABNORMAL LOW (ref 14–54)
AST: 19 U/L (ref 15–41)
Anion gap: 10 (ref 5–15)
BILIRUBIN TOTAL: 0.7 mg/dL (ref 0.3–1.2)
BUN: 14 mg/dL (ref 6–20)
CALCIUM: 8.7 mg/dL — AB (ref 8.9–10.3)
CO2: 29 mmol/L (ref 22–32)
Chloride: 94 mmol/L — ABNORMAL LOW (ref 101–111)
Creatinine, Ser: 1.06 mg/dL — ABNORMAL HIGH (ref 0.44–1.00)
GFR calc Af Amer: 56 mL/min — ABNORMAL LOW (ref >60)
GFR calc non Af Amer: 48 mL/min — ABNORMAL LOW (ref >60)
GLUCOSE: 130 mg/dL — AB (ref 65–99)
Potassium: 2.6 mmol/L — CL (ref 3.5–5.1)
SODIUM: 133 mmol/L — AB (ref 135–145)
TOTAL PROTEIN: 6.1 g/dL — AB (ref 6.5–8.1)

## 2017-02-02 LAB — LIPASE, BLOOD: Lipase: 24 U/L (ref 11–51)

## 2017-02-02 LAB — POTASSIUM: Potassium: 3.2 mmol/L — ABNORMAL LOW (ref 3.5–5.1)

## 2017-02-02 MED ORDER — ONDANSETRON 4 MG PO TBDP
4.0000 mg | ORAL_TABLET | Freq: Once | ORAL | Status: AC | PRN
  Administered 2017-02-02: 4 mg via ORAL

## 2017-02-02 MED ORDER — ONDANSETRON 4 MG PO TBDP
ORAL_TABLET | ORAL | Status: AC
  Filled 2017-02-02: qty 1

## 2017-02-02 MED ORDER — POTASSIUM CHLORIDE CRYS ER 20 MEQ PO TBCR
40.0000 meq | EXTENDED_RELEASE_TABLET | Freq: Once | ORAL | Status: AC
  Administered 2017-02-02: 40 meq via ORAL
  Filled 2017-02-02: qty 2

## 2017-02-02 MED ORDER — PROMETHAZINE HCL 25 MG/ML IJ SOLN
6.2500 mg | Freq: Once | INTRAMUSCULAR | Status: AC
  Administered 2017-02-02: 6.25 mg via INTRAVENOUS
  Filled 2017-02-02: qty 1

## 2017-02-02 MED ORDER — POTASSIUM CHLORIDE 10 MEQ/100ML IV SOLN
10.0000 meq | Freq: Once | INTRAVENOUS | Status: AC
  Administered 2017-02-02: 10 meq via INTRAVENOUS
  Filled 2017-02-02: qty 100

## 2017-02-02 MED ORDER — DICYCLOMINE HCL 10 MG PO CAPS
20.0000 mg | ORAL_CAPSULE | Freq: Once | ORAL | Status: AC
  Administered 2017-02-02: 20 mg via ORAL
  Filled 2017-02-02: qty 2

## 2017-02-02 MED ORDER — POTASSIUM CHLORIDE ER 20 MEQ PO TBCR: 20.0000 meq | EXTENDED_RELEASE_TABLET | Freq: Two times a day (BID) | ORAL | 0 refills | Status: DC

## 2017-02-02 MED ORDER — SODIUM CHLORIDE 0.9 % IV BOLUS (SEPSIS)
500.0000 mL | Freq: Once | INTRAVENOUS | Status: AC
  Administered 2017-02-02: 500 mL via INTRAVENOUS

## 2017-02-02 MED ORDER — POTASSIUM CHLORIDE 10 MEQ/100ML IV SOLN
10.0000 meq | INTRAVENOUS | Status: AC
  Administered 2017-02-02 (×2): 10 meq via INTRAVENOUS
  Filled 2017-02-02 (×2): qty 100

## 2017-02-02 MED ORDER — LINACLOTIDE 145 MCG PO CAPS
290.0000 ug | ORAL_CAPSULE | Freq: Once | ORAL | Status: DC
  Filled 2017-02-02: qty 2

## 2017-02-02 MED ORDER — MAGNESIUM SULFATE 2 GM/50ML IV SOLN
2.0000 g | Freq: Once | INTRAVENOUS | Status: AC
  Administered 2017-02-02: 2 g via INTRAVENOUS
  Filled 2017-02-02: qty 50

## 2017-02-02 NOTE — ED Provider Notes (Signed)
Assumed care from Dr. Dina Rich at 7 AM. Briefly, the patient is a 83 y.o. female with PMHx of  has a past medical history of Allergic rhinitis; Coronary artery disease; Hypercholesteremia; Hyperlipidemia; Hypertension; Iatrogenic Cushing's disease ([place]); Multiple allergies; Osteoarthritis; Scoliosis; and Spina bifida. here with acute on chronic abdominal pain. Pt has extensive h/o chronic abd pain, followed by Dr. Paulita Fujita and recently referred to Dr. Hilarie Fredrickson. Lab work shows acute on chronic hypoK, is o/w very reassuring. Normal WBC. UA without UTI. Pt given IV/PO K - plan to repeat K, d/c if improving.   Labs Reviewed  COMPREHENSIVE METABOLIC PANEL - Abnormal; Notable for the following:       Result Value   Sodium 133 (*)    Potassium 2.6 (*)    Chloride 94 (*)    Glucose, Bld 130 (*)    Creatinine, Ser 1.06 (*)    Calcium 8.7 (*)    Total Protein 6.1 (*)    Albumin 3.1 (*)    ALT 10 (*)    GFR calc non Af Amer 48 (*)    GFR calc Af Amer 56 (*)    All other components within normal limits  URINALYSIS, ROUTINE W REFLEX MICROSCOPIC - Abnormal; Notable for the following:    Leukocytes, UA TRACE (*)    Bacteria, UA RARE (*)    Squamous Epithelial / LPF 0-5 (*)    All other components within normal limits  LIPASE, BLOOD  CBC  POTASSIUM    Course of Care: -Repeat K 3.2. Of note, vitals recorded erroneously in EPIC - pt is NOT hypoxic, the values recorded as such were with poor waveform 2/2 pt talking/moving, and she is satting 93% on RA at this time and has not been on O2 during her ED stay. CXR is clear. Lungs CTAB. Given no signs of acute emergent pathology with well-documented chronic complaints of same, will d/c with outpt potassium, GI follow-up. Return precautions given.   Clinical Impression: 1. Nausea   2. Hypokalemia     Disposition: Discharge  Condition: Good  I have discussed the results, Dx and Tx plan with the pt(& family if present). He/she/they expressed understanding and  agree(s) with the plan. Discharge instructions discussed at great length. Strict return precautions discussed and pt &/or family have verbalized understanding of the instructions. No further questions at time of discharge.    New Prescriptions   POTASSIUM CHLORIDE 20 MEQ TBCR    Take 20 mEq by mouth 2 (two) times daily.    Follow Up: Velna Hatchet, MD [address] [place]  [id number] [phone]   Repeat potassium by end of the week       Duffy Bruce, MD 02/02/17 [phone]

## 2017-02-02 NOTE — ED Notes (Signed)
ekg given to dr Dina Rich

## 2017-02-02 NOTE — ED Notes (Signed)
Returned from xray placed back on monitor. nad noted abc intact.

## 2017-02-02 NOTE — ED Notes (Signed)
Patient transported to X-ray 

## 2017-02-02 NOTE — ED Notes (Signed)
Called to bedside, pt stating that she is not feeling well and that she feels that she is receiving too much K+. Informed MD. Pt informed that the K+ is needed per MD and that we will reevaluate K+ level after infusion.

## 2017-02-02 NOTE — ED Triage Notes (Signed)
Pt presents with abd pain, bloating, and distension x several weeks but worse tonight with nausea, no vomiting; pt also reports taking BP at home with it being high and doubled up on her lopressor

## 2017-02-02 NOTE — ED Notes (Signed)
Pt drinking ginger ale  

## 2017-02-02 NOTE — ED Provider Notes (Signed)
[facility] DEPT Provider Note   CSN: [id number] Arrival date & time: 02/02/17  0147     History   Chief Complaint Chief Complaint  Patient presents with  . Abdominal Pain  . Bloated  . Nausea    HPI Vanessa Nielsen is a 83 y.o. female.  HPI  This is an 83 year old female with history of coronary artery disease, hypertension, hyperlipidemia, who presents with nausea. Patient reports chronic nausea. She takes Zofran and Phenergan at home. She also reports chronic abdominal bloating. However, tonight she states her nausea got worse. She has not had any vomiting. No chest pain or shortness of breath. She states that she got concerned with her blood pressure was elevated at home. She took a double dose of Lopressor. She denies any headaches. She denies any acute worsening of abdominal pain. She states "I just feel full and bloated." She takes multiple laxatives and Linzess and sees Dr. Paulita Fujita for her symptoms. She reports that previously she had some relief of her nausea after bowel movements but that is no longer the case. She was given Zofran in triage with minimal relief of her symptoms.  Past Medical History:  Diagnosis Date  . Allergic rhinitis   . Coronary artery disease    2v CABG @ [facility]  . Hypercholesteremia   . Hyperlipidemia   . Hypertension   . Iatrogenic Cushing's disease ([place])   . Multiple allergies    induced by pain: swelling in UE/face/torso, nausea, hives followed by hypotension unless tx with high dose steroids  . Osteoarthritis   . Scoliosis   . Spina bifida     Patient Active Problem List   Diagnosis Date Noted  . Hypokalemia 12/21/2013  . Emesis, persistent 12/21/2013  . Cushing syndrome ([place])   . Allergic rhinitis   . Ischemic heart disease 02/25/2011  . Benign hypertensive heart disease without heart failure 02/25/2011  . Hypercholesterolemia 02/25/2011  . Spina bifida of lumbar spine ([place]) 02/25/2011  . Irritable bowel syndrome (IBS)  02/25/2011    Past Surgical History:  Procedure Laterality Date  . ABDOMINAL HYSTERECTOMY  1981 or 84  . ANAL RECTAL MANOMETRY N/A 02/13/2014   Procedure: ANAL RECTAL MANOMETRY;  Surgeon: Arta Silence, MD;  Location: [facility];  Service: Endoscopy;  Laterality: N/A;  . APPENDECTOMY  1960  . CARDIAC CATHETERIZATION  11/2006  . CARDIOVASCULAR STRESS TEST  09/19/2010  . CHOLECYSTECTOMY  2011  . CHOLECYSTECTOMY, LAPAROSCOPIC  05/27/2010  . CORONARY ANGIOPLASTY  11/2006   left anterior descending vessel  . Hardware removed in spine due to loos screws  1997  . REDUCTION MAMMAPLASTY Bilateral 1998  . Surgery for scoliosis  1995  . TONSILLECTOMY  1943  . TOTAL KNEE ARTHROPLASTY  09/2010   right    OB History    No data available       Home Medications    Prior to Admission medications   Medication Sig Start Date End Date Taking? Authorizing Provider  amLODipine (NORVASC) 5 MG tablet Take 1 tablet (5 mg total) by mouth daily. 09/17/16  Yes Skeet Latch, MD  aspirin 81 MG EC tablet Take 81 mg by mouth daily.     Yes [provider]  atorvastatin (LIPITOR) 40 MG tablet Take 1 tablet (40 mg total) by mouth daily. 08/05/16  Yes Skeet Latch, MD  fluticasone [facility]) 50 MCG/ACT nasal spray USE 2 SPRAYS INTO EACH NOSTRIL EVERY DAY 09/19/15  Yes Darlin Coco, MD  LINZESS 290 MCG CAPS capsule Take  290 mcg by mouth daily. 03/02/15  Yes [provider]  losartan-hydrochlorothiazide (HYZAAR) 100-25 MG tablet Take 1 tablet by mouth daily. Please contact office to schedule follow up appt. [phone] 10/16/16  Yes Skeet Latch, MD  metoprolol (LOPRESSOR) 50 MG tablet Take 0.5 tablets (25 mg total) by mouth 2 (two) times daily. 01/22/17  Yes Skeet Latch, MD  nitroGLYCERIN (NITROSTAT) 0.4 MG SL tablet Place 0.4 mg under the tongue every 5 (five) minutes as needed for chest pain.   Yes [provider]  ondansetron (ZOFRAN) 8 MG tablet Take 8 mg by mouth  every 8 (eight) hours as needed for nausea or vomiting.    Yes [provider]  polyethylene glycol (MIRALAX / GLYCOLAX) packet Take 17 g by mouth 2 (two) times daily.   Yes [provider]  potassium chloride (MICRO-K) 10 MEQ CR capsule Take 1 capsule (10 mEq total) by mouth 2 (two) times daily. NEED OV. 10/31/16  Yes Skeet Latch, MD  promethazine (PHENERGAN) 25 MG tablet Take 25 mg by mouth every 6 (six) hours as needed for nausea or vomiting.   Yes [provider]  meclizine (ANTIVERT) 25 MG tablet TAKE 1 TABLET BY MOUTH 3 TIMES A DAY AS NEEDED Patient not taking: Reported on 02/02/2017 03/06/16   Lorretta Harp, MD    Family History Family History  Problem Relation Age of Onset  . Arthritis Mother   . Cancer Father 56       colon  . Arthritis Father   . Heart disease Other   . Hypertension Other   . Hypertension Maternal Aunt   . Heart attack Neg Hx   . Stroke Neg Hx     Social History Social History  Substance Use Topics  . Smoking status: Never Smoker  . Smokeless tobacco: Never Used     Comment: married, lives with spouse. 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 Alcohol use No     Allergies   Cardura [doxazosin mesylate]; Clarithromycin; Erythromycin; Guaifenesin; and Ketorolac tromethamine   Review of Systems Review of Systems  Constitutional: Negative for fever.  Respiratory: Negative for shortness of breath.   Cardiovascular: Negative for chest pain.  Gastrointestinal: Positive for abdominal distention and nausea. Negative for constipation, diarrhea and vomiting.  Genitourinary: Negative for dysuria.  Musculoskeletal: Negative for back pain.  All other systems reviewed and are negative.    Physical Exam Updated Vital Signs BP (!) 152/72 (BP Location: Right Arm)   Pulse 60   Temp 98.4 F (36.9 C) (Oral)   Resp 18   Ht 5\' 2"  (1.575 m)   SpO2 96%   Physical Exam  Constitutional: She is  oriented to person, place, and time. No distress.  Elderly, overweight, no acute distress  HENT:  Head: Normocephalic and atraumatic.  Cardiovascular: Normal rate, regular rhythm and normal heart sounds.   Pulmonary/Chest: Effort normal and breath sounds normal. No respiratory distress. She has no wheezes.  Nasal cannula in place Midline sternotomy scar  Abdominal: Soft. Bowel sounds are normal. There is no tenderness. There is no guarding.  Neurological: She is alert and oriented to person, place, and time.  Skin: Skin is warm and dry.  Psychiatric: She has a normal mood and affect.  Nursing note and vitals reviewed.    ED Treatments / Results  Labs (all labs ordered are listed, but only abnormal results are displayed) Labs Reviewed  COMPREHENSIVE METABOLIC PANEL - Abnormal; Notable for the following:  Result Value   Sodium 133 (*)    Potassium 2.6 (*)    Chloride 94 (*)    Glucose, Bld 130 (*)    Creatinine, Ser 1.06 (*)    Calcium 8.7 (*)    Total Protein 6.1 (*)    Albumin 3.1 (*)    ALT 10 (*)    GFR calc non Af Amer 48 (*)    GFR calc Af Amer 56 (*)    All other components within normal limits  LIPASE, BLOOD  CBC  URINALYSIS, ROUTINE W REFLEX MICROSCOPIC    EKG  EKG Interpretation  Date/Time:  Monday Feb 02 2017 03:23:06 EDT Ventricular Rate:  62 PR Interval:    QRS Duration: 100 QT Interval:  471 QTC Calculation: 479 R Axis:     Text Interpretation:  Sinus rhythm Atrial premature complex Low voltage, precordial leads Borderline repolarization abnormality Confirmed by Thayer Jew [phone]) on 02/02/2017 5:19:17 AM Also confirmed by Thayer Jew [phone]), editor Hattie Perch ([id number])  on 02/02/2017 6:41:14 AM       Radiology No results found.  Procedures Procedures (including critical care time)  Medications Ordered in ED Medications  ondansetron (ZOFRAN-ODT) 4 MG disintegrating tablet (not administered)  potassium chloride 10 mEq in  100 mL IVPB (not administered)  promethazine (PHENERGAN) injection 6.25 mg (not administered)  sodium chloride 0.9 % bolus 500 mL (not administered)  ondansetron (ZOFRAN-ODT) disintegrating tablet 4 mg (4 mg Oral Given 02/02/17 0153)     Initial Impression / Assessment and Plan / ED Course  I have reviewed the triage vital signs and the nursing notes.  Pertinent labs & imaging results that were available during my care of the patient were reviewed by me and considered in my medical decision making (see chart for details).  Clinical Course as of Feb 03 2320  Mon Feb 02, 2017  1027 Work up only notable for low potassium. Patient takes 20 mEq of potassium daily. She was given a total of 2 IV runs and 40 mEq by mouth. Discussed with the patient that given how low her potassium level is, would recommend admission for more IV potassium and recheck.  Patient does not wish to be admitted. She understands indication of discharge. I have convinced her to stay for 1 additional IV run of potassium and a repeat check this morning.  [CH]    Clinical Course User Index [CH] Zuleyka Kloc, Barbette Hair, MD   Results with nausea and abdominal pain. These appear acute on chronic. She sees Dr. Paulita Fujita at gastroenterology ED for these. She had acute worsening of nausea tonight. She is nontoxic-appearing. Abdomen is fairly benign. Basic lab work notable for potassium of 2.6. She has had low potassium in the past. She takes 20 mEq of potassium daily. This was supplemented. Otherwise workup is reassuring. Patient adamantly refuses to be admitted for supplemental potassium. See clinical course above. Patient will be discharged after repeat potassium check with supplemental potassium 5 days increased to 40 mEq daily.  After history, exam, and medical workup I feel the patient has been appropriately medically screened and is safe for discharge home. Pertinent diagnoses were discussed with the patient. Patient was given return  precautions.   Final Clinical Impressions(s) / ED Diagnoses   Final diagnoses:  Nausea  Hypokalemia    New Prescriptions New Prescriptions   No medications on file     Merryl Hacker, MD 02/02/17 2324

## 2017-02-05 ENCOUNTER — Ambulatory Visit (INDEPENDENT_AMBULATORY_CARE_PROVIDER_SITE_OTHER): Payer: Medicare Other | Admitting: Cardiovascular Disease

## 2017-02-05 VITALS — BP 127/70 | HR 60 | Ht 62.0 in | Wt 187.8 lb

## 2017-02-05 DIAGNOSIS — E7800 Pure hypercholesterolemia, unspecified: Secondary | ICD-10-CM

## 2017-02-05 DIAGNOSIS — I119 Hypertensive heart disease without heart failure: Secondary | ICD-10-CM

## 2017-02-05 DIAGNOSIS — I259 Chronic ischemic heart disease, unspecified: Secondary | ICD-10-CM | POA: Diagnosis not present

## 2017-02-05 DIAGNOSIS — E876 Hypokalemia: Secondary | ICD-10-CM | POA: Diagnosis not present

## 2017-02-05 DIAGNOSIS — I209 Angina pectoris, unspecified: Secondary | ICD-10-CM | POA: Diagnosis not present

## 2017-02-05 MED ORDER — NITROGLYCERIN 0.4 MG SL SUBL: 0.4000 mg | SUBLINGUAL_TABLET | SUBLINGUAL | 1 refills | Status: DC | PRN

## 2017-02-05 NOTE — Patient Instructions (Addendum)
Medication Instructions:  Continue current medications  Labwork: BMP  Testing/Procedures: Your physician has requested that you have a lexiscan myoview. For further information please visit [email or web address]. Please follow instruction sheet, as given.   Follow-Up: Your physician recommends that you schedule a follow-up appointment in: 1 Month   Any Other Special Instructions Will Be Listed Below (If Applicable).   If you need a refill on your cardiac medications before your next appointment, please call your pharmacy.

## 2017-02-05 NOTE — Progress Notes (Signed)
Cardiology Office Note   Date:  02/06/2017   ID:  Vanessa Nielsen, DOB 26-Jul-1934, MRN [id number]  PCP:  Vanessa Hatchet, MD  Cardiologist:   Vanessa Latch, MD   Chief Complaint  Patient presents with  . Follow-up    pt went to the ED sunday for lightheadedness and dizziness, pt c/o occasional CP     History of Present Illness: Vanessa Nielsen is a 83 y.o. female with hypertension, hyperlipidemia, CAD s/p CABG (LIMA-->LAD 2008), Spina bifida, who presents for follow up. Vanessa Nielsen had a nuclear stress test in 2012 and a Lexiscan Myoview 11/21/14 that were negative for ischemia.  LVEF was 62%.  Vanessa Nielsen notes increased episodes of chest pain lately.  The episodes are substernal and do not radiate.  When she takes nitroglycerin the symptoms improve.  This has been ongoing for the last several months.  There is no associated shortness of breath.  She doesn't get much exercise because of her inability to walk.  She hasn't been able to walk far for 10 years due to back pain and leg pain.  She denies shortness of breath, nausea or diaphroesis.  She also denies lower extremity edema, orthopnea or PND.   Past Medical History:  Diagnosis Date  . Allergic rhinitis   . Coronary artery disease    2v CABG @ [facility]  . Hypercholesteremia   . Hyperlipidemia   . Hypertension   . Iatrogenic Cushing's disease ([place])   . Multiple allergies    induced by pain: swelling in UE/face/torso, nausea, hives followed by hypotension unless tx with high dose steroids  . Osteoarthritis   . Scoliosis   . Spina bifida     Past Surgical History:  Procedure Laterality Date  . ABDOMINAL HYSTERECTOMY  1981 or 84  . ANAL RECTAL MANOMETRY N/A 02/13/2014   Procedure: ANAL RECTAL MANOMETRY;  Surgeon: Arta Silence, MD;  Location: [facility];  Service: Endoscopy;  Laterality: N/A;  . APPENDECTOMY  1960  . CARDIAC CATHETERIZATION  11/2006  . CARDIOVASCULAR STRESS TEST  09/19/2010  . CHOLECYSTECTOMY  2011    . CHOLECYSTECTOMY, LAPAROSCOPIC  05/27/2010  . CORONARY ANGIOPLASTY  11/2006   left anterior descending vessel  . Hardware removed in spine due to loos screws  1997  . REDUCTION MAMMAPLASTY Bilateral 1998  . Surgery for scoliosis  1995  . TONSILLECTOMY  1943  . TOTAL KNEE ARTHROPLASTY  09/2010   right     Current Outpatient Prescriptions  Medication Sig Dispense Refill  . amLODipine (NORVASC) 5 MG tablet Take 1 tablet (5 mg total) by mouth daily. 90 tablet 0  . aspirin 81 MG EC tablet Take 81 mg by mouth daily.      Marland Kitchen atorvastatin (LIPITOR) 40 MG tablet Take 1 tablet (40 mg total) by mouth daily. 90 tablet 3  . fluticasone (FLONASE) 50 MCG/ACT nasal spray USE 2 SPRAYS INTO EACH NOSTRIL EVERY DAY 16 g 3  . LINZESS 290 MCG CAPS capsule Take 290 mcg by mouth daily.  1  . losartan-hydrochlorothiazide (HYZAAR) 100-25 MG tablet Take 1 tablet by mouth daily. Please contact office to schedule follow up appt. [phone] 30 tablet 2  . metoprolol (LOPRESSOR) 50 MG tablet Take 0.5 tablets (25 mg total) by mouth 2 (two) times daily. 90 tablet 0  . nitroGLYCERIN (NITROSTAT) 0.4 MG SL tablet Place 1 tablet (0.4 mg total) under the tongue every 5 (five) minutes as needed for chest pain. 25 tablet 1  . ondansetron (  ZOFRAN) 8 MG tablet Take 8 mg by mouth every 8 (eight) hours as needed for nausea or vomiting.     . polyethylene glycol (MIRALAX / GLYCOLAX) packet Take 17 g by mouth 2 (two) times daily.    . potassium chloride 20 MEQ TBCR Take 20 mEq by mouth 2 (two) times daily. 10 tablet 0  . promethazine (PHENERGAN) 25 MG tablet Take 25 mg by mouth every 6 (six) hours as needed for nausea or vomiting.     No current facility-administered medications for this visit.     Allergies:   Cardura [doxazosin mesylate]; Clarithromycin; Erythromycin; Guaifenesin; and Ketorolac tromethamine    Social History:  The patient  reports that she has never smoked. She has never used smokeless tobacco. She reports  that she does not drink alcohol.   Family History:  The patient's family history includes Arthritis in her father and mother; Cancer (age of onset: 42) in her father; Heart disease in her other; Hypertension in her maternal aunt and other.    ROS:  Please see the history of present illness.   Otherwise, review of systems are positive for constipation and diarrhea.   All other systems are reviewed and negative.    PHYSICAL EXAM: VS:  BP 127/70   Pulse 60   Ht 5\' 2"  (1.575 m)   Wt 85.2 kg (187 lb 12.8 oz)   BMI 34.35 kg/m  , BMI Body mass index is 34.35 kg/m. GENERAL:  Well appearing.  No acute distress.  Seated in wheelchair HEENT:  Pupils equal round and reactive, fundi not visualized, oral mucosa unremarkable NECK:  No jugular venous distention, waveform within normal limits, carotid upstroke brisk and symmetric, no bruits LUNGS:  Clear to auscultation bilaterally.  No crackles, rhonchi or wheezes HEART:  RRR.  PMI not displaced or sustained,S1 and S2 within normal limits, no S3, no S4, no clicks, no rubs, no murmurs ABD:  Flat, positive bowel sounds normal in frequency in pitch, no bruits, no rebound, no guarding, no midline pulsatile mass, no hepatomegaly, no splenomegaly EXT:  2 plus pulses throughout, no edema, no cyanosis no clubbing SKIN:  No rashes no nodules NEURO:  Cranial nerves II through XII grossly intact, motor grossly intact throughout PSYCH:  Cognitively intact, oriented to person place and time    EKG:  EKG is not ordered today. The ekg ordered 06/05/16 demonstrates sinus rhythm rate 71 bpm.   Recent Labs: 02/02/2017: ALT 10; BUN 14; Creatinine, Ser 1.06; Hemoglobin 13.4; Platelets 274; Potassium 3.2; Sodium 133    Lipid Panel    Component Value Date/Time   CHOL 196 11/06/2014 1051   TRIG 155.0 (H) 11/06/2014 1051   HDL 50.80 11/06/2014 1051   CHOLHDL 4 11/06/2014 1051   VLDL 31.0 11/06/2014 1051   LDLCALC 114 (H) 11/06/2014 1051   LDLDIRECT 144.4  02/12/2011 1018      Wt Readings from Last 3 Encounters:  02/05/17 85.2 kg (187 lb 12.8 oz)  01/01/17 85.3 kg (188 lb)  06/05/16 85.4 kg (188 lb 3.2 oz)      ASSESSMENT AND PLAN:  # CAD s/p CABG: Vanessa Nielsen notes episodes of angina that are responsive to nitroglycerin.  It is impossible to know if this is exertional as she is no longer able to walk.  We will get a Lexiscan Myoview to assess. Continue aspirin, atorvastatin, and metoprolol.  # Hypertension: BP at goal on metoprolol amlodipine, losartan and hydrochlorothiazide.  # Hyperlipidemia: Continue atorvastatin. We'll check lipids  and CMP today.     Current medicines are reviewed at length with the patient today.  The patient does not have concerns regarding medicines.  The following changes have been made:  no change  Labs/ tests ordered today include:   Orders Placed This Encounter  Procedures  . Basic Metabolic Panel (BMET)  . Myocardial Perfusion Imaging     Disposition:   FU with Kamri Gotsch C. Oval Linsey, MD, [facility] in 6 months.    This note was written with the assistance of speech recognition software.  Please excuse any transcriptional errors.  Signed, Nick Armel C. Oval Linsey, MD, [facility]  02/06/2017 8:39 PM    [place]

## 2017-02-10 DIAGNOSIS — E876 Hypokalemia: Secondary | ICD-10-CM | POA: Diagnosis not present

## 2017-02-11 ENCOUNTER — Telehealth: Payer: Self-pay | Admitting: Cardiovascular Disease

## 2017-02-11 ENCOUNTER — Inpatient Hospital Stay (HOSPITAL_COMMUNITY): Admission: RE | Admit: 2017-02-11 | Payer: Medicare Other | Source: Ambulatory Visit

## 2017-02-11 LAB — BASIC METABOLIC PANEL
BUN/Creatinine Ratio: 19 (ref 12–28)
BUN: 17 mg/dL (ref 8–27)
CALCIUM: 8.9 mg/dL (ref 8.7–10.3)
CO2: 26 mmol/L (ref 18–29)
CREATININE: 0.89 mg/dL (ref 0.57–1.00)
Chloride: 94 mmol/L — ABNORMAL LOW (ref 96–106)
GFR, EST AFRICAN AMERICAN: 71 mL/min/{1.73_m2} (ref >59)
GFR, EST NON AFRICAN AMERICAN: 61 mL/min/{1.73_m2} (ref >59)
Glucose: 84 mg/dL (ref 65–99)
Potassium: 3.2 mmol/L — ABNORMAL LOW (ref 3.5–5.2)
Sodium: 137 mmol/L (ref 134–144)

## 2017-02-11 MED ORDER — POTASSIUM CHLORIDE ER 20 MEQ PO TBCR: 20.0000 meq | EXTENDED_RELEASE_TABLET | Freq: Two times a day (BID) | ORAL | 5 refills | Status: DC

## 2017-02-11 NOTE — Telephone Encounter (Signed)
Spoke with pt, aware she will need to increase her potassium but waiting for direction from dr Oval Linsey. She confirms her potassium dose is 10 meq twice daily. She took the last pill she has this morning so she will need a new script sent to [facility] on [address]. Patient also ask about the stress test she is scheduled for. She wanted a later start time due to problems with diarrhea in the first part of the day. She is aware the latest time here or [address] is 1:15 pm and the latest at the hospital is 12 noon. She wants to know if there is another test she can do that can be done later in the day. Will forward to dr randolp to review and advise.

## 2017-02-11 NOTE — Telephone Encounter (Signed)
Mr.Hinch is calling to speak with a nurse about changing the dosage of potassium medicine , based on her lab work that was done on yesterday . Please call

## 2017-02-11 NOTE — Telephone Encounter (Signed)
Patient has only been taking K+10 meq twice a day, she never received new Rx when she left ED New Rx sent to [facility]  Advised to take 20 meq twice a day until Dr Oval Linsey reviewed with correct dose

## 2017-02-12 NOTE — Telephone Encounter (Signed)
20 mEq bid is fine.

## 2017-02-12 NOTE — Telephone Encounter (Signed)
Follow up    The pharmacy did not have any of the potassium medication, she would like to speak with you

## 2017-02-12 NOTE — Telephone Encounter (Signed)
RN CALLED PHARMACY- PER PHARMACY, MEDICATION WILL BE READY THIS AFTERNOON IN ABOUT HOUR. PATIENT AWARE

## 2017-02-16 ENCOUNTER — Other Ambulatory Visit: Payer: Self-pay | Admitting: Cardiovascular Disease

## 2017-02-16 NOTE — Telephone Encounter (Signed)
REFILL 

## 2017-02-16 NOTE — Telephone Encounter (Signed)
Please review for refill, Thanks !  

## 2017-02-18 ENCOUNTER — Encounter (HOSPITAL_COMMUNITY): Payer: Medicare Other

## 2017-03-04 DIAGNOSIS — R159 Full incontinence of feces: Secondary | ICD-10-CM | POA: Diagnosis not present

## 2017-03-04 DIAGNOSIS — K5900 Constipation, unspecified: Secondary | ICD-10-CM | POA: Diagnosis not present

## 2017-03-09 ENCOUNTER — Other Ambulatory Visit: Payer: Self-pay | Admitting: Gastroenterology

## 2017-03-18 ENCOUNTER — Other Ambulatory Visit: Payer: Self-pay | Admitting: Cardiovascular Disease

## 2017-03-19 ENCOUNTER — Other Ambulatory Visit: Payer: Self-pay | Admitting: Cardiovascular Disease

## 2017-03-19 NOTE — Telephone Encounter (Signed)
Please review for refill. Thanks!  

## 2017-03-23 ENCOUNTER — Telehealth: Payer: Self-pay | Admitting: Cardiovascular Disease

## 2017-03-23 ENCOUNTER — Encounter (HOSPITAL_COMMUNITY): Admission: RE | Payer: Self-pay | Source: Ambulatory Visit

## 2017-03-23 ENCOUNTER — Ambulatory Visit (HOSPITAL_COMMUNITY): Admission: RE | Admit: 2017-03-23 | Payer: Medicare Other | Source: Ambulatory Visit | Admitting: Gastroenterology

## 2017-03-23 SURGERY — MANOMETRY, ANORECTAL

## 2017-03-23 NOTE — Telephone Encounter (Signed)
Received notes from [place] for appointment on 04/01/17 with Dr Oval Linsey.  Records put with Dr Dimas Alexandria schedule for 7/181/18. lp

## 2017-03-24 ENCOUNTER — Ambulatory Visit: Payer: Medicare Other | Admitting: Cardiovascular Disease

## 2017-03-24 ENCOUNTER — Telehealth (HOSPITAL_COMMUNITY): Payer: Self-pay

## 2017-03-24 NOTE — Telephone Encounter (Signed)
Encounter complete. 

## 2017-03-25 ENCOUNTER — Inpatient Hospital Stay (HOSPITAL_COMMUNITY): Admission: RE | Admit: 2017-03-25 | Payer: Medicare Other | Source: Ambulatory Visit

## 2017-04-01 ENCOUNTER — Encounter: Payer: Self-pay | Admitting: Cardiovascular Disease

## 2017-04-01 ENCOUNTER — Ambulatory Visit (INDEPENDENT_AMBULATORY_CARE_PROVIDER_SITE_OTHER): Payer: Medicare Other | Admitting: Cardiovascular Disease

## 2017-04-01 VITALS — BP 136/64 | HR 60 | Ht 59.0 in | Wt 183.0 lb

## 2017-04-01 DIAGNOSIS — E876 Hypokalemia: Secondary | ICD-10-CM | POA: Diagnosis not present

## 2017-04-01 DIAGNOSIS — I259 Chronic ischemic heart disease, unspecified: Secondary | ICD-10-CM | POA: Diagnosis not present

## 2017-04-01 DIAGNOSIS — Z951 Presence of aortocoronary bypass graft: Secondary | ICD-10-CM

## 2017-04-01 DIAGNOSIS — I119 Hypertensive heart disease without heart failure: Secondary | ICD-10-CM | POA: Diagnosis not present

## 2017-04-01 DIAGNOSIS — E7800 Pure hypercholesterolemia, unspecified: Secondary | ICD-10-CM

## 2017-04-01 MED ORDER — POTASSIUM CHLORIDE ER 10 MEQ PO CPCR: 20.0000 meq | ORAL_CAPSULE | Freq: Two times a day (BID) | ORAL | 3 refills | Status: DC

## 2017-04-01 NOTE — Patient Instructions (Signed)
Medication Instructions:  Your physician recommends that you continue on your current medications as directed. Please refer to the Current Medication list given to you today.  Labwork: FASTING LP/CMET SOON   Testing/Procedures: NONE  Follow-Up: Your physician wants you to follow-up in: 1 YEAR OV  You will receive a reminder letter in the mail two months in advance. If you don't receive a letter, please call our office to schedule the follow-up appointment.  If you need a refill on your cardiac medications before your next appointment, please call your pharmacy.  

## 2017-04-01 NOTE — Progress Notes (Signed)
Cardiology Office Note   Date:  04/01/2017   ID:  Vanessa Nielsen, DOB 03/26/1934, MRN [id number]  PCP:  Velna Hatchet, MD  Cardiologist:   Skeet Latch, MD   Chief Complaint  Patient presents with  . Follow-up    follow from nuc-did not have test done; has question about potassium      History of Present Illness: Vanessa Nielsen is a 83 y.o. female with hypertension, hyperlipidemia, CAD s/p CABG (LIMA-->LAD 2008), Spina bifida, who presents for follow up. Vanessa Nielsen had a nuclear stress test in 2012 and a Lexiscan Myoview 11/21/14 that were negative for ischemia.  LVEF was 62%. At her last appointment she reported chest pain and was referred for a [facility] but has not completed it.  She has a hard time leaving her house before the afternoon due to diarrhea.  She hasn't experienced any chest pain lately.  She also denies shortness of breath or edema.  She is not very phycially active due to her Spina bifida and leg weakness.  She remains unable to walk.  Her main complaints today are an all over body rash and GI upset.  She denies lower extremity edema, orthopnea or PND.   Past Medical History:  Diagnosis Date  . Allergic rhinitis   . Coronary artery disease    2v CABG @ [facility]  . Hypercholesteremia   . Hyperlipidemia   . Hypertension   . Iatrogenic Cushing's disease ([place])   . Multiple allergies    induced by pain: swelling in UE/face/torso, nausea, hives followed by hypotension unless tx with high dose steroids  . Osteoarthritis   . Scoliosis   . Spina bifida     Past Surgical History:  Procedure Laterality Date  . ABDOMINAL HYSTERECTOMY  1981 or 82  . ANAL RECTAL MANOMETRY N/A 02/13/2014   Procedure: ANAL RECTAL MANOMETRY;  Surgeon: Arta Silence, MD;  Location: [facility];  Service: Endoscopy;  Laterality: N/A;  . APPENDECTOMY  1960  . CARDIAC CATHETERIZATION  11/2006  . CARDIOVASCULAR STRESS TEST  09/19/2010  . CHOLECYSTECTOMY  2011  .  CHOLECYSTECTOMY, LAPAROSCOPIC  05/27/2010  . CORONARY ANGIOPLASTY  11/2006   left anterior descending vessel  . Hardware removed in spine due to loos screws  1997  . REDUCTION MAMMAPLASTY Bilateral 1998  . Surgery for scoliosis  1995  . TONSILLECTOMY  1943  . TOTAL KNEE ARTHROPLASTY  09/2010   right     Current Outpatient Prescriptions  Medication Sig Dispense Refill  . amLODipine (NORVASC) 5 MG tablet TAKE 1 TABLET BY MOUTH IF SYSTOLIC BLOOD PRESSURE ABOVE 150 90 tablet 0  . aspirin 81 MG EC tablet Take 81 mg by mouth daily.      Marland Kitchen atorvastatin (LIPITOR) 40 MG tablet Take 1 tablet (40 mg total) by mouth daily. 90 tablet 3  . fluticasone (FLONASE) 50 MCG/ACT nasal spray USE 2 SPRAYS INTO EACH NOSTRIL EVERY DAY 16 g 3  . LINZESS 290 MCG CAPS capsule Take 290 mcg by mouth daily.  1  . losartan-hydrochlorothiazide (HYZAAR) 100-25 MG tablet TAKE 1 TABLET BY MOUTH DAILY. PLEASE CONTACT OFFICE TO SCHEDULE FOLLOW UP APPT. [id number] 30 tablet 6  . metoprolol (LOPRESSOR) 50 MG tablet Take 0.5 tablets (25 mg total) by mouth 2 (two) times daily. 90 tablet 0  . nitroGLYCERIN (NITROSTAT) 0.4 MG SL tablet Place 1 tablet (0.4 mg total) under the tongue every 5 (five) minutes as needed for chest pain. 25 tablet 1  .  ondansetron (ZOFRAN) 8 MG tablet Take 8 mg by mouth every 8 (eight) hours as needed for nausea or vomiting.     . polyethylene glycol (MIRALAX / GLYCOLAX) packet Take 17 g by mouth 2 (two) times daily.    . potassium chloride (MICRO-K) 10 MEQ CR capsule Take 1 capsule (10 mEq total) by mouth 2 (two) times daily. KEEP OV. 180 capsule 1  . promethazine (PHENERGAN) 25 MG tablet Take 25 mg by mouth every 6 (six) hours as needed for nausea or vomiting.     No current facility-administered medications for this visit.     Allergies:   Cardura [doxazosin mesylate]; Clarithromycin; Erythromycin; Guaifenesin; and Ketorolac tromethamine    Social History:  The patient  reports that she has never  smoked. She has never used smokeless tobacco. She reports that she does not drink alcohol.   Family History:  The patient's family history includes Arthritis in her father and mother; Cancer (age of onset: 34) in her father; Heart disease in her other; Hypertension in her maternal aunt and other.    ROS:  Please see the history of present illness.   Otherwise, review of systems are positive for none   All other systems are reviewed and negative.    PHYSICAL EXAM: VS:  BP 136/64   Pulse 60   Ht 4\' 11"  (1.499 m)   Wt 83 kg (183 lb)   BMI 36.96 kg/m  , BMI Body mass index is 36.96 kg/m. GENERAL:  Well appearing.  Seated in wheelchair. HEENT: Pupils equal round and reactive, fundi not visualized, oral mucosa unremarkable NECK:  No jugular venous distention, waveform within normal limits, carotid upstroke brisk and symmetric, no bruits, no thyromegaly LYMPHATICS:  No cervical adenopathy LUNGS:  Clear to auscultation bilaterally HEART:  RRR.  PMI not displaced or sustained,S1 and S2 within normal limits, no S3, no S4, no clicks, no rubs, no murmurs ABD:  Flat, positive bowel sounds normal in frequency in pitch, no bruits, no rebound, no guarding, no midline pulsatile mass, no hepatomegaly, no splenomegaly EXT:  2 plus pulses throughout, no edema, no cyanosis no clubbing SKIN:  No rashes no nodules NEURO:  Cranial nerves II through XII grossly intact, motor grossly intact throughout PSYCH:  Cognitively intact, oriented to person place and time  EKG:  EKG is not ordered today. The ekg ordered 06/05/16 demonstrates sinus rhythm rate 71 bpm.   Recent Labs: 02/02/2017: ALT 10; Hemoglobin 13.4; Platelets 274 02/10/2017: BUN 17; Creatinine, Ser 0.89; Potassium 3.2; Sodium 137    Lipid Panel    Component Value Date/Time   CHOL 196 11/06/2014 1051   TRIG 155.0 (H) 11/06/2014 1051   HDL 50.80 11/06/2014 1051   CHOLHDL 4 11/06/2014 1051   VLDL 31.0 11/06/2014 1051   LDLCALC 114 (H)  11/06/2014 1051   LDLDIRECT 144.4 02/12/2011 1018      Wt Readings from Last 3 Encounters:  04/01/17 83 kg (183 lb)  02/05/17 85.2 kg (187 lb 12.8 oz)  01/01/17 85.3 kg (188 lb)      ASSESSMENT AND PLAN:  # CAD s/p CABG: Vanessa Nielsen has been doing well an denies chest pain.  She As previously ordered for [facility] but is unable to get up in the morning and come out for appointments due to episodes of diarrhea. For now we will cancel the test. If she needs testing in the future we will consider coronary CT-A.Continue aspirin, atorvastatin, and metoprolol.  # Hypertension: BP is a  little above goal.  She brings a log of her pressures that have been mostly in the 120s-130s.  However her blood pressure has also been in the 90s and time. Therefore, we will not make any changes at this time. NT metoprolol, amlodipine, and losartan.at goal on metoprolol amlodipine, losartan and hydrochlorothiazide.  # Hyperlipidemia: Continue atorvastatin.  She will return for lipids and a CMP.      Current medicines are reviewed at length with the patient today.  The patient does not have concerns regarding medicines.  The following changes have been made:  no change  Labs/ tests ordered today include:   Orders Placed This Encounter  Procedures  . Comprehensive metabolic panel  . Lipid panel     Disposition:   FU with Luie Laneve C. Oval Linsey, MD, [facility] in 1 year   This note was written with the assistance of speech recognition software.  Please excuse any transcriptional errors.  Signed, Arif Amendola C. Oval Linsey, MD, [facility]  04/01/2017 4:51 PM    [facility]

## 2017-04-01 NOTE — Addendum Note (Signed)
Addended by: Alvina Filbert B on: 04/01/2017 05:56 PM   Modules accepted: Orders

## 2017-04-20 DIAGNOSIS — Z85828 Personal history of other malignant neoplasm of skin: Secondary | ICD-10-CM | POA: Diagnosis not present

## 2017-04-20 DIAGNOSIS — L298 Other pruritus: Secondary | ICD-10-CM | POA: Diagnosis not present

## 2017-04-29 DIAGNOSIS — E559 Vitamin D deficiency, unspecified: Secondary | ICD-10-CM | POA: Diagnosis not present

## 2017-04-29 DIAGNOSIS — E784 Other hyperlipidemia: Secondary | ICD-10-CM | POA: Diagnosis not present

## 2017-04-29 DIAGNOSIS — I10 Essential (primary) hypertension: Secondary | ICD-10-CM | POA: Diagnosis not present

## 2017-05-22 ENCOUNTER — Other Ambulatory Visit (HOSPITAL_COMMUNITY): Payer: Self-pay | Admitting: Internal Medicine

## 2017-05-22 DIAGNOSIS — M623 Immobility syndrome (paraplegic): Secondary | ICD-10-CM

## 2017-05-22 DIAGNOSIS — Q062 Diastematomyelia: Secondary | ICD-10-CM | POA: Diagnosis not present

## 2017-05-22 DIAGNOSIS — I10 Essential (primary) hypertension: Secondary | ICD-10-CM | POA: Diagnosis not present

## 2017-05-22 DIAGNOSIS — Z85828 Personal history of other malignant neoplasm of skin: Secondary | ICD-10-CM | POA: Diagnosis not present

## 2017-05-22 DIAGNOSIS — K5909 Other constipation: Secondary | ICD-10-CM | POA: Diagnosis not present

## 2017-05-22 DIAGNOSIS — E784 Other hyperlipidemia: Secondary | ICD-10-CM | POA: Diagnosis not present

## 2017-05-22 DIAGNOSIS — R58 Hemorrhage, not elsewhere classified: Secondary | ICD-10-CM | POA: Diagnosis not present

## 2017-05-22 DIAGNOSIS — Z0000 Encounter for general adult medical examination without abnormal findings: Secondary | ICD-10-CM | POA: Diagnosis not present

## 2017-05-22 DIAGNOSIS — Z6839 Body mass index (BMI) 39.0-39.9, adult: Secondary | ICD-10-CM | POA: Diagnosis not present

## 2017-05-22 DIAGNOSIS — Z23 Encounter for immunization: Secondary | ICD-10-CM | POA: Diagnosis not present

## 2017-05-22 DIAGNOSIS — I25728 Atherosclerosis of autologous artery coronary artery bypass graft(s) with other forms of angina pectoris: Secondary | ICD-10-CM | POA: Diagnosis not present

## 2017-05-22 DIAGNOSIS — M859 Disorder of bone density and structure, unspecified: Secondary | ICD-10-CM | POA: Diagnosis not present

## 2017-05-22 DIAGNOSIS — E876 Hypokalemia: Secondary | ICD-10-CM | POA: Diagnosis not present

## 2017-05-22 DIAGNOSIS — Z1389 Encounter for screening for other disorder: Secondary | ICD-10-CM | POA: Diagnosis not present

## 2017-05-25 ENCOUNTER — Inpatient Hospital Stay (HOSPITAL_COMMUNITY): Admission: RE | Admit: 2017-05-25 | Payer: Medicare Other | Source: Ambulatory Visit

## 2017-05-27 ENCOUNTER — Other Ambulatory Visit: Payer: Self-pay

## 2017-05-28 ENCOUNTER — Ambulatory Visit (HOSPITAL_COMMUNITY)
Admission: RE | Admit: 2017-05-28 | Discharge: 2017-05-28 | Disposition: A | Discharge status: Home / Self Care | Payer: Medicare Other | Source: Ambulatory Visit | Attending: Vascular Surgery | Admitting: Vascular Surgery

## 2017-05-28 DIAGNOSIS — M623 Immobility syndrome (paraplegic): Secondary | ICD-10-CM | POA: Insufficient documentation

## 2017-05-28 DIAGNOSIS — R58 Hemorrhage, not elsewhere classified: Secondary | ICD-10-CM | POA: Insufficient documentation

## 2017-06-12 ENCOUNTER — Other Ambulatory Visit: Payer: Self-pay | Admitting: Cardiovascular Disease

## 2017-06-12 NOTE — Telephone Encounter (Signed)
Please review for refill, thanks ! 

## 2017-09-02 ENCOUNTER — Other Ambulatory Visit: Payer: Self-pay | Admitting: Cardiovascular Disease

## 2017-09-02 NOTE — Telephone Encounter (Signed)
Please review for refill, Thanks !  

## 2017-09-12 ENCOUNTER — Other Ambulatory Visit: Payer: Self-pay | Admitting: Cardiovascular Disease

## 2017-09-14 NOTE — Telephone Encounter (Signed)
Please review for refill, Thanks !  

## 2017-09-23 DIAGNOSIS — L509 Urticaria, unspecified: Secondary | ICD-10-CM | POA: Diagnosis not present

## 2017-10-01 DIAGNOSIS — K5900 Constipation, unspecified: Secondary | ICD-10-CM | POA: Diagnosis not present

## 2017-10-03 ENCOUNTER — Other Ambulatory Visit: Payer: Self-pay | Admitting: Cardiovascular Disease

## 2017-10-05 NOTE — Telephone Encounter (Signed)
Refill Request.  

## 2017-10-21 DIAGNOSIS — E876 Hypokalemia: Secondary | ICD-10-CM | POA: Diagnosis not present

## 2017-10-21 DIAGNOSIS — I119 Hypertensive heart disease without heart failure: Secondary | ICD-10-CM | POA: Diagnosis not present

## 2017-10-21 DIAGNOSIS — E7800 Pure hypercholesterolemia, unspecified: Secondary | ICD-10-CM | POA: Diagnosis not present

## 2017-10-21 LAB — COMPREHENSIVE METABOLIC PANEL
ALT: 8 IU/L (ref 0–32)
AST: 10 IU/L (ref 0–40)
Albumin/Globulin Ratio: 1.5 (ref 1.2–2.2)
Albumin: 3.5 g/dL (ref 3.5–4.7)
Alkaline Phosphatase: 87 IU/L (ref 39–117)
BUN/Creatinine Ratio: 19 (ref 12–28)
BUN: 18 mg/dL (ref 8–27)
Bilirubin Total: 0.6 mg/dL (ref 0.0–1.2)
CALCIUM: 8.6 mg/dL — AB (ref 8.7–10.3)
CO2: 27 mmol/L (ref 20–29)
Chloride: 99 mmol/L (ref 96–106)
Creatinine, Ser: 0.95 mg/dL (ref 0.57–1.00)
GFR, EST AFRICAN AMERICAN: 65 mL/min/{1.73_m2} (ref >59)
GFR, EST NON AFRICAN AMERICAN: 56 mL/min/{1.73_m2} — AB (ref >59)
Globulin, Total: 2.4 g/dL (ref 1.5–4.5)
Glucose: 97 mg/dL (ref 65–99)
POTASSIUM: 3.7 mmol/L (ref 3.5–5.2)
Sodium: 142 mmol/L (ref 134–144)
TOTAL PROTEIN: 5.9 g/dL — AB (ref 6.0–8.5)

## 2017-10-21 LAB — LIPID PANEL
CHOLESTEROL TOTAL: 195 mg/dL (ref 100–199)
Chol/HDL Ratio: 4 ratio (ref 0.0–4.4)
HDL: 49 mg/dL (ref >39)
LDL CALC: 114 mg/dL — AB (ref 0–99)
TRIGLYCERIDES: 158 mg/dL — AB (ref 0–149)
VLDL Cholesterol Cal: 32 mg/dL (ref 5–40)

## 2017-10-28 DIAGNOSIS — M6289 Other specified disorders of muscle: Secondary | ICD-10-CM | POA: Diagnosis not present

## 2017-10-28 DIAGNOSIS — K5901 Slow transit constipation: Secondary | ICD-10-CM | POA: Diagnosis not present

## 2017-10-28 DIAGNOSIS — R159 Full incontinence of feces: Secondary | ICD-10-CM | POA: Diagnosis not present

## 2017-11-11 ENCOUNTER — Telehealth: Payer: Self-pay | Admitting: Cardiovascular Disease

## 2017-11-11 NOTE — Telephone Encounter (Signed)
New Message ° ° °Patient is returning call in reference to labs. Please call to discuss.  °

## 2017-11-11 NOTE — Telephone Encounter (Signed)
Left message to call back  

## 2017-11-12 ENCOUNTER — Telehealth: Payer: Self-pay | Admitting: Cardiovascular Disease

## 2017-11-12 DIAGNOSIS — E7800 Pure hypercholesterolemia, unspecified: Secondary | ICD-10-CM

## 2017-11-12 DIAGNOSIS — Z5181 Encounter for therapeutic drug level monitoring: Secondary | ICD-10-CM

## 2017-11-12 DIAGNOSIS — I119 Hypertensive heart disease without heart failure: Secondary | ICD-10-CM

## 2017-11-12 NOTE — Telephone Encounter (Signed)
New message ° °Pt verbalized that she is returning call for RN °

## 2017-11-13 DIAGNOSIS — R350 Frequency of micturition: Secondary | ICD-10-CM | POA: Diagnosis not present

## 2017-11-13 MED ORDER — ATORVASTATIN CALCIUM 40 MG PO TABS: 40.0000 mg | ORAL_TABLET | Freq: Every day | ORAL | 3 refills | Status: DC

## 2017-11-13 NOTE — Telephone Encounter (Signed)
Advised patient of lab results  Per patient she is only taking Atorvastatin 40 mg 1/2 tablet daily. Advised to increase to full tablet daily and recheck labs 6-8 weeks, verbalized understanding

## 2017-11-13 NOTE — Telephone Encounter (Signed)
-----   Message from Skeet Latch, MD sent at 11/09/2017  9:30 AM EST ----- Normal kidney function and liver function.  Cholesterol levels are stable.  LDL should be <70 and it is 114.  Recommend increasing atorvastatin to 80 mg.  Repeat lipids and CMP in 6-8 weeks.

## 2017-11-26 ENCOUNTER — Other Ambulatory Visit: Payer: Self-pay | Admitting: Cardiovascular Disease

## 2017-11-26 NOTE — Telephone Encounter (Signed)
Refill Request.  

## 2017-11-26 NOTE — Telephone Encounter (Signed)
Please review for refill, Thanks !  

## 2017-12-03 DIAGNOSIS — H00014 Hordeolum externum left upper eyelid: Secondary | ICD-10-CM | POA: Diagnosis not present

## 2017-12-03 DIAGNOSIS — I10 Essential (primary) hypertension: Secondary | ICD-10-CM | POA: Diagnosis not present

## 2017-12-23 ENCOUNTER — Other Ambulatory Visit: Payer: Self-pay | Admitting: Internal Medicine

## 2017-12-23 DIAGNOSIS — Z139 Encounter for screening, unspecified: Secondary | ICD-10-CM

## 2018-01-13 DIAGNOSIS — K5900 Constipation, unspecified: Secondary | ICD-10-CM | POA: Diagnosis not present

## 2018-01-18 ENCOUNTER — Telehealth: Payer: Self-pay | Admitting: Cardiovascular Disease

## 2018-01-18 NOTE — Telephone Encounter (Signed)
Received records from [facility] on 01/18/18, Appt 04/01/18 @ 2:00PM. NV

## 2018-01-20 ENCOUNTER — Ambulatory Visit: Payer: Medicare Other

## 2018-02-11 ENCOUNTER — Ambulatory Visit
Admission: RE | Admit: 2018-02-11 | Discharge: 2018-02-11 | Disposition: A | Discharge status: Home / Self Care | Payer: Medicare Other | Source: Ambulatory Visit | Attending: Internal Medicine | Admitting: Internal Medicine

## 2018-02-11 DIAGNOSIS — Z1231 Encounter for screening mammogram for malignant neoplasm of breast: Secondary | ICD-10-CM | POA: Diagnosis not present

## 2018-02-11 DIAGNOSIS — Z139 Encounter for screening, unspecified: Secondary | ICD-10-CM

## 2018-03-24 ENCOUNTER — Other Ambulatory Visit: Payer: Self-pay | Admitting: Cardiovascular Disease

## 2018-03-31 DIAGNOSIS — I119 Hypertensive heart disease without heart failure: Secondary | ICD-10-CM | POA: Diagnosis not present

## 2018-03-31 DIAGNOSIS — Z5181 Encounter for therapeutic drug level monitoring: Secondary | ICD-10-CM | POA: Diagnosis not present

## 2018-03-31 DIAGNOSIS — E7800 Pure hypercholesterolemia, unspecified: Secondary | ICD-10-CM | POA: Diagnosis not present

## 2018-03-31 LAB — COMPREHENSIVE METABOLIC PANEL
ALBUMIN: 3.5 g/dL (ref 3.5–4.7)
ALK PHOS: 69 IU/L (ref 39–117)
ALT: 6 IU/L (ref 0–32)
AST: 14 IU/L (ref 0–40)
Albumin/Globulin Ratio: 1.7 (ref 1.2–2.2)
BUN / CREAT RATIO: 21 (ref 12–28)
BUN: 17 mg/dL (ref 8–27)
Bilirubin Total: 0.3 mg/dL (ref 0.0–1.2)
CO2: 29 mmol/L (ref 20–29)
CREATININE: 0.82 mg/dL (ref 0.57–1.00)
Calcium: 8.4 mg/dL — ABNORMAL LOW (ref 8.7–10.3)
Chloride: 97 mmol/L (ref 96–106)
GFR calc Af Amer: 78 mL/min/{1.73_m2} (ref >59)
GFR calc non Af Amer: 67 mL/min/{1.73_m2} (ref >59)
GLOBULIN, TOTAL: 2.1 g/dL (ref 1.5–4.5)
Glucose: 77 mg/dL (ref 65–99)
Potassium: 3.3 mmol/L — ABNORMAL LOW (ref 3.5–5.2)
SODIUM: 142 mmol/L (ref 134–144)
Total Protein: 5.6 g/dL — ABNORMAL LOW (ref 6.0–8.5)

## 2018-03-31 LAB — LIPID PANEL
Chol/HDL Ratio: 3.7 ratio (ref 0.0–4.4)
Cholesterol, Total: 203 mg/dL — ABNORMAL HIGH (ref 100–199)
HDL: 55 mg/dL (ref >39)
LDL Calculated: 116 mg/dL — ABNORMAL HIGH (ref 0–99)
Triglycerides: 160 mg/dL — ABNORMAL HIGH (ref 0–149)
VLDL CHOLESTEROL CAL: 32 mg/dL (ref 5–40)

## 2018-04-01 ENCOUNTER — Encounter: Payer: Self-pay | Admitting: Cardiovascular Disease

## 2018-04-01 ENCOUNTER — Ambulatory Visit (INDEPENDENT_AMBULATORY_CARE_PROVIDER_SITE_OTHER): Payer: Medicare Other | Admitting: Cardiovascular Disease

## 2018-04-01 VITALS — BP 142/81 | HR 70 | Ht 59.0 in | Wt 189.0 lb

## 2018-04-01 DIAGNOSIS — E7800 Pure hypercholesterolemia, unspecified: Secondary | ICD-10-CM

## 2018-04-01 DIAGNOSIS — I119 Hypertensive heart disease without heart failure: Secondary | ICD-10-CM

## 2018-04-01 DIAGNOSIS — Z951 Presence of aortocoronary bypass graft: Secondary | ICD-10-CM | POA: Diagnosis not present

## 2018-04-01 DIAGNOSIS — I209 Angina pectoris, unspecified: Secondary | ICD-10-CM

## 2018-04-01 MED ORDER — ROSUVASTATIN CALCIUM 40 MG PO TABS: 40.0000 mg | ORAL_TABLET | Freq: Every day | ORAL | 3 refills | Status: AC

## 2018-04-01 NOTE — Patient Instructions (Signed)
Medication Instructions:  .STOP ATORVASTATIN   START ROSUVASTATIN 40 MG DAILY   Labwork: FASTING LP/CMET IN 2 MONTHS  Testing/Procedures: NONE  Follow-Up: Your physician wants you to follow-up in: [place] will receive a reminder letter in the mail two months in advance. If you don't receive a letter, please call our office to schedule the follow-up appointment.  If you need a refill on your cardiac medications before your next appointment, please call your pharmacy.

## 2018-04-01 NOTE — Progress Notes (Signed)
Cardiology Office Note   Date:  04/01/2018   ID:  Vanessa Nielsen, DOB [birth year], MRN [id number]  PCP:  Velna Hatchet, MD  Cardiologist:   Skeet Latch, MD   Chief Complaint  Patient presents with  . Follow-up     History of Present Illness: Vanessa Nielsen is a 84 y.o. female with hypertension, hyperlipidemia, CAD s/p CABG (LIMA-->LAD 2008), Spina bifida, who presents for follow up. Vanessa Nielsen had a nuclear stress test in 2012 and a Lexiscan Myoview 11/21/14 that were negative for ischemia.  LVEF was 62%. She reported chest pain and was referred for a Lexiscan Myoview but has not completed it.    Since her last appointment LDL was above goal so she was advised to increase atorvastatin to 80 mg.  However she was unaware and did not make that change.  She has continued to take atorvastatin 40mg  daily.  She continues to have daily diarrhea that limits her ability to leave the house.  She has angina once every 3-4 months and it lasts for a few moments.  There is no associated shortness of breath and it occurs randomly.  She is unable to get much exercise and no longer walks.  She denies lower extremity edema, orthopnea or PND.  She reportedly checks her BP BID at home and it has been well-controlled.     Past Medical History:  Diagnosis Date  . Allergic rhinitis   . Coronary artery disease    2v CABG @ [facility]  . Hypercholesteremia   . Hyperlipidemia   . Hypertension   . Iatrogenic Cushing's disease ([place])   . Multiple allergies    induced by pain: swelling in UE/face/torso, nausea, hives followed by hypotension unless tx with high dose steroids  . Osteoarthritis   . Scoliosis   . Spina bifida     Past Surgical History:  Procedure Laterality Date  . ABDOMINAL HYSTERECTOMY  1981 or 82  . ANAL RECTAL MANOMETRY N/A 02/13/2014   Procedure: ANAL RECTAL MANOMETRY;  Surgeon: Arta Silence, MD;  Location: [facility];  Service: Endoscopy;  Laterality: N/A;  . APPENDECTOMY   1960  . CARDIAC CATHETERIZATION  11/2006  . CARDIOVASCULAR STRESS TEST  09/19/2010  . CHOLECYSTECTOMY  2011  . CHOLECYSTECTOMY, LAPAROSCOPIC  05/27/2010  . CORONARY ANGIOPLASTY  11/2006   left anterior descending vessel  . Hardware removed in spine due to loos screws  1997  . REDUCTION MAMMAPLASTY Bilateral 1998  . Surgery for scoliosis  1995  . TONSILLECTOMY  1943  . TOTAL KNEE ARTHROPLASTY  09/2010   right     Current Outpatient Medications  Medication Sig Dispense Refill  . amLODipine (NORVASC) 5 MG tablet TAKE 1 TABLET BY MOUTH IF SYSTOLIC BLOOD PRESSURE IS ABOVE 150 90 tablet 3  . aspirin 81 MG EC tablet Take 81 mg by mouth daily.      Marland Kitchen atorvastatin (LIPITOR) 40 MG tablet Take 1 tablet (40 mg total) by mouth daily. 90 tablet 3  . fluticasone (FLONASE) 50 MCG/ACT nasal spray USE 2 SPRAYS INTO EACH NOSTRIL EVERY DAY 16 g 3  . LINZESS 290 MCG CAPS capsule Take 290 mcg by mouth daily.  1  . losartan-hydrochlorothiazide (HYZAAR) 100-25 MG tablet TAKE 1 TABLET BY MOUTH DAILY. PLEASE CONTACT OFFICE TO SCHEDULE FOLLOW UP APPT. [phone] 30 tablet 6  . metoprolol tartrate (LOPRESSOR) 50 MG tablet TAKE 0.5 TABLETS (25 MG TOTAL) BY MOUTH 2 (TWO) TIMES DAILY. 90 tablet 2  . nitroGLYCERIN (NITROSTAT)  0.4 MG SL tablet PLACE 1 TABLET (0.4 MG TOTAL) UNDER THE TONGUE EVERY 5 (FIVE) MINUTES AS NEEDED FOR CHEST PAIN. 25 tablet 3  . ondansetron (ZOFRAN) 8 MG tablet Take 8 mg by mouth every 8 (eight) hours as needed for nausea or vomiting.     . polyethylene glycol (MIRALAX / GLYCOLAX) packet Take 17 g by mouth 2 (two) times daily.    . potassium chloride (MICRO-K) 10 MEQ CR capsule TAKE 2 CAPSULES BY MOUTH TWICE A DAY 360 capsule 3  . promethazine (PHENERGAN) 25 MG tablet Take 25 mg by mouth every 6 (six) hours as needed for nausea or vomiting.     No current facility-administered medications for this visit.     Allergies:   Cardura [doxazosin mesylate]; Clarithromycin; Erythromycin; Guaifenesin;  and Ketorolac tromethamine    Social History:  The patient  reports that she has never smoked. She has never used smokeless tobacco. She reports that she does not drink alcohol.   Family History:  The patient's family history includes Arthritis in her father and mother; Cancer (age of onset: 57) in her father; Heart disease in her other; Hypertension in her maternal aunt and other.    ROS:  Please see the history of present illness.   Otherwise, review of systems are positive for none   All other systems are reviewed and negative.    PHYSICAL EXAM: VS:  BP (!) 142/81   Pulse 70   Ht 4\' 11"  (1.499 m)   Wt 189 lb (85.7 kg)   BMI 38.17 kg/m  , BMI Body mass index is 38.17 kg/m. GENERAL:  Well appearing.  Seated in wheelchair. HEENT: Pupils equal round and reactive, fundi not visualized, oral mucosa unremarkable NECK:  No jugular venous distention, waveform within normal limits, carotid upstroke brisk and symmetric, no bruits, no thyromegaly LYMPHATICS:  No cervical adenopathy LUNGS:  Clear to auscultation bilaterally HEART:  RRR.  PMI not displaced or sustained,S1 and S2 within normal limits, no S3, no S4, no clicks, no rubs, I/VI systolic murmur at the LUSB ABD:  Flat, positive bowel sounds normal in frequency in pitch, no bruits, no rebound, no guarding, no midline pulsatile mass, no hepatomegaly, no splenomegaly EXT:  2 plus pulses throughout, no edema, no cyanosis no clubbing SKIN:  No rashes no nodules NEURO:  Cranial nerves II through XII grossly intact, motor grossly intact throughout PSYCH:  Cognitively intact, oriented to person place and time  EKG:  EKG is ordered today. The ekg ordered 06/05/16 demonstrates sinus rhythm rate 71 bpm. 04/01/18: Sinus rhythm.  Rate 70 bpm.  Low voltage.   Recent Labs: 03/31/2018: ALT 6; BUN 17; Creatinine, Ser 0.82; Potassium 3.3; Sodium 142    Lipid Panel    Component Value Date/Time   CHOL 203 (H) 03/31/2018 0954   TRIG 160 (H)  03/31/2018 0954   HDL 55 03/31/2018 0954   CHOLHDL 3.7 03/31/2018 0954   CHOLHDL 4 11/06/2014 1051   VLDL 31.0 11/06/2014 1051   LDLCALC 116 (H) 03/31/2018 0954   LDLDIRECT 144.4 02/12/2011 1018      Wt Readings from Last 3 Encounters:  04/01/18 189 lb (85.7 kg)  04/01/17 183 lb (83 kg)  02/05/17 187 lb 12.8 oz (85.2 kg)      ASSESSMENT AND PLAN:  # CAD s/p CABG: # Hyperlipidemia:  Vanessa Nielsen is doing well. She has occasional chest pain 3-4 times per year that is not exertional.  It responds to nitroglycerin.  It is  unclear whether this is ischemic.  We discussed a trial of Imdur but it isn't bothersome enough for her to want to take another daily medication.  Continue aspirin and metoprolol.  Switch atorvastatin to rosuvastatin.  Her lipids are worse on atorvastatin despite taking the medication.  Given her constant diarrhea I wonder if there may be an absorption issue.  We will try rosuvastatin 40mg .  Repeat lipids and CMP in 6-8 weeks. Goal LDL <70.  # Hypertension: BP is above goal.  She will continue to track at home and bring to follow up.  Current medicines are reviewed at length with the patient today.  The patient does not have concerns regarding medicines.  The following changes have been made:  no change  Labs/ tests ordered today include:   No orders of the defined types were placed in this encounter.    Disposition:   FU with Donna Silverman C. Oval Linsey, MD, [facility] in 6 months.     Signed, Miliana Gangwer C. Oval Linsey, MD, [facility]  04/01/2018 2:15 PM    [facility]

## 2018-04-05 ENCOUNTER — Encounter: Payer: Self-pay | Admitting: Cardiovascular Disease

## 2018-04-08 ENCOUNTER — Telehealth: Payer: Self-pay | Admitting: *Deleted

## 2018-04-08 DIAGNOSIS — D692 Other nonthrombocytopenic purpura: Secondary | ICD-10-CM | POA: Diagnosis not present

## 2018-04-08 DIAGNOSIS — L308 Other specified dermatitis: Secondary | ICD-10-CM | POA: Diagnosis not present

## 2018-04-08 DIAGNOSIS — Z85828 Personal history of other malignant neoplasm of skin: Secondary | ICD-10-CM | POA: Diagnosis not present

## 2018-04-08 MED ORDER — POTASSIUM CHLORIDE ER 10 MEQ PO CPCR: ORAL_CAPSULE | ORAL | 3 refills | Status: AC

## 2018-04-08 NOTE — Telephone Encounter (Signed)
-----   Message from Skeet Latch, MD sent at 04/05/2018  8:24 AM EDT ----- Cholesterol levels are stable but not at goal.  Recommend switching to rosuvastatin as planned.

## 2018-04-08 NOTE — Telephone Encounter (Signed)
Discussed 3.3 potasium with Dr Oval Linsey. Will increase K+ 10 meq to 4 twice a day. Advised patient, verbalized understanding.

## 2018-05-04 DIAGNOSIS — K5901 Slow transit constipation: Secondary | ICD-10-CM | POA: Diagnosis not present

## 2018-05-06 ENCOUNTER — Other Ambulatory Visit: Payer: Self-pay | Admitting: Cardiovascular Disease

## 2018-05-26 ENCOUNTER — Encounter: Payer: Self-pay | Admitting: Cardiovascular Disease

## 2018-05-26 DIAGNOSIS — E559 Vitamin D deficiency, unspecified: Secondary | ICD-10-CM | POA: Diagnosis not present

## 2018-05-26 DIAGNOSIS — I10 Essential (primary) hypertension: Secondary | ICD-10-CM | POA: Diagnosis not present

## 2018-05-26 DIAGNOSIS — E7849 Other hyperlipidemia: Secondary | ICD-10-CM | POA: Diagnosis not present

## 2018-06-02 DIAGNOSIS — Z0000 Encounter for general adult medical examination without abnormal findings: Secondary | ICD-10-CM | POA: Diagnosis not present

## 2018-06-02 DIAGNOSIS — Z23 Encounter for immunization: Secondary | ICD-10-CM | POA: Diagnosis not present

## 2018-06-02 DIAGNOSIS — R946 Abnormal results of thyroid function studies: Secondary | ICD-10-CM | POA: Diagnosis not present

## 2018-06-02 DIAGNOSIS — E559 Vitamin D deficiency, unspecified: Secondary | ICD-10-CM | POA: Diagnosis not present

## 2018-06-02 DIAGNOSIS — Z6839 Body mass index (BMI) 39.0-39.9, adult: Secondary | ICD-10-CM | POA: Diagnosis not present

## 2018-06-02 DIAGNOSIS — E876 Hypokalemia: Secondary | ICD-10-CM | POA: Diagnosis not present

## 2018-06-02 DIAGNOSIS — E7849 Other hyperlipidemia: Secondary | ICD-10-CM | POA: Diagnosis not present

## 2018-06-02 DIAGNOSIS — Z1389 Encounter for screening for other disorder: Secondary | ICD-10-CM | POA: Diagnosis not present

## 2018-06-02 DIAGNOSIS — D692 Other nonthrombocytopenic purpura: Secondary | ICD-10-CM | POA: Diagnosis not present

## 2018-06-02 DIAGNOSIS — R1084 Generalized abdominal pain: Secondary | ICD-10-CM | POA: Diagnosis not present

## 2018-06-02 DIAGNOSIS — I25728 Atherosclerosis of autologous artery coronary artery bypass graft(s) with other forms of angina pectoris: Secondary | ICD-10-CM | POA: Diagnosis not present

## 2018-06-02 DIAGNOSIS — R7301 Impaired fasting glucose: Secondary | ICD-10-CM | POA: Diagnosis not present

## 2018-06-02 DIAGNOSIS — I10 Essential (primary) hypertension: Secondary | ICD-10-CM | POA: Diagnosis not present

## 2018-06-02 DIAGNOSIS — R82998 Other abnormal findings in urine: Secondary | ICD-10-CM | POA: Diagnosis not present

## 2018-06-07 DIAGNOSIS — K5900 Constipation, unspecified: Secondary | ICD-10-CM | POA: Diagnosis not present

## 2018-06-07 DIAGNOSIS — R140 Abdominal distension (gaseous): Secondary | ICD-10-CM | POA: Diagnosis not present

## 2018-06-07 DIAGNOSIS — R110 Nausea: Secondary | ICD-10-CM | POA: Diagnosis not present

## 2018-06-10 DIAGNOSIS — Z1212 Encounter for screening for malignant neoplasm of rectum: Secondary | ICD-10-CM | POA: Diagnosis not present

## 2018-07-19 DIAGNOSIS — R350 Frequency of micturition: Secondary | ICD-10-CM | POA: Diagnosis not present

## 2018-08-20 ENCOUNTER — Other Ambulatory Visit: Payer: Self-pay | Admitting: Cardiovascular Disease

## 2018-08-27 DIAGNOSIS — R350 Frequency of micturition: Secondary | ICD-10-CM | POA: Diagnosis not present

## 2018-08-30 DIAGNOSIS — S8002XA Contusion of left knee, initial encounter: Secondary | ICD-10-CM | POA: Diagnosis not present

## 2018-09-03 ENCOUNTER — Other Ambulatory Visit: Payer: Self-pay | Admitting: Cardiovascular Disease

## 2018-09-14 ENCOUNTER — Encounter (HOSPITAL_COMMUNITY): Payer: Self-pay

## 2018-09-14 ENCOUNTER — Inpatient Hospital Stay (HOSPITAL_COMMUNITY)
Admission: EM | Admit: 2018-09-14 | Discharge: 2018-10-16 | DRG: 291 | Disposition: E | Payer: Medicare Other | Attending: Internal Medicine | Admitting: Internal Medicine

## 2018-09-14 ENCOUNTER — Other Ambulatory Visit: Payer: Self-pay

## 2018-09-14 ENCOUNTER — Emergency Department (HOSPITAL_COMMUNITY): Payer: Medicare Other

## 2018-09-14 DIAGNOSIS — J9600 Acute respiratory failure, unspecified whether with hypoxia or hypercapnia: Secondary | ICD-10-CM | POA: Diagnosis present

## 2018-09-14 DIAGNOSIS — W19XXXA Unspecified fall, initial encounter: Secondary | ICD-10-CM | POA: Diagnosis present

## 2018-09-14 DIAGNOSIS — R0902 Hypoxemia: Secondary | ICD-10-CM | POA: Diagnosis not present

## 2018-09-14 DIAGNOSIS — Z791 Long term (current) use of non-steroidal anti-inflammatories (NSAID): Secondary | ICD-10-CM

## 2018-09-14 DIAGNOSIS — Z66 Do not resuscitate: Secondary | ICD-10-CM | POA: Diagnosis present

## 2018-09-14 DIAGNOSIS — I248 Other forms of acute ischemic heart disease: Secondary | ICD-10-CM | POA: Diagnosis present

## 2018-09-14 DIAGNOSIS — R110 Nausea: Secondary | ICD-10-CM | POA: Diagnosis not present

## 2018-09-14 DIAGNOSIS — M5489 Other dorsalgia: Secondary | ICD-10-CM | POA: Diagnosis not present

## 2018-09-14 DIAGNOSIS — Z79899 Other long term (current) drug therapy: Secondary | ICD-10-CM

## 2018-09-14 DIAGNOSIS — R296 Repeated falls: Secondary | ICD-10-CM | POA: Diagnosis present

## 2018-09-14 DIAGNOSIS — I444 Left anterior fascicular block: Secondary | ICD-10-CM | POA: Diagnosis present

## 2018-09-14 DIAGNOSIS — I4891 Unspecified atrial fibrillation: Secondary | ICD-10-CM | POA: Diagnosis not present

## 2018-09-14 DIAGNOSIS — R05 Cough: Secondary | ICD-10-CM | POA: Diagnosis not present

## 2018-09-14 DIAGNOSIS — R531 Weakness: Secondary | ICD-10-CM | POA: Diagnosis not present

## 2018-09-14 DIAGNOSIS — Z9089 Acquired absence of other organs: Secondary | ICD-10-CM

## 2018-09-14 DIAGNOSIS — Z888 Allergy status to other drugs, medicaments and biological substances status: Secondary | ICD-10-CM

## 2018-09-14 DIAGNOSIS — E242 Drug-induced Cushing's syndrome: Secondary | ICD-10-CM | POA: Diagnosis not present

## 2018-09-14 DIAGNOSIS — F0390 Unspecified dementia without behavioral disturbance: Secondary | ICD-10-CM | POA: Diagnosis present

## 2018-09-14 DIAGNOSIS — I272 Pulmonary hypertension, unspecified: Secondary | ICD-10-CM | POA: Diagnosis present

## 2018-09-14 DIAGNOSIS — I5031 Acute diastolic (congestive) heart failure: Secondary | ICD-10-CM | POA: Diagnosis not present

## 2018-09-14 DIAGNOSIS — Z8249 Family history of ischemic heart disease and other diseases of the circulatory system: Secondary | ICD-10-CM

## 2018-09-14 DIAGNOSIS — Z9861 Coronary angioplasty status: Secondary | ICD-10-CM

## 2018-09-14 DIAGNOSIS — I509 Heart failure, unspecified: Secondary | ICD-10-CM

## 2018-09-14 DIAGNOSIS — E876 Hypokalemia: Secondary | ICD-10-CM | POA: Diagnosis not present

## 2018-09-14 DIAGNOSIS — E785 Hyperlipidemia, unspecified: Secondary | ICD-10-CM | POA: Diagnosis present

## 2018-09-14 DIAGNOSIS — Z881 Allergy status to other antibiotic agents status: Secondary | ICD-10-CM

## 2018-09-14 DIAGNOSIS — N182 Chronic kidney disease, stage 2 (mild): Secondary | ICD-10-CM | POA: Diagnosis present

## 2018-09-14 DIAGNOSIS — J9601 Acute respiratory failure with hypoxia: Secondary | ICD-10-CM | POA: Diagnosis present

## 2018-09-14 DIAGNOSIS — J9691 Respiratory failure, unspecified with hypoxia: Secondary | ICD-10-CM

## 2018-09-14 DIAGNOSIS — Z7951 Long term (current) use of inhaled steroids: Secondary | ICD-10-CM

## 2018-09-14 DIAGNOSIS — Z8261 Family history of arthritis: Secondary | ICD-10-CM

## 2018-09-14 DIAGNOSIS — Z951 Presence of aortocoronary bypass graft: Secondary | ICD-10-CM

## 2018-09-14 DIAGNOSIS — E7800 Pure hypercholesterolemia, unspecified: Secondary | ICD-10-CM | POA: Diagnosis present

## 2018-09-14 DIAGNOSIS — R509 Fever, unspecified: Secondary | ICD-10-CM

## 2018-09-14 DIAGNOSIS — N179 Acute kidney failure, unspecified: Secondary | ICD-10-CM | POA: Diagnosis present

## 2018-09-14 DIAGNOSIS — R0602 Shortness of breath: Secondary | ICD-10-CM | POA: Diagnosis not present

## 2018-09-14 DIAGNOSIS — Z90710 Acquired absence of both cervix and uterus: Secondary | ICD-10-CM

## 2018-09-14 DIAGNOSIS — M1990 Unspecified osteoarthritis, unspecified site: Secondary | ICD-10-CM | POA: Diagnosis present

## 2018-09-14 DIAGNOSIS — I130 Hypertensive heart and chronic kidney disease with heart failure and stage 1 through stage 4 chronic kidney disease, or unspecified chronic kidney disease: Secondary | ICD-10-CM | POA: Diagnosis not present

## 2018-09-14 DIAGNOSIS — I340 Nonrheumatic mitral (valve) insufficiency: Secondary | ICD-10-CM | POA: Diagnosis present

## 2018-09-14 DIAGNOSIS — Z9049 Acquired absence of other specified parts of digestive tract: Secondary | ICD-10-CM

## 2018-09-14 DIAGNOSIS — Z96651 Presence of right artificial knee joint: Secondary | ICD-10-CM | POA: Diagnosis present

## 2018-09-14 DIAGNOSIS — I480 Paroxysmal atrial fibrillation: Secondary | ICD-10-CM | POA: Diagnosis present

## 2018-09-14 DIAGNOSIS — Z7982 Long term (current) use of aspirin: Secondary | ICD-10-CM

## 2018-09-14 DIAGNOSIS — Y848 Other medical procedures as the cause of abnormal reaction of the patient, or of later complication, without mention of misadventure at the time of the procedure: Secondary | ICD-10-CM | POA: Diagnosis present

## 2018-09-14 DIAGNOSIS — I4892 Unspecified atrial flutter: Secondary | ICD-10-CM | POA: Diagnosis present

## 2018-09-14 DIAGNOSIS — Z515 Encounter for palliative care: Secondary | ICD-10-CM | POA: Diagnosis present

## 2018-09-14 DIAGNOSIS — I2510 Atherosclerotic heart disease of native coronary artery without angina pectoris: Secondary | ICD-10-CM | POA: Diagnosis present

## 2018-09-14 DIAGNOSIS — Z6837 Body mass index (BMI) 37.0-37.9, adult: Secondary | ICD-10-CM

## 2018-09-14 DIAGNOSIS — M419 Scoliosis, unspecified: Secondary | ICD-10-CM | POA: Diagnosis present

## 2018-09-14 DIAGNOSIS — R9431 Abnormal electrocardiogram [ECG] [EKG]: Secondary | ICD-10-CM | POA: Diagnosis not present

## 2018-09-14 DIAGNOSIS — T82848A Pain from vascular prosthetic devices, implants and grafts, initial encounter: Secondary | ICD-10-CM | POA: Diagnosis not present

## 2018-09-14 DIAGNOSIS — G4733 Obstructive sleep apnea (adult) (pediatric): Secondary | ICD-10-CM | POA: Diagnosis present

## 2018-09-14 DIAGNOSIS — G9389 Other specified disorders of brain: Secondary | ICD-10-CM | POA: Diagnosis not present

## 2018-09-14 DIAGNOSIS — J9811 Atelectasis: Secondary | ICD-10-CM | POA: Diagnosis present

## 2018-09-14 DIAGNOSIS — Z886 Allergy status to analgesic agent status: Secondary | ICD-10-CM

## 2018-09-14 DIAGNOSIS — F419 Anxiety disorder, unspecified: Secondary | ICD-10-CM | POA: Diagnosis present

## 2018-09-14 DIAGNOSIS — G9340 Encephalopathy, unspecified: Secondary | ICD-10-CM | POA: Diagnosis present

## 2018-09-14 DIAGNOSIS — Q057 Lumbar spina bifida without hydrocephalus: Secondary | ICD-10-CM

## 2018-09-14 LAB — BLOOD GAS, ARTERIAL
Acid-Base Excess: 1.3 mmol/L (ref 0.0–2.0)
Bicarbonate: 26.7 mmol/L (ref 20.0–28.0)
Drawn by: 295031
FIO2: 100
O2 Saturation: 97.4 %
Patient temperature: 99.4
pCO2 arterial: 48.5 mmHg — ABNORMAL HIGH (ref 32.0–48.0)
pH, Arterial: 7.361 (ref 7.350–7.450)
pO2, Arterial: 112 mmHg — ABNORMAL HIGH (ref 83.0–108.0)

## 2018-09-14 LAB — URINALYSIS, ROUTINE W REFLEX MICROSCOPIC
BILIRUBIN URINE: NEGATIVE
Glucose, UA: 50 mg/dL — AB
HGB URINE DIPSTICK: NEGATIVE
Ketones, ur: 5 mg/dL — AB
Leukocytes, UA: NEGATIVE
Nitrite: NEGATIVE
Protein, ur: 100 mg/dL — AB
Specific Gravity, Urine: 1.021 (ref 1.005–1.030)
pH: 5 (ref 5.0–8.0)

## 2018-09-14 LAB — COMPREHENSIVE METABOLIC PANEL
ALT: 44 U/L (ref 0–44)
ANION GAP: 12 (ref 5–15)
AST: 47 U/L — ABNORMAL HIGH (ref 15–41)
Albumin: 3.1 g/dL — ABNORMAL LOW (ref 3.5–5.0)
Alkaline Phosphatase: 80 U/L (ref 38–126)
BUN: 32 mg/dL — ABNORMAL HIGH (ref 8–23)
CO2: 26 mmol/L (ref 22–32)
Calcium: 7.9 mg/dL — ABNORMAL LOW (ref 8.9–10.3)
Chloride: 103 mmol/L (ref 98–111)
Creatinine, Ser: 1.98 mg/dL — ABNORMAL HIGH (ref 0.44–1.00)
GFR calc Af Amer: 27 mL/min — ABNORMAL LOW (ref >60)
GFR calc non Af Amer: 23 mL/min — ABNORMAL LOW (ref >60)
Glucose, Bld: 109 mg/dL — ABNORMAL HIGH (ref 70–99)
Potassium: 3.7 mmol/L (ref 3.5–5.1)
Sodium: 141 mmol/L (ref 135–145)
Total Bilirubin: 0.8 mg/dL (ref 0.3–1.2)
Total Protein: 5.4 g/dL — ABNORMAL LOW (ref 6.5–8.1)

## 2018-09-14 LAB — I-STAT CG4 LACTIC ACID, ED
Lactic Acid, Venous: 1.03 mmol/L (ref 0.5–1.9)
Lactic Acid, Venous: 1.11 mmol/L (ref 0.5–1.9)

## 2018-09-14 LAB — BRAIN NATRIURETIC PEPTIDE: B Natriuretic Peptide: 1489 pg/mL — ABNORMAL HIGH (ref 0.0–100.0)

## 2018-09-14 LAB — CBC WITH DIFFERENTIAL/PLATELET
Abs Immature Granulocytes: 0.08 10*3/uL — ABNORMAL HIGH (ref 0.00–0.07)
BASOS ABS: 0.1 10*3/uL (ref 0.0–0.1)
Basophils Relative: 1 %
Eosinophils Absolute: 0.2 10*3/uL (ref 0.0–0.5)
Eosinophils Relative: 2 %
HCT: 43.3 % (ref 36.0–46.0)
Hemoglobin: 13 g/dL (ref 12.0–15.0)
Immature Granulocytes: 1 %
Lymphocytes Relative: 15 %
Lymphs Abs: 1.4 10*3/uL (ref 0.7–4.0)
MCH: 27.7 pg (ref 26.0–34.0)
MCHC: 30 g/dL (ref 30.0–36.0)
MCV: 92.1 fL (ref 80.0–100.0)
Monocytes Absolute: 0.8 10*3/uL (ref 0.1–1.0)
Monocytes Relative: 9 %
NRBC: 0.2 % (ref 0.0–0.2)
Neutro Abs: 6.8 10*3/uL (ref 1.7–7.7)
Neutrophils Relative %: 72 %
Platelets: 312 10*3/uL (ref 150–400)
RBC: 4.7 MIL/uL (ref 3.87–5.11)
RDW: 15.8 % — ABNORMAL HIGH (ref 11.5–15.5)
WBC: 9.2 10*3/uL (ref 4.0–10.5)

## 2018-09-14 LAB — TROPONIN I
Troponin I: 0.17 ng/mL (ref <0.03)
Troponin I: 0.15 ng/mL (ref <0.03)

## 2018-09-14 LAB — MRSA PCR SCREENING: MRSA by PCR: NEGATIVE

## 2018-09-14 MED ORDER — ROSUVASTATIN CALCIUM 20 MG PO TABS
40.0000 mg | ORAL_TABLET | Freq: Every day | ORAL | Status: DC
  Administered 2018-09-15 – 2018-09-16 (×2): 40 mg via ORAL
  Filled 2018-09-14 (×3): qty 2

## 2018-09-14 MED ORDER — ASPIRIN 81 MG PO CHEW
324.0000 mg | CHEWABLE_TABLET | Freq: Once | ORAL | Status: AC
  Administered 2018-09-14: 324 mg via ORAL
  Filled 2018-09-14: qty 4

## 2018-09-14 MED ORDER — SODIUM CHLORIDE 0.9% FLUSH
3.0000 mL | Freq: Two times a day (BID) | INTRAVENOUS | Status: DC
  Administered 2018-09-14 – 2018-09-18 (×8): 3 mL via INTRAVENOUS

## 2018-09-14 MED ORDER — ONDANSETRON HCL 4 MG/2ML IJ SOLN
4.0000 mg | Freq: Four times a day (QID) | INTRAMUSCULAR | Status: DC | PRN
  Administered 2018-09-17 – 2018-09-19 (×2): 4 mg via INTRAVENOUS
  Filled 2018-09-14 (×2): qty 2

## 2018-09-14 MED ORDER — FUROSEMIDE 10 MG/ML IJ SOLN
80.0000 mg | Freq: Two times a day (BID) | INTRAMUSCULAR | Status: DC
  Administered 2018-09-14 – 2018-09-18 (×8): 80 mg via INTRAVENOUS
  Filled 2018-09-14 (×8): qty 8

## 2018-09-14 MED ORDER — SODIUM CHLORIDE 0.9 % IV SOLN
250.0000 mL | INTRAVENOUS | Status: DC | PRN
  Administered 2018-09-16 – 2018-09-17 (×2): 250 mL via INTRAVENOUS

## 2018-09-14 MED ORDER — ASPIRIN EC 81 MG PO TBEC
81.0000 mg | DELAYED_RELEASE_TABLET | Freq: Every day | ORAL | Status: DC
  Administered 2018-09-15: 81 mg via ORAL
  Filled 2018-09-14: qty 1

## 2018-09-14 MED ORDER — ACETAMINOPHEN 650 MG RE SUPP: 650.0000 mg | Freq: Four times a day (QID) | RECTAL | Status: DC | PRN

## 2018-09-14 MED ORDER — FUROSEMIDE 10 MG/ML IJ SOLN
80.0000 mg | Freq: Once | INTRAMUSCULAR | Status: AC
  Administered 2018-09-14: 80 mg via INTRAVENOUS
  Filled 2018-09-14: qty 8

## 2018-09-14 MED ORDER — SODIUM CHLORIDE 0.9% FLUSH
3.0000 mL | INTRAVENOUS | Status: DC | PRN
  Administered 2018-09-15: 3 mL via INTRAVENOUS
  Filled 2018-09-14: qty 3

## 2018-09-14 MED ORDER — METOPROLOL TARTRATE 25 MG PO TABS
25.0000 mg | ORAL_TABLET | Freq: Two times a day (BID) | ORAL | Status: DC
  Administered 2018-09-14 – 2018-09-15 (×2): 25 mg via ORAL
  Filled 2018-09-14 (×2): qty 1

## 2018-09-14 MED ORDER — LEVOFLOXACIN IN D5W 750 MG/150ML IV SOLN
750.0000 mg | Freq: Once | INTRAVENOUS | Status: AC
  Administered 2018-09-14: 750 mg via INTRAVENOUS
  Filled 2018-09-14: qty 150

## 2018-09-14 MED ORDER — LUBIPROSTONE 8 MCG PO CAPS
8.0000 ug | ORAL_CAPSULE | Freq: Every day | ORAL | Status: DC | PRN
  Administered 2018-09-17: 8 ug via ORAL
  Filled 2018-09-14 (×4): qty 1

## 2018-09-14 MED ORDER — ENOXAPARIN SODIUM 30 MG/0.3ML ~~LOC~~ SOLN
30.0000 mg | Freq: Every day | SUBCUTANEOUS | Status: DC
  Administered 2018-09-14: 30 mg via SUBCUTANEOUS
  Filled 2018-09-14: qty 0.3

## 2018-09-14 MED ORDER — ONDANSETRON HCL 4 MG PO TABS
4.0000 mg | ORAL_TABLET | Freq: Four times a day (QID) | ORAL | Status: DC | PRN
  Administered 2018-09-15: 4 mg via ORAL
  Filled 2018-09-14: qty 1

## 2018-09-14 MED ORDER — NITROGLYCERIN 0.4 MG SL SUBL
0.4000 mg | SUBLINGUAL_TABLET | SUBLINGUAL | Status: DC | PRN
  Filled 2018-09-14: qty 1

## 2018-09-14 MED ORDER — ACETAMINOPHEN 325 MG PO TABS
650.0000 mg | ORAL_TABLET | Freq: Four times a day (QID) | ORAL | Status: DC | PRN
  Administered 2018-09-15: 650 mg via ORAL
  Filled 2018-09-14: qty 2

## 2018-09-14 NOTE — ED Triage Notes (Signed)
She arrives oriented and in no distress. She is drowsy in appearance. She c/o being "too weak to get out of my chair today". Her family, with whom she lives told EMS that pt. "hasn't made any urine for nine days" [sic], which the pt. States is not so, that she has been recently making some urine. A mild, congested cough is also noted. She has hx of limited mobility d/t spina bifida. She also c/o chronic paresthesias and some degree of anesthesia of left foot. She has a few small scabbed sores on her toes L > R. Rectal temp: 99.4.

## 2018-09-14 NOTE — ED Provider Notes (Addendum)
[place] DEPT Provider Note   CSN: [id number] Arrival date & time: 08/26/2018  1040     History   Chief Complaint Chief Complaint  Patient presents with  . Weakness    HPI Vanessa Nielsen is a 84 y.o. female.   Weakness  Primary symptoms include no focal weakness. This is a new problem. The problem has been gradually worsening. There was no focality noted. There has been no fever. Associated symptoms include shortness of breath.    Past Medical History:  Diagnosis Date  . Allergic rhinitis   . Coronary artery disease    2v CABG @ [facility]  . Hypercholesteremia   . Hyperlipidemia   . Hypertension   . Iatrogenic Cushing's disease ([place])   . Multiple allergies    induced by pain: swelling in UE/face/torso, nausea, hives followed by hypotension unless tx with high dose steroids  . Osteoarthritis   . Scoliosis   . Spina bifida     Patient Active Problem List   Diagnosis Date Noted  . Respiratory failure with hypoxia ([place]) 09/09/2018  . Heart failure, acute diastolic ([place]) 57/84/6962  . Respiratory failure, acute ([place]) 09/13/2018  . Heart failure ([place]) 08/29/2018  . Hypokalemia 12/21/2013  . Emesis, persistent 12/21/2013  . Cushing syndrome ([place])   . Allergic rhinitis   . Ischemic heart disease 02/25/2011  . Benign hypertensive heart disease without heart failure 02/25/2011  . Hypercholesterolemia 02/25/2011  . Spina bifida of lumbar spine ([place]) 02/25/2011  . Irritable bowel syndrome (IBS) 02/25/2011    Past Surgical History:  Procedure Laterality Date  . ABDOMINAL HYSTERECTOMY  1981 or 84  . ANAL RECTAL MANOMETRY N/A 02/13/2014   Procedure: ANAL RECTAL MANOMETRY;  Surgeon: Arta Silence, MD;  Location: [facility];  Service: Endoscopy;  Laterality: N/A;  . APPENDECTOMY  1960  . CARDIAC CATHETERIZATION  11/2006  . CARDIOVASCULAR STRESS TEST  09/19/2010  . CHOLECYSTECTOMY  2011  . CHOLECYSTECTOMY, LAPAROSCOPIC  05/27/2010  .  CORONARY ANGIOPLASTY  11/2006   left anterior descending vessel  . Hardware removed in spine due to loos screws  1997  . REDUCTION MAMMAPLASTY Bilateral 1998  . Surgery for scoliosis  1995  . TONSILLECTOMY  1943  . TOTAL KNEE ARTHROPLASTY  09/2010   right     OB History   No obstetric history on file.      Home Medications    Prior to Admission medications   Medication Sig Start Date End Date Taking? Authorizing Provider  amLODipine (NORVASC) 5 MG tablet TAKE 1 TABLET BY MOUTH IF SYSTOLIC BLOOD PRESSURE IS ABOVE 150 Patient taking differently: Take 5 mg by mouth daily.  09/03/18  Yes Skeet Latch, MD  aspirin 81 MG EC tablet Take 81 mg by mouth daily.     Yes [provider]  Dextrose-Fructose-Sod Citrate (NAUZENE PO) Take 1 tablet by mouth daily as needed (nausea).   Yes [provider]  ibuprofen (ADVIL,MOTRIN) 200 MG tablet Take 600 mg by mouth 2 (two) times daily as needed for moderate pain.   Yes [provider]  losartan-hydrochlorothiazide (HYZAAR) 100-25 MG tablet TAKE 1 TABLET BY MOUTH EVERY DAY 05/06/18  Yes Skeet Latch, MD  lubiprostone (AMITIZA) 8 MCG capsule Take 8 mcg by mouth daily as needed for constipation.   Yes [provider]  metoprolol tartrate (LOPRESSOR) 50 MG tablet TAKE 1/2 TABLET BY MOUTH 2 TIMES DAILY. Patient taking differently: Take 25 mg by mouth 2 (two) times daily.  08/20/18  Yes Skeet Latch, MD  nitroGLYCERIN (NITROSTAT) 0.4 MG SL tablet PLACE 1 TABLET (0.4 MG TOTAL) UNDER THE TONGUE EVERY 5 (FIVE) MINUTES AS NEEDED FOR CHEST PAIN. 10/05/17  Yes Skeet Latch, MD  ondansetron (ZOFRAN) 8 MG tablet Take 8 mg by mouth every 8 (eight) hours as needed for nausea or vomiting.    Yes [provider]  OVER THE COUNTER MEDICATION Place 1 drop into both nostrils daily as needed (nasal congestion).   Yes [provider]  potassium chloride (MICRO-K) 10 MEQ CR capsule Take 4 capsules by mouth  twice a day Patient taking differently: Take 20 mEq by mouth 2 (two) times daily. Take 4 capsules by mouth twice a day 04/08/18  Yes Skeet Latch, MD  promethazine (PHENERGAN) 25 MG tablet Take 25 mg by mouth every 6 (six) hours as needed for nausea or vomiting.   Yes [provider]  rosuvastatin (CRESTOR) 40 MG tablet Take 1 tablet (40 mg total) by mouth daily. 04/01/18 08/19/2018 Yes Skeet Latch, MD  fluticasone [facility]) 50 MCG/ACT nasal spray USE 2 SPRAYS INTO EACH NOSTRIL EVERY DAY Patient not taking: Reported on 09/01/2018 09/19/15   Darlin Coco, MD    Family History Family History  Problem Relation Age of Onset  . Arthritis Mother   . Cancer Father 1       colon  . Arthritis Father   . Heart disease Other   . Hypertension Other   . Hypertension Maternal Aunt   . Heart attack Neg Hx   . Stroke Neg Hx     Social History Social History   Tobacco Use  . Smoking status: Never Smoker  . Smokeless tobacco: Never Used  . Tobacco comment: married, lives with spouse. 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 Alcohol use: No  . Drug use: Not on file     Allergies   Cardura [doxazosin mesylate]; Clarithromycin; Erythromycin; Guaifenesin; and Ketorolac tromethamine   Review of Systems Review of Systems  Respiratory: Positive for shortness of breath.   Cardiovascular: Positive for leg swelling.  Skin: Positive for color change (pale/cyanotic) and pallor.  Neurological: Positive for weakness. Negative for focal weakness.  All other systems reviewed and are negative.    Physical Exam Updated Vital Signs BP (!) 147/85   Pulse 84   Temp 99.4 F (37.4 C) (Rectal)   Resp (!) 27   SpO2 93%   Physical Exam Vitals signs and nursing note reviewed.  Constitutional:      Appearance: She is well-developed.  HENT:     Head: Normocephalic and atraumatic.  Eyes:     Extraocular Movements: Extraocular  movements intact.     Conjunctiva/sclera: Conjunctivae normal.  Neck:     Musculoskeletal: Normal range of motion.  Cardiovascular:     Rate and Rhythm: Normal rate and regular rhythm.  Pulmonary:     Effort: No respiratory distress.     Breath sounds: No stridor. Wheezing, rhonchi and rales present.  Abdominal:     General: Abdomen is flat. There is no distension.  Musculoskeletal: Normal range of motion.        General: Swelling (BLE) present.  Skin:    Coloration: Skin is pale.     Comments: cyanotic  Neurological:     General: No focal deficit present.     Mental Status: She is alert.      ED Treatments / Results  Labs (all labs ordered are listed, but  only abnormal results are displayed) Labs Reviewed  COMPREHENSIVE METABOLIC PANEL - Abnormal; Notable for the following components:      Result Value   Glucose, Bld 109 (*)    BUN 32 (*)    Creatinine, Ser 1.98 (*)    Calcium 7.9 (*)    Total Protein 5.4 (*)    Albumin 3.1 (*)    AST 47 (*)    GFR calc non Af Amer 23 (*)    GFR calc Af Amer 27 (*)    All other components within normal limits  CBC WITH DIFFERENTIAL/PLATELET - Abnormal; Notable for the following components:   RDW 15.8 (*)    Abs Immature Granulocytes 0.08 (*)    All other components within normal limits  URINALYSIS, ROUTINE W REFLEX MICROSCOPIC - Abnormal; Notable for the following components:   Color, Urine AMBER (*)    APPearance HAZY (*)    Glucose, UA 50 (*)    Ketones, ur 5 (*)    Protein, ur 100 (*)    Bacteria, UA FEW (*)    All other components within normal limits  TROPONIN I - Abnormal; Notable for the following components:   Troponin I 0.17 (*)    All other components within normal limits  BRAIN NATRIURETIC PEPTIDE - Abnormal; Notable for the following components:   B Natriuretic Peptide 1,489.0 (*)    All other components within normal limits  BLOOD GAS, ARTERIAL - Abnormal; Notable for the following components:   pCO2 arterial  48.5 (*)    pO2, Arterial 112 (*)    All other components within normal limits  CULTURE, BLOOD (ROUTINE X 2)  CULTURE, BLOOD (ROUTINE X 2)  TROPONIN I  TROPONIN I  I-STAT CG4 LACTIC ACID, ED  I-STAT CG4 LACTIC ACID, ED    EKG EKG Interpretation  Date/Time:  Tuesday September 14 2018 10:57:46 EST Ventricular Rate:  81 PR Interval:    QRS Duration: 94 QT Interval:  424 QTC Calculation: 493 R Axis:   18 Text Interpretation:  Sinus rhythm Multiform ventricular premature complexes Low voltage, extremity and precordial leads Abnormal T, consider ischemia, anterior leads flattened t waves in I new since  2018 No acute changes Confirmed by Merrily Pew [phone]) on 08/31/2018 12:36:06 PM   Radiology Ct Head Wo Contrast  Result Date: 08/18/2018 CLINICAL DATA:  Generalized muscle weakness EXAM: CT HEAD WITHOUT CONTRAST TECHNIQUE: Contiguous axial images were obtained from the base of the skull through the vertex without intravenous contrast. COMPARISON:  None. FINDINGS: Brain: No evidence of acute infarction, hemorrhage, hydrocephalus, extra-axial collection or mass lesion/mass effect. Extensive low-density in the cerebral white matter consistent with chronic small vessel ischemia. Mild for age cerebral volume loss Vascular: No hyperdense vessel or unexpected calcification. Skull: Normal. Negative for fracture or focal lesion. Sinuses/Orbits: No acute finding. IMPRESSION: 1. No acute finding. 2. Extensive chronic small vessel ischemia. Electronically Signed   By: Monte Fantasia M.D.   On: 08/24/2018 12:53   Dg Chest Port 1 View  Result Date: 09/09/2018 CLINICAL DATA:  Cough. EXAM: PORTABLE CHEST 1 VIEW COMPARISON:  Radiographs of Feb 02, 2017. FINDINGS: Stable cardiomegaly. Atherosclerosis of thoracic aorta is noted. No pneumothorax is noted. Hypoinflation of the lungs is noted with mild bibasilar subsegmental atelectasis or scarring. Bony thorax is unremarkable. IMPRESSION: Hypoinflation of  the lungs with mild bibasilar subsegmental atelectasis or scarring. Aortic Atherosclerosis (ICD10-I70.0). Electronically Signed   By: Marijo Conception, M.D.   On: 08/25/2018 12:07  Procedures Procedures (including critical care time)  CRITICAL CARE Performed by: Merrily Pew Total critical care time: 35 minutes Critical care time was exclusive of separately billable procedures and treating other patients. Critical care was necessary to treat or prevent imminent or life-threatening deterioration. Critical care was time spent personally by me on the following activities: development of treatment plan with patient and/or surrogate as well as nursing, discussions with consultants, evaluation of patient's response to treatment, examination of patient, obtaining history from patient or surrogate, ordering and performing treatments and interventions, ordering and review of laboratory studies, ordering and review of radiographic studies, pulse oximetry and re-evaluation of patient's condition.   Medications Ordered in ED Medications  furosemide (LASIX) injection 80 mg (has no administration in time range)  levofloxacin (LEVAQUIN) IVPB 750 mg (0 mg Intravenous Stopped 09/02/2018 1434)  furosemide (LASIX) injection 80 mg (80 mg Intravenous Given 08/15/2018 1342)  aspirin chewable tablet 324 mg (324 mg Oral Given 09/13/2018 1441)    Initial Impression / Assessment and Plan / ED Course  I have reviewed the triage vital signs and the nursing notes.  Pertinent labs & imaging results that were available during my care of the patient were reviewed by me and considered in my medical decision making (see chart for details).   Patient with worsening difficulty breathing and generalized weakness.  Also with significant difficulty waking her up at night to the point where she almost does not wake up she is very confused for a while after she wakes up.  Abnormal skin color.  Daughter-in-law noticed some JVD as  well. Really significantly hypoxic here and put on nasal cannula however she had mouth breathing so put on a nonrebreather which helped her saturations and she is been pretty consistently 95.  On my review of the x-ray at that showed a left lower lobe pneumonia sized activated code sepsis and gave her Levaquin however radiology read does not agree.  She also did not have a rectal temperature nor did she have white blood cell count so I think is probably less likely at this point.  BMP and troponin are slightly elevated.  Aspirin given and given a dose of Lasix.  Also with new acute kidney injury.  Considered PE but not able to get scan at this time and another reasonable diagnosis as the cause.  Discussed with hospitalist who will admit for further management.  Request that we consult cardiology.  Final Clinical Impressions(s) / ED Diagnoses   Final diagnoses:  Weakness  Hypoxia     Shantika Bermea, Corene Cornea, MD 08/31/2018 [phone]

## 2018-09-14 NOTE — ED Notes (Signed)
Gave report to [facility] for ICU stepdown.

## 2018-09-14 NOTE — ED Notes (Signed)
CRITICAL VALUE ALERT  Critical Value:  Troponin 0.17ng/mL  Date & Time Notied:  08/30/2018 1251  Provider Notified: Mes[place] MD  Orders Received/Actions taken: Provider aware

## 2018-09-14 NOTE — ED Notes (Signed)
Bed: YH88 Expected date:  Expected time:  Means of arrival:  Comments: 84 yo weakness

## 2018-09-14 NOTE — ED Notes (Signed)
ED TO INPATIENT HANDOFF REPORT  Name/Age/Gender Vanessa Nielsen 84 y.o. female  Code Status Code Status History    Date Active Date Inactive Code Status Order ID Comments User Context   12/21/2013 1554 12/22/2013 1733 Full Code [id number]  Velna Hatchet, MD Inpatient      Home/SNF/Other Home  Chief Complaint weakness   Level of Care/Admitting Diagnosis ED Disposition    ED Disposition Condition [place]: [facility] [100102]  Level of Care: Stepdown [14]  Admit to SDU based on following criteria: Respiratory Distress:  Frequent assessment and/or intervention to maintain adequate ventilation/respiration, pulmonary toilet, and respiratory treatment.  Diagnosis: Heart failure [facility]) [[id number]]  Admitting Physician: Elmarie Shiley [phone]  Attending Physician: Niel Hummer A [[id number]]  PT Class (Do Not Modify): Observation [104]  PT Acc Code (Do Not Modify): Observation [10022]       Medical History Past Medical History:  Diagnosis Date  . Allergic rhinitis   . Coronary artery disease    2v CABG @ [facility]  . Hypercholesteremia   . Hyperlipidemia   . Hypertension   . Iatrogenic Cushing's disease ([place])   . Multiple allergies    induced by pain: swelling in UE/face/torso, nausea, hives followed by hypotension unless tx with high dose steroids  . Osteoarthritis   . Scoliosis   . Spina bifida     Allergies Allergies  Allergen Reactions  . Cardura [Doxazosin Mesylate]     Doesn't recall  . Clarithromycin     Doesn't recall  . Erythromycin     Doesn't recall  . Guaifenesin     Doesn't recall  . Ketorolac Tromethamine     Doesn't recall    IV Location/Drains/Wounds Patient Lines/Drains/Airways Status   Active Line/Drains/Airways    Name:   Placement date:   Placement time:   Site:   Days:   Peripheral IV 08/25/2018 Right;Anterior Forearm   09/10/2018    1150    Forearm   less than 1   External Urinary Catheter   09/06/2018     1404    -   less than 1          Labs/Imaging Results for orders placed or performed during the hospital encounter of 08/18/2018 (from the past 48 hour(s))  Urinalysis, Routine w reflex microscopic     Status: Abnormal   Collection Time: 09/09/2018 11:32 AM  Result Value Ref Range   Color, Urine AMBER (A) YELLOW    Comment: BIOCHEMICALS MAY BE AFFECTED BY COLOR   APPearance HAZY (A) CLEAR   Specific Gravity, Urine 1.021 1.005 - 1.030   pH 5.0 5.0 - 8.0   Glucose, UA 50 (A) NEGATIVE mg/dL   Hgb urine dipstick NEGATIVE NEGATIVE   Bilirubin Urine NEGATIVE NEGATIVE   Ketones, ur 5 (A) NEGATIVE mg/dL   Protein, ur 100 (A) NEGATIVE mg/dL   Nitrite NEGATIVE NEGATIVE   Leukocytes, UA NEGATIVE NEGATIVE   RBC / HPF 0-5 0 - 5 RBC/hpf   WBC, UA 6-10 0 - 5 WBC/hpf   Bacteria, UA FEW (A) NONE SEEN   Squamous Epithelial / LPF 0-5 0 - 5   Mucus PRESENT    Hyaline Casts, UA PRESENT     Comment: Performed at [facility], [place] [address] [place], [place] [address]  Comprehensive metabolic panel     Status: Abnormal   Collection Time: 09/04/2018 11:59 AM  Result Value Ref Range   Sodium 141 135 -  145 mmol/L   Potassium 3.7 3.5 - 5.1 mmol/L   Chloride 103 98 - 111 mmol/L   CO2 26 22 - 32 mmol/L   Glucose, Bld 109 (H) 70 - 99 mg/dL   BUN 32 (H) 8 - 23 mg/dL   Creatinine, Ser 1.98 (H) 0.44 - 1.00 mg/dL   Calcium 7.9 (L) 8.9 - 10.3 mg/dL   Total Protein 5.4 (L) 6.5 - 8.1 g/dL   Albumin 3.1 (L) 3.5 - 5.0 g/dL   AST 47 (H) 15 - 41 U/L   ALT 44 0 - 44 U/L   Alkaline Phosphatase 80 38 - 126 U/L   Total Bilirubin 0.8 0.3 - 1.2 mg/dL   GFR calc non Af Amer 23 (L) >60 mL/min   GFR calc Af Amer 27 (L) >60 mL/min   Anion gap 12 5 - 15    Comment: Performed at [facility], [place] [address], [place], [place] [address]  CBC WITH DIFFERENTIAL     Status: Abnormal   Collection Time: 08/17/2018 11:59 AM  Result Value Ref Range   WBC 9.2 4.0 - 10.5 K/uL   RBC 4.70 3.87 -  5.11 MIL/uL   Hemoglobin 13.0 12.0 - 15.0 g/dL   HCT 43.3 36.0 - 46.0 %   MCV 92.1 80.0 - 100.0 fL   MCH 27.7 26.0 - 34.0 pg   MCHC 30.0 30.0 - 36.0 g/dL   RDW 15.8 (H) 11.5 - 15.5 %   Platelets 312 150 - 400 K/uL   nRBC 0.2 0.0 - 0.2 %   Neutrophils Relative % 72 %   Neutro Abs 6.8 1.7 - 7.7 K/uL   Lymphocytes Relative 15 %   Lymphs Abs 1.4 0.7 - 4.0 K/uL   Monocytes Relative 9 %   Monocytes Absolute 0.8 0.1 - 1.0 K/uL   Eosinophils Relative 2 %   Eosinophils Absolute 0.2 0.0 - 0.5 K/uL   Basophils Relative 1 %   Basophils Absolute 0.1 0.0 - 0.1 K/uL   Immature Granulocytes 1 %   Abs Immature Granulocytes 0.08 (H) 0.00 - 0.07 K/uL    Comment: Performed at [facility], [place] [address] [place], [place] [address]  Troponin I - ONCE - STAT     Status: Abnormal   Collection Time: 08/28/2018 11:59 AM  Result Value Ref Range   Troponin I 0.17 (HH) <0.03 ng/mL    Comment: CRITICAL RESULT CALLED TO, READ BACK BY AND VERIFIED WITH: [facility]. RN AT 9628 08/27/2018 MULLINS,T Performed at [facility], [place] [address] [place], [place] [address]   Brain natriuretic peptide     Status: Abnormal   Collection Time: 08/20/2018 11:59 AM  Result Value Ref Range   B Natriuretic Peptide 1,489.0 (H) 0.0 - 100.0 pg/mL    Comment: Performed at [facility], [place] [address] [place], [place] [address]  I-Stat CG4 Lactic Acid, ED     Status: None   Collection Time: 08/30/2018 12:01 PM  Result Value Ref Range   Lactic Acid, Venous 1.03 0.5 - 1.9 mmol/L  Blood gas, arterial     Status: Abnormal   Collection Time: 08/23/2018 12:10 PM  Result Value Ref Range   FIO2 100.00    Delivery systems NRB MASK    pH, Arterial 7.361 7.350 - 7.450   pCO2 arterial 48.5 (H) 32.0 - 48.0 mmHg   pO2, Arterial 112 (H) 83.0 - 108.0 mmHg   Bicarbonate 26.7 20.0 - 28.0 mmol/L  Acid-Base Excess 1.3 0.0 - 2.0 mmol/L   O2 Saturation 97.4 %   Patient temperature 99.4     Collection site RIGHT RADIAL    Drawn by [phone]    Sample type ARTERIAL DRAW    Allens test (pass/fail) PASS PASS    Comment: Performed at [facility], [place] [address], [place], [place] [address]  I-Stat CG4 Lactic Acid, ED     Status: None   Collection Time: 09/13/2018  2:57 PM  Result Value Ref Range   Lactic Acid, Venous 1.11 0.5 - 1.9 mmol/L  Troponin I - Now Then Q6H     Status: Abnormal   Collection Time: 08/19/2018  6:53 PM  Result Value Ref Range   Troponin I 0.15 (HH) <0.03 ng/mL    Comment: CRITICAL RESULT CALLED TO, READ BACK BY AND VERIFIED WITH: Vikki Ports RN 2029 08/22/2018 A NAVARRO Performed at [facility], [place] [address], [place], [place] [address]    Ct Head Wo Contrast  Result Date: 09/11/2018 CLINICAL DATA:  Generalized muscle weakness EXAM: CT HEAD WITHOUT CONTRAST TECHNIQUE: Contiguous axial images were obtained from the base of the skull through the vertex without intravenous contrast. COMPARISON:  None. FINDINGS: Brain: No evidence of acute infarction, hemorrhage, hydrocephalus, extra-axial collection or mass lesion/mass effect. Extensive low-density in the cerebral white matter consistent with chronic small vessel ischemia. Mild for age cerebral volume loss Vascular: No hyperdense vessel or unexpected calcification. Skull: Normal. Negative for fracture or focal lesion. Sinuses/Orbits: No acute finding. IMPRESSION: 1. No acute finding. 2. Extensive chronic small vessel ischemia. Electronically Signed   By: Monte Fantasia M.D.   On: 09/01/2018 12:53   Dg Chest Port 1 View  Result Date: 09/12/2018 CLINICAL DATA:  Cough. EXAM: PORTABLE CHEST 1 VIEW COMPARISON:  Radiographs of Feb 02, 2017. FINDINGS: Stable cardiomegaly. Atherosclerosis of thoracic aorta is noted. No pneumothorax is noted. Hypoinflation of the lungs is noted with mild bibasilar subsegmental atelectasis or scarring. Bony thorax is unremarkable. IMPRESSION: Hypoinflation of  the lungs with mild bibasilar subsegmental atelectasis or scarring. Aortic Atherosclerosis (ICD10-I70.0). Electronically Signed   By: Marijo Conception, M.D.   On: 09/10/2018 12:07   EKG Interpretation  Date/Time:  Tuesday September 14 2018 10:57:46 EST Ventricular Rate:  81 PR Interval:    QRS Duration: 94 QT Interval:  424 QTC Calculation: 493 R Axis:   18 Text Interpretation:  Sinus rhythm Multiform ventricular premature complexes Low voltage, extremity and precordial leads Abnormal T, consider ischemia, anterior leads flattened t waves in I new since  2018 No acute changes Confirmed by Merrily Pew [phone]) on 09/01/2018 12:36:06 PM   Pending Labs Unresulted Labs (From admission, onward)    Start     Ordered   09/06/2018 1858  Troponin I - Now Then Q6H  Now then every 6 hours,   R     08/21/2018 1558   09/08/2018 1132  Blood Culture (routine x 2)  BLOOD CULTURE X 2,   STAT     08/23/2018 1132   Signed and Held  Creatinine, serum  (enoxaparin (LOVENOX)    CrCl < 30 ml/min)  Weekly,   R    Comments:  while on enoxaparin therapy.    Signed and Held   Signed and Held  Basic metabolic panel  Tomorrow morning,   R     Signed and Held   Signed and Held  CBC  Tomorrow morning,   R  Signed and Held          Vitals/Pain Today's Vitals   09/08/2018 1945 08/30/2018 2000 08/21/2018 2015 09/02/2018 2030  BP: (!) 160/88 (!) 163/108 (!) 162/96 (!) 150/100  Pulse: 86 87 85 83  Resp: (!) 28 (!) 26 (!) 27 (!) 24  Temp:      TempSrc:      SpO2: 94% 95% 94% 94%  PainSc:        Isolation Precautions No active isolations  Medications Medications  furosemide (LASIX) injection 80 mg (80 mg Intravenous Given 08/17/2018 1850)  levofloxacin (LEVAQUIN) IVPB 750 mg (0 mg Intravenous Stopped 09/05/2018 1434)  furosemide (LASIX) injection 80 mg (80 mg Intravenous Given 08/20/2018 1342)  aspirin chewable tablet 324 mg (324 mg Oral Given 08/29/2018 1441)    Mobility walks with device

## 2018-09-14 NOTE — ED Notes (Signed)
Attempted to call report to Merry Proud, RN but he will call back shortly after taking care of a patient.

## 2018-09-14 NOTE — ED Notes (Signed)
Pt placed on cardiac monitor,

## 2018-09-14 NOTE — ED Notes (Addendum)
Date and time results received: 08/28/2018 8:30 PM  (use smartphrase ".now" to insert current time)  Test: Troponin Critical Value: 0.15  Name of Provider Notified: Mid Level   Orders Received? Or Actions Taken?: Orders Received - See Orders for details

## 2018-09-14 NOTE — H&P (Signed)
History and Physical  Vanessa Nielsen GYI:[id number] DOB: Jan 28, 1934 DOA: 09/13/2018  PCP: Velna Hatchet, MD Patient coming from: Home   I have personally briefly reviewed patient's old medical records in [place]   Chief Complaint: weakness, dyspnea.   HPI: Vanessa Nielsen is a 84 y.o. female with past medical history significant for spina bifida,, hypertension, coronary artery disease status post CABG 2008, last stress test was 2016 and was negative, patient at baseline is able to take a few steps and transition from bed to wheelchair from wheelchair to her car.  Over the last couple of days she has been very weak and she has not been able to get out of the bed.  Every time she tries to get out of the bed she ended sliding down on the floor.  Last night her husband had to call EMS to assist with getting her from the floor to the bed after a fall.  Also husband reports that over the last for 5 months patient has been at times hard to wake up, more over the last 2 weeks.  Patient has been more confused, she does not have any recollection of recent events.  Currently denies chest pain.  Per husband she had an episode of chest pain the night prior to admission.  Received a dose 2 doses of nitroglycerin. Evaluation in the ED; reporting at 0.17, BNP 1489, BUN 32, creatinine 1.9, he was found to be very hypoxic she was placed on nonrebreather mask because she is a mouth breather.  She was also noted to be cyanotic on arrival to the ED. her respiratory status has improved.  600 cc in the urinal.   Review of Systems: All systems reviewed and apart from history of presenting illness, are negative.  Past Medical History:  Diagnosis Date  . Allergic rhinitis   . Coronary artery disease    2v CABG @ [facility]  . Hypercholesteremia   . Hyperlipidemia   . Hypertension   . Iatrogenic Cushing's disease ([place])   . Multiple allergies    induced by pain: swelling in UE/face/torso, nausea,  hives followed by hypotension unless tx with high dose steroids  . Osteoarthritis   . Scoliosis   . Spina bifida    Past Surgical History:  Procedure Laterality Date  . ABDOMINAL HYSTERECTOMY  1981 or 84  . ANAL RECTAL MANOMETRY N/A 02/13/2014   Procedure: ANAL RECTAL MANOMETRY;  Surgeon: Arta Silence, MD;  Location: [facility];  Service: Endoscopy;  Laterality: N/A;  . APPENDECTOMY  1960  . CARDIAC CATHETERIZATION  11/2006  . CARDIOVASCULAR STRESS TEST  09/19/2010  . CHOLECYSTECTOMY  2011  . CHOLECYSTECTOMY, LAPAROSCOPIC  05/27/2010  . CORONARY ANGIOPLASTY  11/2006   left anterior descending vessel  . Hardware removed in spine due to loos screws  1997  . REDUCTION MAMMAPLASTY Bilateral 1998  . Surgery for scoliosis  1995  . TONSILLECTOMY  1943  . TOTAL KNEE ARTHROPLASTY  09/2010   right   Social History:  reports that she has never smoked. She has never used smokeless tobacco. She reports that she does not drink alcohol. No history on file for drug.   Allergies  Allergen Reactions  . Cardura [Doxazosin Mesylate]     Doesn't recall  . Clarithromycin     Doesn't recall  . Erythromycin     Doesn't recall  . Guaifenesin     Doesn't recall  . Ketorolac Tromethamine     Doesn't recall  Family History  Problem Relation Age of Onset  . Arthritis Mother   . Cancer Father 18       colon  . Arthritis Father   . Heart disease Other   . Hypertension Other   . Hypertension Maternal Aunt   . Heart attack Neg Hx   . Stroke Neg Hx      Prior to Admission medications   Medication Sig Start Date End Date Taking? Authorizing Provider  amLODipine (NORVASC) 5 MG tablet TAKE 1 TABLET BY MOUTH IF SYSTOLIC BLOOD PRESSURE IS ABOVE 150 Patient taking differently: Take 5 mg by mouth daily.  09/03/18  Yes Skeet Latch, MD  aspirin 81 MG EC tablet Take 81 mg by mouth daily.     Yes [provider]  Dextrose-Fructose-Sod Citrate (NAUZENE PO) Take 1 tablet by mouth daily as  needed (nausea).   Yes [provider]  ibuprofen (ADVIL,MOTRIN) 200 MG tablet Take 600 mg by mouth 2 (two) times daily as needed for moderate pain.   Yes [provider]  losartan-hydrochlorothiazide (HYZAAR) 100-25 MG tablet TAKE 1 TABLET BY MOUTH EVERY DAY 05/06/18  Yes Skeet Latch, MD  lubiprostone (AMITIZA) 8 MCG capsule Take 8 mcg by mouth daily as needed for constipation.   Yes [provider]  metoprolol tartrate (LOPRESSOR) 50 MG tablet TAKE 1/2 TABLET BY MOUTH 2 TIMES DAILY. Patient taking differently: Take 25 mg by mouth 2 (two) times daily.  08/20/18  Yes Skeet Latch, MD  nitroGLYCERIN (NITROSTAT) 0.4 MG SL tablet PLACE 1 TABLET (0.4 MG TOTAL) UNDER THE TONGUE EVERY 5 (FIVE) MINUTES AS NEEDED FOR CHEST PAIN. 10/05/17  Yes Skeet Latch, MD  ondansetron (ZOFRAN) 8 MG tablet Take 8 mg by mouth every 8 (eight) hours as needed for nausea or vomiting.    Yes [provider]  OVER THE COUNTER MEDICATION Place 1 drop into both nostrils daily as needed (nasal congestion).   Yes [provider]  potassium chloride (MICRO-K) 10 MEQ CR capsule Take 4 capsules by mouth twice a day Patient taking differently: Take 20 mEq by mouth 2 (two) times daily. Take 4 capsules by mouth twice a day 04/08/18  Yes Skeet Latch, MD  promethazine (PHENERGAN) 25 MG tablet Take 25 mg by mouth every 6 (six) hours as needed for nausea or vomiting.   Yes [provider]  rosuvastatin (CRESTOR) 40 MG tablet Take 1 tablet (40 mg total) by mouth daily. 04/01/18 08/21/2018 Yes Skeet Latch, MD  fluticasone [facility]) 50 MCG/ACT nasal spray USE 2 SPRAYS INTO EACH NOSTRIL EVERY DAY Patient not taking: Reported on 08/19/2018 09/19/15   Darlin Coco, MD   Physical Exam: Vitals:   09/12/2018 1445 09/11/2018 1500 08/20/2018 1515 09/07/2018 1530  BP: (!) 165/110 (!) 162/123 (!) 146/102 (!) 147/85  Pulse: 89 83 84 84  Resp: (!) 27 (!) 36 (!) 26 (!) 27  Temp:       TempSrc:      SpO2: 90% 96% 92% 93%     General exam: Moderately built and nourished patient, lying comfortably supine on the gurney in no obvious distress.  Nonrebreather mask no cyanosis  Head, eyes and ENT: Nontraumatic and normocephalic. Pupils equally reacting to light and accommodation. Oral mucosa moist.  Neck: Supple.  Positive JVD, carotid bruit or thyromegaly.  Lymphatics: No lymphadenopathy.  Respiratory system: No tachypnea bilateral diffuse crackles  Cardiovascular system: S1 and S2 heard, RRR. No JVD, murmurs, gallops, +2 lower extremity edema  Gastrointestinal system: Abdomen is  nondistended, soft and nontender. Normal bowel sounds heard. No organomegaly or masses appreciated.  Central nervous system: Alert and oriented.  Confused, asking over and over the same question  Extremities: Symmetric 5 x 5 power. Peripheral pulses symmetrically felt.   Skin: Multiple ecchymosis in the left lower extremity  Musculoskeletal system: Negative exam.  Psychiatry: Pleasant and cooperative.   Labs on Admission:  Basic Metabolic Panel: Recent Labs  Lab 09/03/2018 1159  NA 141  K 3.7  CL 103  CO2 26  GLUCOSE 109*  BUN 32*  CREATININE 1.98*  CALCIUM 7.9*   Liver Function Tests: Recent Labs  Lab 09/05/2018 1159  AST 47*  ALT 44  ALKPHOS 80  BILITOT 0.8  PROT 5.4*  ALBUMIN 3.1*   No results for input(s): LIPASE, AMYLASE in the last 168 hours. No results for input(s): AMMONIA in the last 168 hours. CBC: Recent Labs  Lab 09/02/2018 1159  WBC 9.2  NEUTROABS 6.8  HGB 13.0  HCT 43.3  MCV 92.1  PLT 312   Cardiac Enzymes: Recent Labs  Lab 09/06/2018 1159  TROPONINI 0.17*    BNP (last 3 results) No results for input(s): PROBNP in the last 8760 hours. CBG: No results for input(s): GLUCAP in the last 168 hours.  Radiological Exams on Admission: Ct Head Wo Contrast  Result Date: 08/24/2018 CLINICAL DATA:  Generalized muscle weakness EXAM: CT HEAD WITHOUT  CONTRAST TECHNIQUE: Contiguous axial images were obtained from the base of the skull through the vertex without intravenous contrast. COMPARISON:  None. FINDINGS: Brain: No evidence of acute infarction, hemorrhage, hydrocephalus, extra-axial collection or mass lesion/mass effect. Extensive low-density in the cerebral white matter consistent with chronic small vessel ischemia. Mild for age cerebral volume loss Vascular: No hyperdense vessel or unexpected calcification. Skull: Normal. Negative for fracture or focal lesion. Sinuses/Orbits: No acute finding. IMPRESSION: 1. No acute finding. 2. Extensive chronic small vessel ischemia. Electronically Signed   By: Monte Fantasia M.D.   On: 09/12/2018 12:53   Dg Chest Port 1 View  Result Date: 09/03/2018 CLINICAL DATA:  Cough. EXAM: PORTABLE CHEST 1 VIEW COMPARISON:  Radiographs of Feb 02, 2017. FINDINGS: Stable cardiomegaly. Atherosclerosis of thoracic aorta is noted. No pneumothorax is noted. Hypoinflation of the lungs is noted with mild bibasilar subsegmental atelectasis or scarring. Bony thorax is unremarkable. IMPRESSION: Hypoinflation of the lungs with mild bibasilar subsegmental atelectasis or scarring. Aortic Atherosclerosis (ICD10-I70.0). Electronically Signed   By: Marijo Conception, M.D.   On: 08/16/2018 12:07    EKG: Independently reviewed. V 1- V 3 T wave inversion.   Assessment/Plan Active Problems:   Spina bifida of lumbar spine ([place])   Respiratory failure with hypoxia (HCC)   Heart failure, acute diastolic (HCC)   Respiratory failure, acute (HCC)   Heart failure ([place])  1-Acute hypoxic respiratory failure: In the setting of acute diastolic heart failure exacerbation. Patient presented with hypoxemia, volume overload, elevated BNP. She received 80 mg of IV Lasix, with 600 cc urine output already. We will continue with 80 mg IV twice daily. Admitted to the stepdown unit. Continue with oxygen supplementation. Daily weight, strict I's and  O's.Marland Kitchen   2-Acute on chronic renal failure stage II; creatinine per records 0.95. Patient presented with a creatinine level 1.9. Suspect acute renal failure related to cardiorenal syndrome. Monitor on IV Lasix. Good urine output.  3-Generalized weakness; likely related to hypoxemia and volume overload. She will need PT eval.  4-concern for sepsis; ED physician was concerned with sepsis initially.  Patient is afebrile, no leukocytosis. She received a dose of IV Levaquin.  I will hold any further antibiotics at this point. Follow blood cultures.  5-Hypertension; hold Norvasc, Hyzaar for now. Continue with metoprolol.   6-elevation troponin;  EKG changes. Patient has history of chest pain on and off.  Per last cardiology note she experience for episode of chest pain during the year. Patient is currently chest pain-free. Continue with metoprolol, aspirin, statins. Continue to cycle cardiac enzymes. Cardiology will be consulted by ED physician.   DVT Prophylaxis: Lovenox Code Status: DNR, discussed with husband Family Communication: Husband at bedside.  Disposition Plan: admit to step down unit for management of respiratory failure, heart failure.   Time spent: 75 minutes.   Elmarie Shiley MD [facility] Pager [phone]  If 7PM-7AM, please contact night-coverage [email or web address] Password [facility]  09/05/2018, 3:58 PM

## 2018-09-15 ENCOUNTER — Other Ambulatory Visit: Payer: Self-pay

## 2018-09-15 ENCOUNTER — Observation Stay (HOSPITAL_COMMUNITY): Payer: Medicare Other

## 2018-09-15 DIAGNOSIS — I361 Nonrheumatic tricuspid (valve) insufficiency: Secondary | ICD-10-CM | POA: Diagnosis not present

## 2018-09-15 DIAGNOSIS — N179 Acute kidney failure, unspecified: Secondary | ICD-10-CM

## 2018-09-15 DIAGNOSIS — G4730 Sleep apnea, unspecified: Secondary | ICD-10-CM

## 2018-09-15 DIAGNOSIS — I480 Paroxysmal atrial fibrillation: Secondary | ICD-10-CM | POA: Diagnosis present

## 2018-09-15 DIAGNOSIS — I248 Other forms of acute ischemic heart disease: Secondary | ICD-10-CM

## 2018-09-15 DIAGNOSIS — I5031 Acute diastolic (congestive) heart failure: Secondary | ICD-10-CM | POA: Diagnosis present

## 2018-09-15 DIAGNOSIS — I25708 Atherosclerosis of coronary artery bypass graft(s), unspecified, with other forms of angina pectoris: Secondary | ICD-10-CM

## 2018-09-15 DIAGNOSIS — E876 Hypokalemia: Secondary | ICD-10-CM

## 2018-09-15 DIAGNOSIS — Z7189 Other specified counseling: Secondary | ICD-10-CM | POA: Diagnosis not present

## 2018-09-15 DIAGNOSIS — R296 Repeated falls: Secondary | ICD-10-CM | POA: Diagnosis present

## 2018-09-15 DIAGNOSIS — E242 Drug-induced Cushing's syndrome: Secondary | ICD-10-CM | POA: Diagnosis present

## 2018-09-15 DIAGNOSIS — I2729 Other secondary pulmonary hypertension: Secondary | ICD-10-CM | POA: Diagnosis not present

## 2018-09-15 DIAGNOSIS — R531 Weakness: Secondary | ICD-10-CM | POA: Diagnosis not present

## 2018-09-15 DIAGNOSIS — F0390 Unspecified dementia without behavioral disturbance: Secondary | ICD-10-CM | POA: Diagnosis present

## 2018-09-15 DIAGNOSIS — R0902 Hypoxemia: Secondary | ICD-10-CM | POA: Diagnosis not present

## 2018-09-15 DIAGNOSIS — I4892 Unspecified atrial flutter: Secondary | ICD-10-CM | POA: Diagnosis present

## 2018-09-15 DIAGNOSIS — I10 Essential (primary) hypertension: Secondary | ICD-10-CM | POA: Diagnosis not present

## 2018-09-15 DIAGNOSIS — Z9989 Dependence on other enabling machines and devices: Secondary | ICD-10-CM | POA: Diagnosis not present

## 2018-09-15 DIAGNOSIS — W19XXXA Unspecified fall, initial encounter: Secondary | ICD-10-CM | POA: Diagnosis present

## 2018-09-15 DIAGNOSIS — I5033 Acute on chronic diastolic (congestive) heart failure: Secondary | ICD-10-CM | POA: Diagnosis not present

## 2018-09-15 DIAGNOSIS — E785 Hyperlipidemia, unspecified: Secondary | ICD-10-CM | POA: Diagnosis present

## 2018-09-15 DIAGNOSIS — Q057 Lumbar spina bifida without hydrocephalus: Secondary | ICD-10-CM | POA: Diagnosis not present

## 2018-09-15 DIAGNOSIS — Z515 Encounter for palliative care: Secondary | ICD-10-CM | POA: Diagnosis not present

## 2018-09-15 DIAGNOSIS — I2510 Atherosclerotic heart disease of native coronary artery without angina pectoris: Secondary | ICD-10-CM | POA: Diagnosis present

## 2018-09-15 DIAGNOSIS — N182 Chronic kidney disease, stage 2 (mild): Secondary | ICD-10-CM | POA: Diagnosis not present

## 2018-09-15 DIAGNOSIS — I130 Hypertensive heart and chronic kidney disease with heart failure and stage 1 through stage 4 chronic kidney disease, or unspecified chronic kidney disease: Secondary | ICD-10-CM | POA: Diagnosis present

## 2018-09-15 DIAGNOSIS — Y848 Other medical procedures as the cause of abnormal reaction of the patient, or of later complication, without mention of misadventure at the time of the procedure: Secondary | ICD-10-CM | POA: Diagnosis present

## 2018-09-15 DIAGNOSIS — M419 Scoliosis, unspecified: Secondary | ICD-10-CM | POA: Diagnosis present

## 2018-09-15 DIAGNOSIS — R7989 Other specified abnormal findings of blood chemistry: Secondary | ICD-10-CM | POA: Diagnosis not present

## 2018-09-15 DIAGNOSIS — Z66 Do not resuscitate: Secondary | ICD-10-CM | POA: Diagnosis not present

## 2018-09-15 DIAGNOSIS — G9340 Encephalopathy, unspecified: Secondary | ICD-10-CM | POA: Diagnosis present

## 2018-09-15 DIAGNOSIS — J9811 Atelectasis: Secondary | ICD-10-CM | POA: Diagnosis present

## 2018-09-15 DIAGNOSIS — E7800 Pure hypercholesterolemia, unspecified: Secondary | ICD-10-CM | POA: Diagnosis present

## 2018-09-15 DIAGNOSIS — J9601 Acute respiratory failure with hypoxia: Secondary | ICD-10-CM | POA: Diagnosis present

## 2018-09-15 DIAGNOSIS — I444 Left anterior fascicular block: Secondary | ICD-10-CM | POA: Diagnosis present

## 2018-09-15 DIAGNOSIS — R918 Other nonspecific abnormal finding of lung field: Secondary | ICD-10-CM | POA: Diagnosis not present

## 2018-09-15 DIAGNOSIS — T82848A Pain from vascular prosthetic devices, implants and grafts, initial encounter: Secondary | ICD-10-CM | POA: Diagnosis not present

## 2018-09-15 LAB — CBC
HCT: 41.4 % (ref 36.0–46.0)
Hemoglobin: 12.6 g/dL (ref 12.0–15.0)
MCH: 27.9 pg (ref 26.0–34.0)
MCHC: 30.4 g/dL (ref 30.0–36.0)
MCV: 91.8 fL (ref 80.0–100.0)
Platelets: 283 10*3/uL (ref 150–400)
RBC: 4.51 MIL/uL (ref 3.87–5.11)
RDW: 15.8 % — ABNORMAL HIGH (ref 11.5–15.5)
WBC: 11.3 10*3/uL — ABNORMAL HIGH (ref 4.0–10.5)
nRBC: 0 % (ref 0.0–0.2)

## 2018-09-15 LAB — BASIC METABOLIC PANEL
ANION GAP: 13 (ref 5–15)
BUN: 28 mg/dL — ABNORMAL HIGH (ref 8–23)
CO2: 31 mmol/L (ref 22–32)
Calcium: 8 mg/dL — ABNORMAL LOW (ref 8.9–10.3)
Chloride: 98 mmol/L (ref 98–111)
Creatinine, Ser: 1.82 mg/dL — ABNORMAL HIGH (ref 0.44–1.00)
GFR calc Af Amer: 29 mL/min — ABNORMAL LOW (ref >60)
GFR calc non Af Amer: 25 mL/min — ABNORMAL LOW (ref >60)
Glucose, Bld: 81 mg/dL (ref 70–99)
Potassium: 3.1 mmol/L — ABNORMAL LOW (ref 3.5–5.1)
Sodium: 142 mmol/L (ref 135–145)

## 2018-09-15 LAB — MAGNESIUM: Magnesium: 1.3 mg/dL — ABNORMAL LOW (ref 1.7–2.4)

## 2018-09-15 LAB — ECHOCARDIOGRAM COMPLETE
Height: 61 in
Weight: 3178.15 oz

## 2018-09-15 LAB — TROPONIN I
Troponin I: 0.14 ng/mL (ref <0.03)
Troponin I: 0.11 ng/mL (ref <0.03)

## 2018-09-15 LAB — VITAMIN B12: VITAMIN B 12: 229 pg/mL (ref 180–914)

## 2018-09-15 MED ORDER — VITAMIN B-12 100 MCG PO TABS
100.0000 ug | ORAL_TABLET | Freq: Every day | ORAL | Status: DC
  Administered 2018-09-15 – 2018-09-16 (×2): 100 ug via ORAL
  Filled 2018-09-15 (×4): qty 1

## 2018-09-15 MED ORDER — POTASSIUM CHLORIDE CRYS ER 20 MEQ PO TBCR
40.0000 meq | EXTENDED_RELEASE_TABLET | Freq: Once | ORAL | Status: AC
  Administered 2018-09-15: 40 meq via ORAL
  Filled 2018-09-15: qty 2

## 2018-09-15 MED ORDER — ORAL CARE MOUTH RINSE
15.0000 mL | Freq: Two times a day (BID) | OROMUCOSAL | Status: DC
  Administered 2018-09-15 – 2018-09-18 (×6): 15 mL via OROMUCOSAL

## 2018-09-15 MED ORDER — POLYETHYLENE GLYCOL 3350 17 G PO PACK
17.0000 g | PACK | Freq: Two times a day (BID) | ORAL | Status: DC
  Administered 2018-09-15 – 2018-09-17 (×5): 17 g via ORAL
  Filled 2018-09-15 (×5): qty 1

## 2018-09-15 MED ORDER — MAGNESIUM SULFATE 2 GM/50ML IV SOLN
2.0000 g | Freq: Once | INTRAVENOUS | Status: AC
  Administered 2018-09-15: 2 g via INTRAVENOUS
  Filled 2018-09-15: qty 50

## 2018-09-15 MED ORDER — POTASSIUM CHLORIDE CRYS ER 20 MEQ PO TBCR
20.0000 meq | EXTENDED_RELEASE_TABLET | Freq: Once | ORAL | Status: AC
  Administered 2018-09-15: 20 meq via ORAL
  Filled 2018-09-15: qty 1

## 2018-09-15 MED ORDER — MAGNESIUM OXIDE 400 (241.3 MG) MG PO TABS
400.0000 mg | ORAL_TABLET | Freq: Every day | ORAL | Status: DC
  Administered 2018-09-15 – 2018-09-16 (×2): 400 mg via ORAL
  Filled 2018-09-15 (×2): qty 1

## 2018-09-15 MED ORDER — DIAZEPAM 5 MG PO TABS
5.0000 mg | ORAL_TABLET | Freq: Every evening | ORAL | Status: DC | PRN
  Administered 2018-09-15 (×2): 5 mg via ORAL
  Filled 2018-09-15 (×2): qty 1

## 2018-09-15 MED ORDER — METOPROLOL TARTRATE 25 MG PO TABS
50.0000 mg | ORAL_TABLET | Freq: Two times a day (BID) | ORAL | Status: DC
  Administered 2018-09-15 – 2018-09-17 (×5): 50 mg via ORAL
  Filled 2018-09-15 (×5): qty 2

## 2018-09-15 MED ORDER — APIXABAN 2.5 MG PO TABS
2.5000 mg | ORAL_TABLET | Freq: Two times a day (BID) | ORAL | Status: DC
  Administered 2018-09-15 – 2018-09-17 (×5): 2.5 mg via ORAL
  Filled 2018-09-15 (×5): qty 1

## 2018-09-15 MED ORDER — METOPROLOL TARTRATE 25 MG PO TABS
25.0000 mg | ORAL_TABLET | Freq: Once | ORAL | Status: AC
  Administered 2018-09-15: 25 mg via ORAL
  Filled 2018-09-15: qty 1

## 2018-09-15 NOTE — Progress Notes (Addendum)
Called to Pt room for desaturations.  Pt recently weaned from nonrebreather to 6Lpm nasal cannula.  Tried Pt on green salter HFNC at 15Lpm but SpO2 only increased to 89%.  I examined Pt further and confirmed she was mouth-breathing.  Pt placed on 55% venturi mask with resolution of desaturations.  Pt sleeping comfortably with SpO2 range of 92-95%.

## 2018-09-15 NOTE — Progress Notes (Signed)
Received call from Central telemetry that patient converted  From sinus rhythm to A fib @0532 . Dr. Tyrell Antonio on unit and was notified new orders received . EKG done and placed on chart

## 2018-09-15 NOTE — Progress Notes (Signed)
   With AFLUTTER, will give additional metoprolol 25mg  PO now and then change to metoprolol 50mg  PO BID to be given tonight.   Will start Eliquis 2.5mg  PO BID and stop ASA 81mg .   Candee Furbish, MD

## 2018-09-15 NOTE — Progress Notes (Signed)
Notified by tele monitoring of patient desat to low 80's. Went to bedside to assess, patient asleep, aroused easily. O2 at 6L [place]. Spouse at bedside and voices that she does not use CPAP at night. RT notified.

## 2018-09-15 NOTE — Progress Notes (Signed)
PROGRESS NOTE    Vanessa HUYSER  JSH:[id number] DOB: 1934/05/24 DOA: 08/28/2018 PCP: Velna Hatchet, MD    Brief Narrative: STEPAHNIE Nielsen is a 85 y.o. female with past medical history significant for spina bifida,, hypertension, coronary artery disease status post CABG 2008, last stress test was 2016 and was negative, patient at baseline is able to take a few steps and transition from bed to wheelchair from wheelchair to her car.  Patient presented with progressive weakness, recurrent fall, worsening shortness of breath and hypoxemia.  Assessment & Plan:   Principal Problem:   Respiratory failure with hypoxia ([place]) Active Problems:   Spina bifida of lumbar spine (HCC)   Heart failure, acute diastolic (HCC)   Respiratory failure, acute ([place])   Heart failure ([place])  1-Acute hypoxic respiratory failure: In the setting of acute diastolic heart failure exacerbation. Patient presented with hypoxemia, volume overload, elevated BNP. Continue with 80 mg IV twice daily. Remain in the stepdown unit today patient currently on 10 L of high flow oxygen.. Weight down to 198.  Urine output 1.2 L.  2-Acute on chronic renal failure stage II; creatinine per records 0.95. Patient presented with a creatinine level 1.9. Suspect acute renal failure related to cardiorenal syndrome. Monitor on IV Lasix. Creatinine slightly decreased to 1.8  3-Generalized weakness; likely related to hypoxemia and volume overload. She will need PT eval.  4-concern for sepsis; was rule out ED physician was concerned with sepsis initially.  Patient is afebrile, no leukocytosis. She received a dose of IV Levaquin.  I will hold any further antibiotics at this point. Follow blood cultures.  No growth to date  5-Hypertension; hold Norvasc, Hyzaar for now. Continue with metoprolol.   6-elevation troponin;  EKG changes. Patient has history of chest pain on and off.  Per last cardiology note she experience for  episode of chest pain during the year. Patient is currently chest pain-free. Continue with metoprolol, aspirin, statins. Cardiology consulted  7-A. Fib; patient converted to A. fib rate control.  Cardiology will also see in consultation. 8-acute encephalopathy; likely related to acute illness, hypoxemia, heart failure exacerbation.  Check B12 level Hypomagnesemia; Order IV magnesium Estimated body mass index is 37.53 kg/m as calculated from the following:   Height as of this encounter: 5\' 1"  (1.549 m).   Weight as of this encounter: 90.1 kg.   DVT prophylaxis: Lovenox Code Status: DNR Family Communication: Daughter in law at bedside Disposition Plan: Admit in the stepdown unit, patient required high flow oxygen   Consultants:   Cardiology   Procedures:  Echo   Antimicrobials:   None   Subjective: This is still confused, she is breathing better.  She is a still requiring high flow oxygen at 10 L.  She does not use oxygen at home Objective: Vitals:   09/15/18 0316 09/15/18 0500 09/15/18 0600 09/15/18 0800  BP:    (!) 165/83  Pulse:    91  Resp:    20  Temp: 98.4 F (36.9 C)   98.6 F (37 C)  TempSrc: Oral   Oral  SpO2:   90% 94%  Weight:  90.1 kg    Height:        Intake/Output Summary (Last 24 hours) at 09/15/2018 0846 Last data filed at 08/16/2018 2200 Gross per 24 hour  Intake -  Output 1200 ml  Net -1200 ml   Filed Weights   09/08/2018 2103 08/23/2018 2114 09/15/18 0500  Weight: 91 kg 91 kg 90.1 kg  Examination:  General exam: Appears calm and comfortable  Respiratory system: Bilateral crackles Cardiovascular system: S1 & S2 heard, RRR. No JVD, murmurs, rubs, gallops or clicks. No pedal edema. Gastrointestinal system: Abdomen is nondistended, soft and nontender. No organomegaly or masses felt. Normal bowel sounds heard. Central nervous system: Confused Extremities: Symmetric 5 x 5 power.  Less lower extremity edema Skin: No rashes, lesions or  ulcers   Data Reviewed: I have personally reviewed following labs and imaging studies  CBC: Recent Labs  Lab 08/17/2018 1159 09/15/18 0053  WBC 9.2 11.3*  NEUTROABS 6.8  --   HGB 13.0 12.6  HCT 43.3 41.4  MCV 92.1 91.8  PLT 312 462   Basic Metabolic Panel: Recent Labs  Lab 08/29/2018 1159 09/15/18 0053  NA 141 142  K 3.7 3.1*  CL 103 98  CO2 26 31  GLUCOSE 109* 81  BUN 32* 28*  CREATININE 1.98* 1.82*  CALCIUM 7.9* 8.0*   GFR: Estimated Creatinine Clearance: 24.3 mL/min (A) (by C-G formula based on SCr of 1.82 mg/dL (H)). Liver Function Tests: Recent Labs  Lab 09/10/2018 1159  AST 47*  ALT 44  ALKPHOS 80  BILITOT 0.8  PROT 5.4*  ALBUMIN 3.1*   No results for input(s): LIPASE, AMYLASE in the last 168 hours. No results for input(s): AMMONIA in the last 168 hours. Coagulation Profile: No results for input(s): INR, PROTIME in the last 168 hours. Cardiac Enzymes: Recent Labs  Lab 08/18/2018 1159 08/24/2018 1853 09/15/18 0053 09/15/18 0654  TROPONINI 0.17* 0.15* 0.14* 0.11*   BNP (last 3 results) No results for input(s): PROBNP in the last 8760 hours. HbA1C: No results for input(s): HGBA1C in the last 72 hours. CBG: No results for input(s): GLUCAP in the last 168 hours. Lipid Profile: No results for input(s): CHOL, HDL, LDLCALC, TRIG, CHOLHDL, LDLDIRECT in the last 72 hours. Thyroid Function Tests: No results for input(s): TSH, T4TOTAL, FREET4, T3FREE, THYROIDAB in the last 72 hours. Anemia Panel: No results for input(s): VITAMINB12, FOLATE, FERRITIN, TIBC, IRON, RETICCTPCT in the last 72 hours. Sepsis Labs: Recent Labs  Lab 08/25/2018 1201 09/11/2018 1457  LATICACIDVEN 1.03 1.11    Recent Results (from the past 240 hour(s))  MRSA PCR Screening     Status: None   Collection Time: 08/19/2018  9:34 PM  Result Value Ref Range Status   MRSA by PCR NEGATIVE NEGATIVE Final    Comment:        The GeneXpert MRSA Assay (FDA approved for NASAL specimens only), is  one component of a comprehensive MRSA colonization surveillance program. It is not intended to diagnose MRSA infection nor to guide or monitor treatment for MRSA infections. Performed at [facility], [place] [address], [place], [place] [address]          Radiology Studies: Ct Head Wo Contrast  Result Date: 08/18/2018 CLINICAL DATA:  Generalized muscle weakness EXAM: CT HEAD WITHOUT CONTRAST TECHNIQUE: Contiguous axial images were obtained from the base of the skull through the vertex without intravenous contrast. COMPARISON:  None. FINDINGS: Brain: No evidence of acute infarction, hemorrhage, hydrocephalus, extra-axial collection or mass lesion/mass effect. Extensive low-density in the cerebral white matter consistent with chronic small vessel ischemia. Mild for age cerebral volume loss Vascular: No hyperdense vessel or unexpected calcification. Skull: Normal. Negative for fracture or focal lesion. Sinuses/Orbits: No acute finding. IMPRESSION: 1. No acute finding. 2. Extensive chronic small vessel ischemia. Electronically Signed   By: Monte Fantasia M.D.   On: 09/12/2018  12:53   Dg Chest Port 1 View  Result Date: 08/26/2018 CLINICAL DATA:  Cough. EXAM: PORTABLE CHEST 1 VIEW COMPARISON:  Radiographs of Feb 02, 2017. FINDINGS: Stable cardiomegaly. Atherosclerosis of thoracic aorta is noted. No pneumothorax is noted. Hypoinflation of the lungs is noted with mild bibasilar subsegmental atelectasis or scarring. Bony thorax is unremarkable. IMPRESSION: Hypoinflation of the lungs with mild bibasilar subsegmental atelectasis or scarring. Aortic Atherosclerosis (ICD10-I70.0). Electronically Signed   By: Marijo Conception, M.D.   On: 09/02/2018 12:07        Scheduled Meds: . aspirin EC  81 mg Oral Daily  . enoxaparin (LOVENOX) injection  30 mg Subcutaneous QHS  . furosemide  80 mg Intravenous Q12H  . mouth rinse  15 mL Mouth Rinse BID  . metoprolol tartrate  25 mg Oral BID    . polyethylene glycol  17 g Oral BID  . potassium chloride  40 mEq Oral Once  . rosuvastatin  40 mg Oral Daily  . sodium chloride flush  3 mL Intravenous Q12H   Continuous Infusions: . sodium chloride       LOS: 0 days    Time spent: 35 minutes.     Elmarie Shiley, MD [facility] Pager [phone]  If 7PM-7AM, please contact night-coverage [email or web address] Password [facility] 09/15/2018, 8:46 AM

## 2018-09-15 NOTE — Consult Note (Addendum)
Cardiology Consultation:   Patient ID: Vanessa Nielsen MRN: [id number]; DOB: February 24, 1934  Admit date: 08/26/2018 Date of Consult: 09/15/2018  Primary Care Provider: Velna Hatchet, MD Primary Cardiologist: Skeet Latch, MD  Primary Electrophysiologist:  None    Patient Profile:   Vanessa Nielsen is a 85 y.o. female with a hx of coronary artery disease who is being seen today for the evaluation of acute diastolic heart failure and chest pain at the request of Dr. Tyrell Antonio.  History of Present Illness:   Vanessa Nielsen is an 85 year old woman with a past medical history significant for coronary artery disease and CABG with a LIMA to the LAD in 2008, hypertension, hyperlipidemia, and spina bifida.  As per review of Dr. Blenda Mounts office notes, she appears to have episodic angina about once every 3 to 4 months which last for a few moments and resolves with nitroglycerin use.  The addition of Imdur was discussed but she declined at that time.  She has been maintained on aspirin, metoprolol, and rosuvastatin.  She had previously been on atorvastatin.  She had a nuclear stress test in 2012 and a Lexiscan Myoview on 11/21/2014 that were negative for ischemia, LVEF 62%.  She presented to the ED on 1231 with complaints of generalized weakness over the preceding 3 days.  She has also been experiencing confusion.  She had an episode of chest pain on the night prior to admission which was alleviated with 2 nitroglycerin tablets.  She was noted to be hypoxic on arrival and cyanotic.  Her daughter in law is present in the room. She and her husband visited the patient this past Sunday and noticed that her legs were swollen. The patient's son who is a Animal nutritionist noticed that his mother's neck pulsations were pronounced.   She lives with her husband who has been having a difficult time as the patient keeps slipping out of chairs and cannot ambulate without assistance.  Pertinent labs: Magnesium low  at 1.3, troponin was nonspecifically elevated (peaking at 0.17 on 08/21/2018 with most recent value of 0.11 today), potassium low at 3.1, creatinine elevated to 1.82 today and 1.98 yesterday, white blood cells mildly elevated at 11.3 and normal yesterday at 9.2, and markedly elevated BNP of 1489.  Chest x-ray demonstrated hypoinflation of the lungs with mild bibasilar subsegmental atelectasis or scarring.  I personally reviewed the ECG performed today at 909 which demonstrated atrial fibrillation, left anterior fascicular block, and T wave abnormalities suggestive of anterolateral ischemia.  The ECG yesterday showed sinus rhythm with PVCs and T wave inversions suggestive of anterior ischemia.  Today, the patient denies chest pain and thinks breathing has improved. She complains of abdominal distention and the urge to urinate (she has a Foley catheter). She is eager to go home.    Past Medical History:  Diagnosis Date  . Allergic rhinitis   . Coronary artery disease    2v CABG @ [facility]  . Hypercholesteremia   . Hyperlipidemia   . Hypertension   . Iatrogenic Cushing's disease ([place])   . Multiple allergies    induced by pain: swelling in UE/face/torso, nausea, hives followed by hypotension unless tx with high dose steroids  . Osteoarthritis   . Scoliosis   . Spina bifida     Past Surgical History:  Procedure Laterality Date  . ABDOMINAL HYSTERECTOMY  1981 or 82  . ANAL RECTAL MANOMETRY N/A 02/13/2014   Procedure: ANAL RECTAL MANOMETRY;  Surgeon: Arta Silence, MD;  Location: [facility];  Service:  Endoscopy;  Laterality: N/A;  . APPENDECTOMY  1960  . CARDIAC CATHETERIZATION  11/2006  . CARDIOVASCULAR STRESS TEST  09/19/2010  . CHOLECYSTECTOMY  2011  . CHOLECYSTECTOMY, LAPAROSCOPIC  05/27/2010  . CORONARY ANGIOPLASTY  11/2006   left anterior descending vessel  . Hardware removed in spine due to loos screws  1997  . REDUCTION MAMMAPLASTY Bilateral 1998  . Surgery for scoliosis  1995  .  TONSILLECTOMY  1943  . TOTAL KNEE ARTHROPLASTY  09/2010   right       Inpatient Medications: Scheduled Meds: . aspirin EC  81 mg Oral Daily  . enoxaparin (LOVENOX) injection  30 mg Subcutaneous QHS  . furosemide  80 mg Intravenous Q12H  . mouth rinse  15 mL Mouth Rinse BID  . metoprolol tartrate  25 mg Oral BID  . polyethylene glycol  17 g Oral BID  . rosuvastatin  40 mg Oral Daily  . sodium chloride flush  3 mL Intravenous Q12H   Continuous Infusions: . sodium chloride     PRN Meds: sodium chloride, acetaminophen **OR** acetaminophen, diazepam, lubiprostone, nitroGLYCERIN, ondansetron **OR** ondansetron (ZOFRAN) IV, sodium chloride flush  Allergies:    Allergies  Allergen Reactions  . Cardura [Doxazosin Mesylate]     Doesn't recall  . Clarithromycin     Doesn't recall  . Erythromycin     Doesn't recall  . Guaifenesin     Doesn't recall  . Ketorolac Tromethamine     Doesn't recall    Social History:   Social History   Socioeconomic History  . Marital status: Married    Spouse name: Not on file  . Number of children: Not on file  . Years of education: Not on file  . Highest education level: Not on file  Occupational History  . Occupation: retired    Fish farm manager: RETIRED  Social Needs  . Financial resource strain: Not on file  . Food insecurity:    Worry: Not on file    Inability: Not on file  . Transportation needs:    Medical: Not on file    Non-medical: Not on file  Tobacco Use  . Smoking status: Never Smoker  . Smokeless tobacco: Never Used  . Tobacco comment: married, lives with spouse. 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 Alcohol use: No  . Drug use: Not on file  . Sexual activity: Not on file  Lifestyle  . Physical activity:    Days per week: Not on file    Minutes per session: Not on file  . Stress: Not on file  Relationships  . Social connections:    Talks on phone: Not on file      Gets together: Not on file    Attends religious service: Not on file    Active member of club or organization: Not on file    Attends meetings of clubs or organizations: Not on file    Relationship status: Not on file  . Intimate partner violence:    Fear of current or ex partner: Not on file    Emotionally abused: Not on file    Physically abused: Not on file    Forced sexual activity: Not on file  Other Topics Concern  . Not on file  Social History Narrative  . Not on file    Family History:    Family History  Problem Relation Age of Onset  . Arthritis Mother   . Cancer Father 48  colon  . Arthritis Father   . Heart disease Other   . Hypertension Other   . Hypertension Maternal Aunt   . Heart attack Neg Hx   . Stroke Neg Hx      ROS:  Please see the history of present illness.   All other ROS reviewed and negative.     Physical Exam/Data:   Vitals:   09/15/18 0600 09/15/18 0800 09/15/18 0920 09/15/18 1200  BP:  (!) 165/83 (!) 145/77   Pulse:  91 98   Resp:  20    Temp:  98.6 F (37 C)  98.9 F (37.2 C)  TempSrc:  Oral  Oral  SpO2: 90% 94%    Weight:      Height:        Intake/Output Summary (Last 24 hours) at 09/15/2018 1225 Last data filed at 09/15/2018 1100 Gross per 24 hour  Intake -  Output 1700 ml  Net -1700 ml   Filed Weights   09/01/2018 2103 08/19/2018 2114 09/15/18 0500  Weight: 91 kg 91 kg 90.1 kg   Body mass index is 37.53 kg/m.  General:  Well nourished, well developed, in no acute distress HEENT: normal Lymph: no adenopathy Neck:  JVP 10 cm Endocrine:  No thryomegaly Cardiac:  normal S1, S2; RRR; no murmur  Lungs:  Diminished sounds b/l without wheezes Abd: soft, nontender, no hepatomegaly  Ext: 1+ pitting bilateral lower extremity edema Skin: warm and dry  Neuro:  CNs 2-12 intact, no focal abnormalities noted Psych:  Normal affect   EKG:  Reviewed above Telemetry:  Telemetry was personally reviewed and demonstrates:  Sinus  rhythm with PAC's and PVC's at present, previously in atrial fibrillation  Relevant CV Studies: Reviewed above.  Echocardiogram pending.  Laboratory Data:  Chemistry Recent Labs  Lab 09/05/2018 1159 09/15/18 0053  NA 141 142  K 3.7 3.1*  CL 103 98  CO2 26 31  GLUCOSE 109* 81  BUN 32* 28*  CREATININE 1.98* 1.82*  CALCIUM 7.9* 8.0*  GFRNONAA 23* 25*  GFRAA 27* 29*  ANIONGAP 12 13    Recent Labs  Lab 09/02/2018 1159  PROT 5.4*  ALBUMIN 3.1*  AST 47*  ALT 44  ALKPHOS 80  BILITOT 0.8   Hematology Recent Labs  Lab 09/07/2018 1159 09/15/18 0053  WBC 9.2 11.3*  RBC 4.70 4.51  HGB 13.0 12.6  HCT 43.3 41.4  MCV 92.1 91.8  MCH 27.7 27.9  MCHC 30.0 30.4  RDW 15.8* 15.8*  PLT 312 283   Cardiac Enzymes Recent Labs  Lab 09/09/2018 1159 09/05/2018 1853 09/15/18 0053 09/15/18 0654  TROPONINI 0.17* 0.15* 0.14* 0.11*   No results for input(s): TROPIPOC in the last 168 hours.  BNP Recent Labs  Lab 08/19/2018 1159  BNP 1,489.0*    DDimer No results for input(s): DDIMER in the last 168 hours.  Radiology/Studies:  Ct Head Wo Contrast  Result Date: 08/20/2018 CLINICAL DATA:  Generalized muscle weakness EXAM: CT HEAD WITHOUT CONTRAST TECHNIQUE: Contiguous axial images were obtained from the base of the skull through the vertex without intravenous contrast. COMPARISON:  None. FINDINGS: Brain: No evidence of acute infarction, hemorrhage, hydrocephalus, extra-axial collection or mass lesion/mass effect. Extensive low-density in the cerebral white matter consistent with chronic small vessel ischemia. Mild for age cerebral volume loss Vascular: No hyperdense vessel or unexpected calcification. Skull: Normal. Negative for fracture or focal lesion. Sinuses/Orbits: No acute finding. IMPRESSION: 1. No acute finding. 2. Extensive chronic small vessel ischemia. Electronically Signed  By: Monte Fantasia M.D.   On: 09/10/2018 12:53   Dg Chest Port 1 View  Result Date: 08/30/2018 CLINICAL  DATA:  Cough. EXAM: PORTABLE CHEST 1 VIEW COMPARISON:  Radiographs of Feb 02, 2017. FINDINGS: Stable cardiomegaly. Atherosclerosis of thoracic aorta is noted. No pneumothorax is noted. Hypoinflation of the lungs is noted with mild bibasilar subsegmental atelectasis or scarring. Bony thorax is unremarkable. IMPRESSION: Hypoinflation of the lungs with mild bibasilar subsegmental atelectasis or scarring. Aortic Atherosclerosis (ICD10-I70.0). Electronically Signed   By: Marijo Conception, M.D.   On: 09/13/2018 12:07    Assessment and Plan:   1.  Acute diastolic heart failure/acute hypoxemic respiratory failure: Currently on IV Lasix 80 mg twice daily.  BNP markedly elevated as noted above.  She is overall -1700 cc. Symptomatically improved. Echocardiogram has been ordered and is pending.  Blood pressure is mildly elevated.  I would continue current diuretic regimen.  2.  Coronary artery disease: History of CABG in 2008.  Low risk nuclear stress test in 2016 as noted above.  Troponins are nonspecifically elevated and indicative of demand ischemia in the context of acute diastolic heart failure/acute hypoxemic respiratory failure.  Continue aspirin, Lopressor, and rosuvastatin. An outpatient nuclear stress test can be considered after following up with her primary cardiologist.  3.  Hypertension: Blood pressure is mildly elevated.  This will need continued monitoring in the context of IV diuretic requirement.  4.  Hyperlipidemia: Continue rosuvastatin 40 mg.  Had been on Lipitor in the past with suboptimal lipid control.  LDL has been 116 prior to substitution with rosuvastatin on 03/31/2018.  5.  Paroxysmal atrial fibrillation: No noted prior history of this.  She is currently back in sinus rhythm.  She is both hypokalemic and hypomagnesemic which may have contributed to this along with acute diastolic heart failure. CHADSVASC score is 6 thus thromboembolic risk is elevated.  This may have been an isolated  instance but if there is recurrence in spite of normalization of potassium and magnesium, I would recommend stopping aspirin and instituting systemic anticoagulation therapy, perhaps with apixaban.  6.  Acute on chronic renal failure stage II: Initial creatinine was 1.98 yesterday and is down to 1.82 today with ongoing diuresis.  This will need continued monitoring.  7.  Sleep disordered breathing: She will need an outpatient sleep study.  She is at high risk for obstructive sleep apnea.  8. Hypokalemia: Being replaced. Likely contributed to arrhythmia.  9. Hypomagnesemia: Likely contributed to arrhythmia. I will start mag oxide 400 mg daily.    For questions or updates, please contact [place] Please consult [email or web address] for contact info under   Time spent: 50 minutes.  Signed, Kate Sable, MD  09/15/2018 12:25 PM

## 2018-09-15 DEATH — deceased

## 2018-09-16 ENCOUNTER — Encounter (HOSPITAL_COMMUNITY): Payer: Self-pay | Admitting: Cardiology

## 2018-09-16 DIAGNOSIS — R7989 Other specified abnormal findings of blood chemistry: Secondary | ICD-10-CM

## 2018-09-16 LAB — MAGNESIUM: Magnesium: 1.5 mg/dL — ABNORMAL LOW (ref 1.7–2.4)

## 2018-09-16 LAB — CBC
HEMATOCRIT: 43.5 % (ref 36.0–46.0)
HEMOGLOBIN: 13.3 g/dL (ref 12.0–15.0)
MCH: 27.1 pg (ref 26.0–34.0)
MCHC: 30.6 g/dL (ref 30.0–36.0)
MCV: 88.8 fL (ref 80.0–100.0)
Platelets: 293 10*3/uL (ref 150–400)
RBC: 4.9 MIL/uL (ref 3.87–5.11)
RDW: 15.7 % — ABNORMAL HIGH (ref 11.5–15.5)
WBC: 8.5 10*3/uL (ref 4.0–10.5)
nRBC: 0 % (ref 0.0–0.2)

## 2018-09-16 LAB — BASIC METABOLIC PANEL
Anion gap: 17 — ABNORMAL HIGH (ref 5–15)
Anion gap: 12 (ref 5–15)
BUN: 24 mg/dL — ABNORMAL HIGH (ref 8–23)
BUN: 25 mg/dL — ABNORMAL HIGH (ref 8–23)
CALCIUM: 8 mg/dL — AB (ref 8.9–10.3)
CHLORIDE: 86 mmol/L — AB (ref 98–111)
CO2: 36 mmol/L — ABNORMAL HIGH (ref 22–32)
CO2: 41 mmol/L — ABNORMAL HIGH (ref 22–32)
Calcium: 7.8 mg/dL — ABNORMAL LOW (ref 8.9–10.3)
Chloride: 88 mmol/L — ABNORMAL LOW (ref 98–111)
Creatinine, Ser: 1.63 mg/dL — ABNORMAL HIGH (ref 0.44–1.00)
Creatinine, Ser: 1.57 mg/dL — ABNORMAL HIGH (ref 0.44–1.00)
GFR calc Af Amer: 35 mL/min — ABNORMAL LOW (ref >60)
GFR calc non Af Amer: 29 mL/min — ABNORMAL LOW (ref >60)
GFR calc non Af Amer: 30 mL/min — ABNORMAL LOW (ref >60)
GFR, EST AFRICAN AMERICAN: 34 mL/min — AB (ref >60)
GLUCOSE: 174 mg/dL — AB (ref 70–99)
Glucose, Bld: 120 mg/dL — ABNORMAL HIGH (ref 70–99)
Potassium: 2.7 mmol/L — CL (ref 3.5–5.1)
Potassium: 2.7 mmol/L — CL (ref 3.5–5.1)
Sodium: 141 mmol/L (ref 135–145)
Sodium: 139 mmol/L (ref 135–145)

## 2018-09-16 LAB — GLUCOSE, CAPILLARY: Glucose-Capillary: 183 mg/dL — ABNORMAL HIGH (ref 70–99)

## 2018-09-16 MED ORDER — DIAZEPAM 5 MG PO TABS
5.0000 mg | ORAL_TABLET | Freq: Two times a day (BID) | ORAL | Status: DC | PRN
  Administered 2018-09-16 – 2018-09-17 (×2): 5 mg via ORAL
  Filled 2018-09-16 (×2): qty 1

## 2018-09-16 MED ORDER — MAGNESIUM OXIDE 400 (241.3 MG) MG PO TABS
400.0000 mg | ORAL_TABLET | Freq: Two times a day (BID) | ORAL | Status: DC
  Administered 2018-09-16 – 2018-09-17 (×2): 400 mg via ORAL
  Filled 2018-09-16 (×3): qty 1

## 2018-09-16 MED ORDER — POTASSIUM CHLORIDE 10 MEQ/100ML IV SOLN
10.0000 meq | INTRAVENOUS | Status: AC
  Administered 2018-09-16 (×2): 10 meq via INTRAVENOUS
  Filled 2018-09-16 (×2): qty 100

## 2018-09-16 MED ORDER — POTASSIUM CHLORIDE CRYS ER 20 MEQ PO TBCR
30.0000 meq | EXTENDED_RELEASE_TABLET | ORAL | Status: AC
  Administered 2018-09-16 (×2): 30 meq via ORAL
  Filled 2018-09-16 (×2): qty 1

## 2018-09-16 MED ORDER — POTASSIUM CHLORIDE CRYS ER 20 MEQ PO TBCR
40.0000 meq | EXTENDED_RELEASE_TABLET | Freq: Once | ORAL | Status: AC
  Administered 2018-09-16: 40 meq via ORAL
  Filled 2018-09-16: qty 2

## 2018-09-16 MED ORDER — POTASSIUM CHLORIDE CRYS ER 20 MEQ PO TBCR
40.0000 meq | EXTENDED_RELEASE_TABLET | ORAL | Status: AC
  Administered 2018-09-16 (×2): 40 meq via ORAL
  Filled 2018-09-16 (×2): qty 2

## 2018-09-16 MED ORDER — MAGNESIUM SULFATE 2 GM/50ML IV SOLN
2.0000 g | Freq: Once | INTRAVENOUS | Status: AC
  Administered 2018-09-16: 2 g via INTRAVENOUS
  Filled 2018-09-16: qty 50

## 2018-09-16 MED ORDER — POTASSIUM CHLORIDE 10 MEQ/100ML IV SOLN: 10.0000 meq | INTRAVENOUS | Status: DC

## 2018-09-16 NOTE — Progress Notes (Signed)
Progress Note  Patient Name: Vanessa Nielsen Date of Encounter: 09/16/2018  Primary Cardiologist: Skeet Latch, MD   Subjective   Today complains of IV site pain, RN is checking, no chest pain and no SOB.   Inpatient Medications    Scheduled Meds: . apixaban  2.5 mg Oral BID  . furosemide  80 mg Intravenous Q12H  . magnesium oxide  400 mg Oral Daily  . mouth rinse  15 mL Mouth Rinse BID  . metoprolol tartrate  50 mg Oral BID  . polyethylene glycol  17 g Oral BID  . potassium chloride  30 mEq Oral Q4H  . rosuvastatin  40 mg Oral Daily  . sodium chloride flush  3 mL Intravenous Q12H  . vitamin B-12  100 mcg Oral Daily   Continuous Infusions: . sodium chloride    . potassium chloride     PRN Meds: sodium chloride, acetaminophen **OR** acetaminophen, diazepam, lubiprostone, nitroGLYCERIN, ondansetron **OR** ondansetron (ZOFRAN) IV, sodium chloride flush   Vital Signs    Vitals:   09/16/18 0200 09/16/18 0300 09/16/18 0323 09/16/18 0400  BP: 126/63 115/63  129/80  Pulse: 71 72  78  Resp: (!) 23 19  (!) 9  Temp:   97.9 F (36.6 C)   TempSrc:   Axillary   SpO2: 99% 96%  94%  Weight:      Height:        Intake/Output Summary (Last 24 hours) at 09/16/2018 0834 Last data filed at 09/16/2018 0700 Gross per 24 hour  Intake 410 ml  Output 2750 ml  Net -2340 ml   Filed Weights   09/10/2018 2103 09/13/2018 2114 09/15/18 0500  Weight: 91 kg 91 kg 90.1 kg    Telemetry    A flutter and at times SR.   - Personally Reviewed  ECG    No new since yesterday - Personally Reviewed  Physical Exam   GEN: No acute distress.   Neck: No JVD, sitting up in bed Cardiac: RRR, no murmurs, rubs, or gallops.  Respiratory: Clear to auscultation bilaterally, ant. GI: Soft, nontender, non-distended  MS: No pitting edema of lower ext; + skin irritations. Neuro:  Nonfocal  Psych: Normal affect   Labs    Chemistry Recent Labs  Lab 09/04/2018 1159 09/15/18 0053 09/16/18 0317    NA 141 142 141  K 3.7 3.1* 2.7*  CL 103 98 88*  CO2 26 31 36*  GLUCOSE 109* 81 120*  BUN 32* 28* 24*  CREATININE 1.98* 1.82* 1.63*  CALCIUM 7.9* 8.0* 8.0*  PROT 5.4*  --   --   ALBUMIN 3.1*  --   --   AST 47*  --   --   ALT 44  --   --   ALKPHOS 80  --   --   BILITOT 0.8  --   --   GFRNONAA 23* 25* 29*  GFRAA 27* 29* 34*  ANIONGAP 12 13 17*     Hematology Recent Labs  Lab 09/06/2018 1159 09/15/18 0053 09/16/18 0317  WBC 9.2 11.3* 8.5  RBC 4.70 4.51 4.90  HGB 13.0 12.6 13.3  HCT 43.3 41.4 43.5  MCV 92.1 91.8 88.8  MCH 27.7 27.9 27.1  MCHC 30.0 30.4 30.6  RDW 15.8* 15.8* 15.7*  PLT 312 283 293    Cardiac Enzymes Recent Labs  Lab 09/11/2018 1159 08/24/2018 1853 09/15/18 0053 09/15/18 0654  TROPONINI 0.17* 0.15* 0.14* 0.11*   No results for input(s): TROPIPOC in the last  168 hours.   BNP Recent Labs  Lab 08/18/2018 1159  BNP 1,489.0*     DDimer No results for input(s): DDIMER in the last 168 hours.   Radiology    Ct Head Wo Contrast  Result Date: 08/22/2018 CLINICAL DATA:  Generalized muscle weakness EXAM: CT HEAD WITHOUT CONTRAST TECHNIQUE: Contiguous axial images were obtained from the base of the skull through the vertex without intravenous contrast. COMPARISON:  None. FINDINGS: Brain: No evidence of acute infarction, hemorrhage, hydrocephalus, extra-axial collection or mass lesion/mass effect. Extensive low-density in the cerebral white matter consistent with chronic small vessel ischemia. Mild for age cerebral volume loss Vascular: No hyperdense vessel or unexpected calcification. Skull: Normal. Negative for fracture or focal lesion. Sinuses/Orbits: No acute finding. IMPRESSION: 1. No acute finding. 2. Extensive chronic small vessel ischemia. Electronically Signed   By: Monte Fantasia M.D.   On: 09/06/2018 12:53   Dg Chest Port 1 View  Result Date: 09/06/2018 CLINICAL DATA:  Cough. EXAM: PORTABLE CHEST 1 VIEW COMPARISON:  Radiographs of Feb 02, 2017.  FINDINGS: Stable cardiomegaly. Atherosclerosis of thoracic aorta is noted. No pneumothorax is noted. Hypoinflation of the lungs is noted with mild bibasilar subsegmental atelectasis or scarring. Bony thorax is unremarkable. IMPRESSION: Hypoinflation of the lungs with mild bibasilar subsegmental atelectasis or scarring. Aortic Atherosclerosis (ICD10-I70.0). Electronically Signed   By: Marijo Conception, M.D.   On: 08/28/2018 12:07    Cardiac Studies   Echo 09/15/2018 Study Conclusions  - Left ventricle: The cavity size was normal. There was mild focal   basal hypertrophy of the septum. Systolic function was normal.   The estimated ejection fraction was in the range of 60% to 65%.   Wall motion was normal; there were no regional wall motion   abnormalities. - Ventricular septum: The contour showed diastolic flattening. - Aortic valve: Trileaflet; mildly thickened, mildly calcified   leaflets. - Left atrium: The atrium was mildly dilated. - Right ventricle: The cavity size was moderately dilated. Wall   thickness was normal. - Right atrium: The atrium was severely dilated. - Tricuspid valve: There was moderate regurgitation. - Pulmonary arteries: Systolic pressure was moderately to severely   increased. PA peak pressure: 68 mm Hg (S).   Patient Profile     85 y.o. female with a hx of coronary artery disease, hx CABG 2008, chronic angina, HTN, HLD and spina bifida, last nuc 2016 neg for ischemia.  Now admitted with chest pain, weakness, hypoxia and confusion also now with a fib. Was in SR on admit.    Assessment & Plan    Acute diastolic HF in combination with respiratory failure.  On IV lasix 80 BID.  --elevated BNP 1489 on admit -- negative 3540 since admit.  Wt yest was down 1 Kg no weight today.  Continue to diuresis  --Cr  1.63 down from 1.82  --hypokalemia at 2.7 today and mag 1.5--replacing both by IM  --echo with normal EF and no RWMA, mild focal basal hypertrophy of the  septum., LA mildly dilated and RA severely dilated, Mod TR and PA pk pressure 68 mmHg. ( no prior echo)   CAD with CABG in 2008 in [place] 2 Vessel, low risk nuc 2016.   --pk troponin 0.17 now back 0.11.  --echo with no RWMA --continue statin, asa and BB.  -- possible outpt nuc per primary cardiologist.  New PAF/A flutter --has been in and out CHA2DS2VASc of 6 may be with acute illness but may have been in as  outpt.  Eliquis added last evening and ASA stopped.  2.5 mg BID also BB increased.  May need higher dose vs adding dilt with normal EF.   HTN  BP 114/55 to 151/103   HLD conintue crestor   CKD-2-3 with Cr pk of 1.98 improved today  Sleep disordered breathing - will need outpt sleep study.  Hypokalemia and hypo Mg+ per IM replacing      For questions or updates, please contact [place] Please consult [email or web address] for contact info under        Signed, Cecilie Kicks, NP  09/16/2018, 8:34 AM

## 2018-09-16 NOTE — Discharge Instructions (Signed)
Information on my medicine - ELIQUIS (apixaban)  This medication education was reviewed with me or my healthcare representative as part of my discharge preparation.  The pharmacist that spoke with me during my hospital stay was:  Minda Ditto, [facility]  Why was Eliquis prescribed for you? Eliquis was prescribed for you to reduce the risk of blood clots forming after orthopedic surgery.    What do You need to know about Eliquis? Take your Eliquis TWICE DAILY - one tablet in the morning and one tablet in the evening with or without food.  It would be best to take the dose about the same time each day.  If you have difficulty swallowing the tablet whole please discuss with your pharmacist how to take the medication safely.  Take Eliquis exactly as prescribed by your doctor and DO NOT stop taking Eliquis without talking to the doctor who prescribed the medication.  Stopping without other medication to take the place of Eliquis may increase your risk of developing a clot.  After discharge, you should have regular check-up appointments with your healthcare provider that is prescribing your Eliquis.  What do you do if you miss a dose? If a dose of ELIQUIS is not taken at the scheduled time, take it as soon as possible on the same day and twice-daily administration should be resumed.  The dose should not be doubled to make up for a missed dose.  Do not take more than one tablet of ELIQUIS at the same time.  Important Safety Information A possible side effect of Eliquis is bleeding. You should call your healthcare provider right away if you experience any of the following: ? Bleeding from an injury or your nose that does not stop. ? Unusual colored urine (red or dark brown) or unusual colored stools (red or black). ? Unusual bruising for unknown reasons. ? A serious fall or if you hit your head (even if there is no bleeding).  Some medicines may interact with Eliquis and might increase your  risk of bleeding or clotting while on Eliquis. To help avoid this, consult your healthcare provider or pharmacist prior to using any new prescription or non-prescription medications, including herbals, vitamins, non-steroidal anti-inflammatory drugs (NSAIDs) and supplements.  This website has more information on Eliquis (apixaban): http://www.eliquis.com/eliquis/home

## 2018-09-16 NOTE — Progress Notes (Signed)
PROGRESS NOTE    Vanessa Nielsen  FOY:[id number] DOB: [birth year] DOA: 09/01/2018 PCP: Velna Hatchet, MD    Brief Narrative: Vanessa Nielsen is a 85 y.o. female with past medical history significant for spina bifida,, hypertension, coronary artery disease status post CABG 2008, last stress test was 2016 and was negative, patient at baseline is able to take a few steps and transition from bed to wheelchair from wheelchair to her car.  Patient presented with progressive weakness, recurrent fall, worsening shortness of breath and hypoxemia.  Assessment & Plan:   Principal Problem:   Respiratory failure with hypoxia ([place]) Active Problems:   Spina bifida of lumbar spine (HCC)   Heart failure, acute diastolic (HCC)   Respiratory failure, acute ([place])   Heart failure ([place])  1-Acute hypoxic respiratory failure: In the setting of acute diastolic heart failure exacerbation. Patient presented with hypoxemia, volume overload, elevated BNP. Continue with 80 mg IV twice daily. Remain in the stepdown unit today patient currently on 10 L of high flow oxygen.. Weight down to 198.  Urine output 2.7 L. ECHO with increase pulmonary Hypertension, mitral regurgitation:  await cardiology recommendations.    2-Acute on chronic renal failure stage II; creatinine per records 0.95. Patient presented with a creatinine level 1.9. Suspect acute renal failure related to cardiorenal syndrome. Monitor on IV Lasix. Cr trending down to 1.6 today.   3-Generalized weakness; likely related to hypoxemia and volume overload. She will need PT eval.  4-Initial Concern for sepsis; was rule out ED physician was concerned with sepsis initially.  Patient is afebrile, no leukocytosis. She received a dose of IV Levaquin.  I will hold any further antibiotics at this point. Follow blood cultures.  No growth to date  5-Hypertension; hold Norvasc, Hyzaar for now. Continue with metoprolol.   6-Elevation troponin;   EKG changes. Patient has history of chest pain on and off.  Per last cardiology note she experience for episode of chest pain during the year. Patient is currently chest pain-free. Continue with metoprolol, aspirin, statins. Cardiology consulted  7-Paroxysmal A. Fib; patient converted to A. fib rate control.  Cardiology will also see in consultation. Started on eliquis.   8-Acute encephalopathy; likely related to acute illness, hypoxemia, heart failure exacerbation.  B12 level low normal. Start B 12 supplement.   Hypomagnesemia; Order IV magnesium  Hypokalemia; replete  orally.   Morbid obesity; diet education.   Estimated body mass index is 37.53 kg/m as calculated from the following:   Height as of this encounter: 5\' 1"  (1.549 m).   Weight as of this encounter: 90.1 kg.   DVT prophylaxis: Lovenox Code Status: DNR Family Communication: Daughter in law at bedside Disposition Plan: Admit in the stepdown unit, patient required high flow oxygen   Consultants:   Cardiology   Procedures:  Echo   Antimicrobials:   None   Subjective: She is still confuse. Denies worsening dyspnea, not breathing at baseline.     Objective: Vitals:   09/16/18 0200 09/16/18 0300 09/16/18 0323 09/16/18 0400  BP: 126/63 115/63  129/80  Pulse: 71 72  78  Resp: (!) 23 19  (!) 9  Temp:   97.9 F (36.6 C)   TempSrc:   Axillary   SpO2: 99% 96%  94%  Weight:      Height:        Intake/Output Summary (Last 24 hours) at 09/16/2018 0832 Last data filed at 09/16/2018 0700 Gross per 24 hour  Intake 410 ml  Output  2750 ml  Net -2340 ml   Filed Weights   09/09/2018 2103 08/29/2018 2114 09/15/18 0500  Weight: 91 kg 91 kg 90.1 kg    Examination:  General exam: NAD Respiratory system: Bilateral crackles.  Cardiovascular system: S 1, S 2 IRR Gastrointestinal system:  BS present, soft, nt Central nervous system: Confuse Extremities: plus 1 edema Skin: No rashes.   Data Reviewed: I  have personally reviewed following labs and imaging studies  CBC: Recent Labs  Lab 08/23/2018 1159 09/15/18 0053 09/16/18 0317  WBC 9.2 11.3* 8.5  NEUTROABS 6.8  --   --   HGB 13.0 12.6 13.3  HCT 43.3 41.4 43.5  MCV 92.1 91.8 88.8  PLT 312 283 509   Basic Metabolic Panel: Recent Labs  Lab 08/22/2018 1159 09/15/18 0053 09/15/18 1002 09/16/18 0317  NA 141 142  --  141  K 3.7 3.1*  --  2.7*  CL 103 98  --  88*  CO2 26 31  --  36*  GLUCOSE 109* 81  --  120*  BUN 32* 28*  --  24*  CREATININE 1.98* 1.82*  --  1.63*  CALCIUM 7.9* 8.0*  --  8.0*  MG  --   --  1.3* 1.5*   GFR: Estimated Creatinine Clearance: 27.2 mL/min (A) (by C-G formula based on SCr of 1.63 mg/dL (H)). Liver Function Tests: Recent Labs  Lab 09/11/2018 1159  AST 47*  ALT 44  ALKPHOS 80  BILITOT 0.8  PROT 5.4*  ALBUMIN 3.1*   No results for input(s): LIPASE, AMYLASE in the last 168 hours. No results for input(s): AMMONIA in the last 168 hours. Coagulation Profile: No results for input(s): INR, PROTIME in the last 168 hours. Cardiac Enzymes: Recent Labs  Lab 08/30/2018 1159 09/01/2018 1853 09/15/18 0053 09/15/18 0654  TROPONINI 0.17* 0.15* 0.14* 0.11*   BNP (last 3 results) No results for input(s): PROBNP in the last 8760 hours. HbA1C: No results for input(s): HGBA1C in the last 72 hours. CBG: No results for input(s): GLUCAP in the last 168 hours. Lipid Profile: No results for input(s): CHOL, HDL, LDLCALC, TRIG, CHOLHDL, LDLDIRECT in the last 72 hours. Thyroid Function Tests: No results for input(s): TSH, T4TOTAL, FREET4, T3FREE, THYROIDAB in the last 72 hours. Anemia Panel: Recent Labs    09/15/18 1002  VITAMINB12 229   Sepsis Labs: Recent Labs  Lab 09/02/2018 1201 08/19/2018 1457  LATICACIDVEN 1.03 1.11    Recent Results (from the past 240 hour(s))  Blood Culture (routine x 2)     Status: None (Preliminary result)   Collection Time: 08/26/2018 12:03 PM  Result Value Ref Range Status    Specimen Description   Final    BLOOD BLOOD RIGHT FOREARM Performed at [facility] [address] [place], [place] [address]    Special Requests   Final    BOTTLES DRAWN AEROBIC AND ANAEROBIC Blood Culture adequate volume Performed at [facility] [address] [place], [place] [address]    Culture   Final    NO GROWTH 1 DAY Performed at [facility], [place] [address] [place], [place] [address]    Report Status PENDING  Incomplete  Blood Culture (routine x 2)     Status: None (Preliminary result)   Collection Time: 08/25/2018 12:03 PM  Result Value Ref Range Status   Specimen Description   Final    BLOOD LEFT ANTECUBITAL Performed at [facility] [address]  [place], [place] [address]    Special Requests   Final    BOTTLES DRAWN AEROBIC AND ANAEROBIC Blood Culture results may not be optimal due to an excessive volume of blood received in culture bottles Performed at [facility] [address] [place], [place] [address]    Culture   Final    NO GROWTH 1 DAY Performed at [facility], [place] [address], [place], [place] [address]    Report Status PENDING  Incomplete  MRSA PCR Screening     Status: None   Collection Time: 09/04/2018  9:34 PM  Result Value Ref Range Status   MRSA by PCR NEGATIVE NEGATIVE Final    Comment:        The GeneXpert MRSA Assay (FDA approved for NASAL specimens only), is one component of a comprehensive MRSA colonization surveillance program. It is not intended to diagnose MRSA infection nor to guide or monitor treatment for MRSA infections. Performed at [facility], [place] [address] [place], [place] [address]          Radiology Studies: Ct Head Wo Contrast  Result Date: 08/25/2018 CLINICAL DATA:  Generalized muscle weakness EXAM: CT HEAD WITHOUT CONTRAST TECHNIQUE: Contiguous axial images were obtained from the base of the skull  through the vertex without intravenous contrast. COMPARISON:  None. FINDINGS: Brain: No evidence of acute infarction, hemorrhage, hydrocephalus, extra-axial collection or mass lesion/mass effect. Extensive low-density in the cerebral white matter consistent with chronic small vessel ischemia. Mild for age cerebral volume loss Vascular: No hyperdense vessel or unexpected calcification. Skull: Normal. Negative for fracture or focal lesion. Sinuses/Orbits: No acute finding. IMPRESSION: 1. No acute finding. 2. Extensive chronic small vessel ischemia. Electronically Signed   By: Monte Fantasia M.D.   On: 09/01/2018 12:53   Dg Chest Port 1 View  Result Date: 08/16/2018 CLINICAL DATA:  Cough. EXAM: PORTABLE CHEST 1 VIEW COMPARISON:  Radiographs of Feb 02, 2017. FINDINGS: Stable cardiomegaly. Atherosclerosis of thoracic aorta is noted. No pneumothorax is noted. Hypoinflation of the lungs is noted with mild bibasilar subsegmental atelectasis or scarring. Bony thorax is unremarkable. IMPRESSION: Hypoinflation of the lungs with mild bibasilar subsegmental atelectasis or scarring. Aortic Atherosclerosis (ICD10-I70.0). Electronically Signed   By: Marijo Conception, M.D.   On: 08/17/2018 12:07        Scheduled Meds: . apixaban  2.5 mg Oral BID  . furosemide  80 mg Intravenous Q12H  . magnesium oxide  400 mg Oral Daily  . mouth rinse  15 mL Mouth Rinse BID  . metoprolol tartrate  50 mg Oral BID  . polyethylene glycol  17 g Oral BID  . potassium chloride  30 mEq Oral Q4H  . rosuvastatin  40 mg Oral Daily  . sodium chloride flush  3 mL Intravenous Q12H  . vitamin B-12  100 mcg Oral Daily   Continuous Infusions: . sodium chloride    . potassium chloride       LOS: 1 day    Time spent: 35 minutes.     Elmarie Shiley, MD [facility] Pager [phone]  If 7PM-7AM, please contact night-coverage [email or web address] Password [facility] 09/16/2018, 8:32 AM

## 2018-09-17 ENCOUNTER — Inpatient Hospital Stay (HOSPITAL_COMMUNITY): Payer: Medicare Other

## 2018-09-17 DIAGNOSIS — Z66 Do not resuscitate: Secondary | ICD-10-CM

## 2018-09-17 DIAGNOSIS — J9601 Acute respiratory failure with hypoxia: Secondary | ICD-10-CM

## 2018-09-17 DIAGNOSIS — Z9989 Dependence on other enabling machines and devices: Secondary | ICD-10-CM

## 2018-09-17 DIAGNOSIS — I5033 Acute on chronic diastolic (congestive) heart failure: Secondary | ICD-10-CM

## 2018-09-17 LAB — BASIC METABOLIC PANEL
Anion gap: 12 (ref 5–15)
BUN: 20 mg/dL (ref 8–23)
CALCIUM: 7.5 mg/dL — AB (ref 8.9–10.3)
CO2: 34 mmol/L — ABNORMAL HIGH (ref 22–32)
CREATININE: 1.42 mg/dL — AB (ref 0.44–1.00)
Chloride: 89 mmol/L — ABNORMAL LOW (ref 98–111)
GFR calc Af Amer: 40 mL/min — ABNORMAL LOW (ref >60)
GFR calc non Af Amer: 34 mL/min — ABNORMAL LOW (ref >60)
Glucose, Bld: 129 mg/dL — ABNORMAL HIGH (ref 70–99)
Potassium: 3.9 mmol/L (ref 3.5–5.1)
Sodium: 135 mmol/L (ref 135–145)

## 2018-09-17 LAB — CBC
HCT: 45.3 % (ref 36.0–46.0)
Hemoglobin: 13.4 g/dL (ref 12.0–15.0)
MCH: 27.1 pg (ref 26.0–34.0)
MCHC: 29.6 g/dL — ABNORMAL LOW (ref 30.0–36.0)
MCV: 91.5 fL (ref 80.0–100.0)
Platelets: 293 10*3/uL (ref 150–400)
RBC: 4.95 MIL/uL (ref 3.87–5.11)
RDW: 16.1 % — AB (ref 11.5–15.5)
WBC: 9.6 10*3/uL (ref 4.0–10.5)
nRBC: 0 % (ref 0.0–0.2)

## 2018-09-17 LAB — BLOOD GAS, ARTERIAL
Acid-Base Excess: 13.9 mmol/L — ABNORMAL HIGH (ref 0.0–2.0)
Bicarbonate: 38.8 mmol/L — ABNORMAL HIGH (ref 20.0–28.0)
Delivery systems: POSITIVE
Drawn by: 270211
EXPIRATORY PAP: 6
FIO2: 1
Inspiratory PAP: 12
Mode: POSITIVE
O2 Saturation: 98.4 %
PATIENT TEMPERATURE: 37
PH ART: 7.527 — AB (ref 7.350–7.450)
pCO2 arterial: 47 mmHg (ref 32.0–48.0)
pO2, Arterial: 97.5 mmHg (ref 83.0–108.0)

## 2018-09-17 LAB — MAGNESIUM: Magnesium: 2 mg/dL (ref 1.7–2.4)

## 2018-09-17 LAB — URINALYSIS, ROUTINE W REFLEX MICROSCOPIC
Bilirubin Urine: NEGATIVE
Glucose, UA: NEGATIVE mg/dL
Ketones, ur: NEGATIVE mg/dL
Leukocytes, UA: NEGATIVE
Nitrite: NEGATIVE
Protein, ur: NEGATIVE mg/dL
Specific Gravity, Urine: 1.006 (ref 1.005–1.030)
pH: 8 (ref 5.0–8.0)

## 2018-09-17 MED ORDER — POTASSIUM CHLORIDE CRYS ER 20 MEQ PO TBCR
40.0000 meq | EXTENDED_RELEASE_TABLET | ORAL | Status: AC
  Administered 2018-09-17 (×2): 40 meq via ORAL
  Filled 2018-09-17 (×2): qty 2

## 2018-09-17 MED ORDER — SODIUM CHLORIDE 0.9 % IV SOLN
1.0000 g | INTRAVENOUS | Status: DC
  Administered 2018-09-17: 1 g via INTRAVENOUS
  Filled 2018-09-17: qty 1
  Filled 2018-09-17: qty 10

## 2018-09-17 MED ORDER — LORAZEPAM 2 MG/ML IJ SOLN
0.5000 mg | Freq: Once | INTRAMUSCULAR | Status: AC
  Administered 2018-09-17: 0.5 mg via INTRAVENOUS
  Filled 2018-09-17: qty 1

## 2018-09-17 NOTE — Progress Notes (Signed)
Progress Note  Patient Name: Vanessa Nielsen Date of Encounter: 09/17/2018  Primary Cardiologist: Skeet Latch, MD   Subjective   No chest pain, but using energy to breathe.  Inpatient Medications    Scheduled Meds: . apixaban  2.5 mg Oral BID  . furosemide  80 mg Intravenous Q12H  . magnesium oxide  400 mg Oral BID  . mouth rinse  15 mL Mouth Rinse BID  . metoprolol tartrate  50 mg Oral BID  . polyethylene glycol  17 g Oral BID  . rosuvastatin  40 mg Oral Daily  . sodium chloride flush  3 mL Intravenous Q12H  . vitamin B-12  100 mcg Oral Daily   Continuous Infusions: . sodium chloride 10 mL/hr at 09/16/18 2052   PRN Meds: sodium chloride, acetaminophen **OR** acetaminophen, diazepam, lubiprostone, nitroGLYCERIN, ondansetron **OR** ondansetron (ZOFRAN) IV, sodium chloride flush   Vital Signs    Vitals:   09/17/18 0326 09/17/18 0400 09/17/18 0600 09/17/18 0608  BP:  121/69 (!) 98/55   Pulse:  (!) 40    Resp:  (!) 25 (!) 27   Temp: 100.3 F (37.9 C)     TempSrc: Oral     SpO2:  96%    Weight:    90.2 kg  Height:        Intake/Output Summary (Last 24 hours) at 09/17/2018 0744 Last data filed at 09/17/2018 0144 Gross per 24 hour  Intake 495.06 ml  Output 1950 ml  Net -1454.94 ml   Filed Weights   08/27/2018 2114 09/15/18 0500 09/17/18 4315  Weight: 91 kg 90.1 kg 90.2 kg    Telemetry    SR - Personally Reviewed  ECG    No new - Personally Reviewed  Physical Exam   GEN: No acute distress.   Neck: No JVD Cardiac: RRR, no murmurs, rubs, or gallops.  Respiratory: + rhonchi to auscultation bilaterally. GI: Soft, nontender, non-distended  MS: No edema except of feet; No deformity. Neuro:  Nonfocal  Psych: Normal affect   Labs    Chemistry Recent Labs  Lab 08/15/2018 1159  09/16/18 0317 09/16/18 1302 09/17/18 0317  NA 141   < > 141 139 135  K 3.7   < > 2.7* 2.7* 3.9  CL 103   < > 88* 86* 89*  CO2 26   < > 36* 41* 34*  GLUCOSE 109*   < >  120* 174* 129*  BUN 32*   < > 24* 25* 20  CREATININE 1.98*   < > 1.63* 1.57* 1.42*  CALCIUM 7.9*   < > 8.0* 7.8* 7.5*  PROT 5.4*  --   --   --   --   ALBUMIN 3.1*  --   --   --   --   AST 47*  --   --   --   --   ALT 44  --   --   --   --   ALKPHOS 80  --   --   --   --   BILITOT 0.8  --   --   --   --   GFRNONAA 23*   < > 29* 30* 34*  GFRAA 27*   < > 34* 35* 40*  ANIONGAP 12   < > 17* 12 12   < > = values in this interval not displayed.     Hematology Recent Labs  Lab 09/15/18 0053 09/16/18 0317 09/17/18 0317  WBC 11.3* 8.5 9.6  RBC 4.51 4.90 4.95  HGB 12.6 13.3 13.4  HCT 41.4 43.5 45.3  MCV 91.8 88.8 91.5  MCH 27.9 27.1 27.1  MCHC 30.4 30.6 29.6*  RDW 15.8* 15.7* 16.1*  PLT 283 293 293    Cardiac Enzymes Recent Labs  Lab 09/04/2018 1159 08/19/2018 1853 09/15/18 0053 09/15/18 0654  TROPONINI 0.17* 0.15* 0.14* 0.11*   No results for input(s): TROPIPOC in the last 168 hours.   BNP Recent Labs  Lab 09/09/2018 1159  BNP 1,489.0*     DDimer No results for input(s): DDIMER in the last 168 hours.   Radiology    No results found.  Cardiac Studies    Echo 09/15/2018 Study Conclusions  - Left ventricle: The cavity size was normal. There was mild focal basal hypertrophy of the septum. Systolic function was normal. The estimated ejection fraction was in the range of 60% to 65%. Wall motion was normal; there were no regional wall motion abnormalities. - Ventricular septum: The contour showed diastolic flattening. - Aortic valve: Trileaflet; mildly thickened, mildly calcified leaflets. - Left atrium: The atrium was mildly dilated. - Right ventricle: The cavity size was moderately dilated. Wall thickness was normal. - Right atrium: The atrium was severely dilated. - Tricuspid valve: There was moderate regurgitation. - Pulmonary arteries: Systolic pressure was moderately to severely increased. PA peak pressure: 68 mm Hg (S).  Patient Profile       85 y.o. female with a hx of coronary artery disease, hx CABG2008, chronic angina, HTN, HLD and spina bifida, last nuc 2016 neg for ischemia.  Now admitted with chest pain, weakness, hypoxia and confusion also now with a fib. Was in SR on admit.    Assessment & Plan    Acute diastolic HF in combination with respiratory failure.  On IV lasix 80 BID.  --elevated BNP 1489 on admit -- negative 4994 since admit.  Wt yest was down 1 Kg and same weight today.  Continue to diuresis  --Cr  1.42;  Yesterday 1.63 down from 1.82  --hypokalemia at 2.7 yesterday and now 3.9,  and mag yesterday 1.5--replaced now 2.0 both by IM  --echo with normal EF and no RWMA, mild focal basal hypertrophy of the septum., LA mildly dilated and RA severely dilated, Mod TR and PA pk pressure 68 mmHg. ( no prior echo)  --resp elevated this AM on non rebreather with sp02 of 97% and now transitioned to BiPAP, CCM has seen, not so much CHF  CXR this AM with hypoventilation.  Will continue lasix as is.  --family meeting with CCM this AM  CAD with CABG in 2008 in [place] 2 Vessel, low risk nuc 2016.   --pk troponin 0.17 now back 0.11.  --echo with no RWMA --continue statin, asa and BB.  -- outpt nuc once improved  New PAF/A flutter --has been in and out CHA2DS2VASc of 6 may be with acute illness but may have been in as outpt.  Eliquis added last evening and ASA stopped.  2.5 mg BID also BB increased.  back to SR yesterday around 10 AM -  SR with PACs   HTN  BP now 98/55    HLD conintue crestor   CKD-2-3 with Cr pk of 1.98 improved today is 1.42  Sleep disordered breathing - will need outpt sleep study.  Hypokalemia and hypo Mg+ per IM replacing          For questions or updates, please contact [place] Please consult [email or web address] for contact info under  Signed, Cecilie Kicks, NP  09/17/2018, 7:44 AM

## 2018-09-17 NOTE — Consult Note (Signed)
PULMONARY / CRITICAL CARE MEDICINE   NAME:  Vanessa Nielsen, MRN:  [id number], DOB:  03/30/1934, LOS: 2 ADMISSION DATE:  08/19/2018, CONSULTATION DATE:  09/17/2018 REFERRING MD:  Tyrell Antonio, CHIEF COMPLAINT:  Acute Respiratory Failure 2/2 Heart Failure  BRIEF HISTORY:    Vanessa Poudrier Crawfordis a 85 y.o.femalewith past medical history significant for spina bifida,, hypertension, coronary artery disease status post CABG 2008,last stress test was 2016and was negative,patient at baseline is able to take a few steps and transition from bed to wheelchair from wheelchair to her car.  Patient presented 09/15/2018 with progressive weakness, recurrent fall, worsening shortness of breath and hypoxemia and confusion, chest pain, and atrial fibrillation . PCCM have been asked to consult regarding  increasing oxygen needs in setting of suspected OSA/OHS and  acute on chronic diastolic heart failure( She was volume overloaded on admission with elevated BNP) HISTORY OF PRESENT ILLNESS   Vanessa Hosea Crawfordis a 85 y.o.femalewith past medical history significant for spina bifida,, hypertension, coronary artery disease status post CABG 2008,last stress test was 2016and was negative,patient at baseline is able to take a few steps and transition from bed to wheelchair from wheelchair to her car.  Patient presented 09/15/2018 with progressive weakness, recurrent fall, worsening shortness of breath and hypoxemia and confusion, chest pain, and atrial fibrillation .She has a history over the last 3 months, per her husband,  of sleeping very deeply at night and being hard to arouse in the mornings. She is also very confused when she awakens and it takes " awhile" to get her back to her baseline.  Her oxygen need has been increasing since admission despite diuresis.BNP was elevated on admission. She is currently on 10 L HF oxygen. She has been tried on BiPAP and she did not tolerate it. CXR shows atelectasis vs infiltrate, and  vascular congestion. She has never been evaluated for OSA or OHS. PCCM have been asked to consult for increasing oxygen needs despite diuresis. PCCM have been asked to consult.  SIGNIFICANT PAST MEDICAL HISTORY     Past Medical History:  Diagnosis Date  . Allergic rhinitis   . Coronary artery disease    2v CABG @ [facility]  . Hypercholesteremia   . Hyperlipidemia   . Hypertension   . Iatrogenic Cushing's disease ([place])   . Multiple allergies    induced by pain: swelling in UE/face/torso, nausea, hives followed by hypotension unless tx with high dose steroids  . Osteoarthritis   . Scoliosis   . Spina bifida     SIGNIFICANT EVENTS:  09/15/2018>> Admission  STUDIES:   Echo 09/15/2018 Study Conclusions  - Left ventricle: The cavity size was normal. There was mild focal basal hypertrophy of the septum. Systolic function was normal. The estimated ejection fraction was in the range of 60% to 65%. Wall motion was normal; there were no regional wall motion abnormalities. - Ventricular septum: The contour showed diastolic flattening. - Aortic valve: Trileaflet; mildly thickened, mildly calcified leaflets. - Left atrium: The atrium was mildly dilated. - Right ventricle: The cavity size was moderately dilated. Wall thickness was normal. - Right atrium: The atrium was severely dilated. - Tricuspid valve: There was moderate regurgitation. - Pulmonary arteries: Systolic pressure was moderately to severely increased. PA peak pressure: 68 mm Hg (S).  09/17/2018 CXR Stable cardiomegaly is noted with central pulmonary vascular congestion. Atherosclerosis of thoracic aorta is noted. Hypoinflation of the lungs is again noted with bibasilar subsegmental atelectasis or possibly infiltrates CULTURES:  08/18/2018 Blood>> No Growth  to date  ANTIBIOTICS:  Ceftriaxone 09/17/2018>>  LINES/TUBES:  PIV  CONSULTANTS:  Cardiology 09/15/2018 Pulmonary 09/17/2018 SUBJECTIVE:  States she has  no problems with her breathing, she does not feel short of breath. She wants to go home and watch TV .  CONSTITUTIONAL: BP 111/65   Pulse 76   Temp 100 F (37.8 C) (Axillary)   Resp (!) 29   Ht 5\' 1"  (1.549 m)   Wt 90.2 kg   SpO2 97%   BMI 37.57 kg/m   I/O last 3 completed shifts: In: 615.1 [P.O.:420; I.V.:59.7; IV Piggyback:135.4] Out: 3000 [Urine:3000]     FiO2 (%):  [100 %] 100 %  PHYSICAL EXAM: General: Awake and alert on 10 L HF oxygen, following commands. Wants to go home, in NAD, appears comfortable Neuro:  Awake and alert, following commands, MAE x 4, confused to time and day HEENT:  NCAT, thick neck, No JVD Cardiovascular:  S1. S2, RRR, No RMG Lungs:  Clear to auscultation bilaterally, diminished per bases Abdomen:  Sofy, NT, ND, BS +, obese Musculoskeletal:  No lower extremity edema noted, no obvious deformities Skin:  Warm and dry, no lesions or rash, no mottling  RESOLVED PROBLEM LIST   ASSESSMENT AND PLAN   Acute Respiratory Failure with Hypoxemia in setting of PAH and acute on chronic diastolic HF CXR 10/20/4268>>  - Stable cardiomegaly  with central pulmonary vascular Congestion Hypoinflation of the lungs is again noted with bibasilar subsegmental atelectasis or possibly infiltrates - Requiring HF [place] at 10 L - Does not tolerate BiPAP - DNR>> No intubation - ABG 09/17/2018   7.52, 47,97.5, 30.8 on BiPAP 100%m I PAP 12, EPAP 6  - Suspect undiagnosed OSA/ OHS Plan: Palliation discussions>> Primary Team have consulted Palliation Team Family and patient are on board. BiPAP prn as patient can tolerate HFNC wean as able for sats > 92% Continue ABX until palliation plans are established>> at that time align plan of care to goals of care Continue Lasix per primary( Net negative 5L since admission) as renal function tolerates Agressive Pulmonary Toilet IS Q1 while awake OOB to chair as able PT/OT if appropriate Goal should be patient comfort If for some  reason patient and family change their minds re: palliation , will need OP follow up for OSA/ OHS and Bi level therapy   Acute on Chronic diastolic HF EF 62-37% PAH>> Peak pressures 68 mm Hg Plan Per cards and Primary team  Acute on Chronic Renal Failure stage II: baseline creatinine 0.95 Suspect 2/2 cardiorenal syndrome Creatinine trending down  1.4 on 1/3 [place] hypokalemia Plan BMET daily Replete electrolytes daily Monitor output  ID Initially concern for sepsis Afebrile WBC 9.6 Atelectasis vs infiltrate per CXR Plan Trend WBC and Fever curve Continue ABX per primary team   SUMMARY OF TODAY'S PLAN:  Pt had trial on BiPAP and she did not tolerate it well. We discussed that the next step for respiratory failure would be intubation and mechanical ventilation, which the family do not want. Patient  wants to go home . In light of significant co-morbidities patient and family desire  palliation conversation. Palliation has beem consulted. Titrate oxygen for comfort, lasix per primary team, agressive pulmonary toilet, palliation team to establish goals of care and align plan of care accordingly.Patient Comfort should be goal.  Best Practice / Goals of Care / Disposition.   DVT PROPHYLAXIS:Eliquis SUP: None NUTRITION:Diet MOBILITY:OOB to chair as tolerated GOALS OF CARE: Palliation consulted, DNR FAMILY DISCUSSIONS: Updated family and  patient 09/17/2018 DISPOSITION ICU for now until palliation discussion, and goals of care established  LABS  Glucose Recent Labs  Lab 09/16/18 1219  GLUCAP 183*    BMET Recent Labs  Lab 09/16/18 0317 09/16/18 1302 09/17/18 0317  NA 141 139 135  K 2.7* 2.7* 3.9  CL 88* 86* 89*  CO2 36* 41* 34*  BUN 24* 25* 20  CREATININE 1.63* 1.57* 1.42*  GLUCOSE 120* 174* 129*    Liver Enzymes Recent Labs  Lab 09/02/2018 1159  AST 47*  ALT 44  ALKPHOS 80  BILITOT 0.8  ALBUMIN 3.1*    Electrolytes Recent Labs  Lab 09/15/18 1002  09/16/18 0317 09/16/18 1302 09/17/18 0317  CALCIUM  --  8.0* 7.8* 7.5*  MG 1.3* 1.5*  --  2.0    CBC Recent Labs  Lab 09/15/18 0053 09/16/18 0317 09/17/18 0317  WBC 11.3* 8.5 9.6  HGB 12.6 13.3 13.4  HCT 41.4 43.5 45.3  PLT 283 293 293    ABG Recent Labs  Lab 09/04/2018 1210 09/17/18 0838  PHART 7.361 7.527*  PCO2ART 48.5* 47.0  PO2ART 112* 97.5    Coag's No results for input(s): APTT, INR in the last 168 hours.  Sepsis Markers Recent Labs  Lab 09/10/2018 1201 09/13/2018 1457  LATICACIDVEN 1.03 1.11    Cardiac Enzymes Recent Labs  Lab 09/08/2018 1853 09/15/18 0053 09/15/18 0654  TROPONINI 0.15* 0.14* 0.11*    PAST MEDICAL HISTORY :   She  has a past medical history of Allergic rhinitis, Coronary artery disease, Hypercholesteremia, Hyperlipidemia, Hypertension, Iatrogenic Cushing's disease ([place]), Multiple allergies, Osteoarthritis, Scoliosis, and Spina bifida.  PAST SURGICAL HISTORY:  She  has a past surgical history that includes Cardiac catheterization (11/2006); Total knee arthroplasty (09/2010); Cholecystectomy, laparoscopic (05/27/2010); Coronary angioplasty (11/2006); Cardiovascular stress test (09/19/2010); Cholecystectomy (2011); Appendectomy (1960); Tonsillectomy (1943); Abdominal hysterectomy (1981 or 82); Surgery for scoliosis (1995); Hardware removed in spine due to loos screws (1997); Anal Rectal manometry (N/A, 02/13/2014); Reduction mammaplasty (Bilateral, 1998); and Coronary artery bypass graft (2008).  Allergies  Allergen Reactions  . Cardura [Doxazosin Mesylate]     Doesn't recall  . Clarithromycin     Doesn't recall  . Erythromycin     Doesn't recall  . Guaifenesin     Doesn't recall  . Ketorolac Tromethamine     Doesn't recall    No current facility-administered medications on file prior to encounter.    Current Outpatient Medications on File Prior to Encounter  Medication Sig  . amLODipine (NORVASC) 5 MG tablet TAKE 1 TABLET BY MOUTH IF  SYSTOLIC BLOOD PRESSURE IS ABOVE 150 (Patient taking differently: Take 5 mg by mouth daily. )  . aspirin 81 MG EC tablet Take 81 mg by mouth daily.    Marland Kitchen Dextrose-Fructose-Sod Citrate (NAUZENE PO) Take 1 tablet by mouth daily as needed (nausea).  . diazepam (VALIUM) 5 MG tablet Take 5 mg by mouth at bedtime as needed (sleep).  Marland Kitchen ibuprofen (ADVIL,MOTRIN) 200 MG tablet Take 600 mg by mouth 2 (two) times daily as needed for moderate pain.  Marland Kitchen losartan-hydrochlorothiazide (HYZAAR) 100-25 MG tablet TAKE 1 TABLET BY MOUTH EVERY DAY  . lubiprostone (AMITIZA) 8 MCG capsule Take 8 mcg by mouth daily as needed for constipation.  . metoprolol tartrate (LOPRESSOR) 50 MG tablet TAKE 1/2 TABLET BY MOUTH 2 TIMES DAILY. (Patient taking differently: Take 25 mg by mouth 2 (two) times daily. )  . nitroGLYCERIN (NITROSTAT) 0.4 MG SL tablet PLACE 1 TABLET (0.4 MG TOTAL)  UNDER THE TONGUE EVERY 5 (FIVE) MINUTES AS NEEDED FOR CHEST PAIN.  Marland Kitchen ondansetron (ZOFRAN) 8 MG tablet Take 8 mg by mouth every 8 (eight) hours as needed for nausea or vomiting.   Marland Kitchen OVER THE COUNTER MEDICATION Place 1 drop into both nostrils daily as needed (nasal congestion).  . potassium chloride (MICRO-K) 10 MEQ CR capsule Take 4 capsules by mouth twice a day (Patient taking differently: Take 20 mEq by mouth 2 (two) times daily. Take 4 capsules by mouth twice a day)  . promethazine (PHENERGAN) 25 MG tablet Take 25 mg by mouth every 6 (six) hours as needed for nausea or vomiting.  . rosuvastatin (CRESTOR) 40 MG tablet Take 1 tablet (40 mg total) by mouth daily.  . fluticasone (FLONASE) 50 MCG/ACT nasal spray USE 2 SPRAYS INTO EACH NOSTRIL EVERY DAY (Patient not taking: Reported on 08/24/2018)    FAMILY HISTORY:   Her family history includes Arthritis in her father and mother; Cancer (age of onset: 107) in her father; Heart disease in an other family member; Hypertension in her maternal aunt and another family member. There is no history of Heart attack or  Stroke.  SOCIAL HISTORY:  She  reports that she has never smoked. She has never used smokeless tobacco. She reports that she does not drink alcohol.  REVIEW OF SYSTEMS:    Gen: Denies fever, chills, weight change, + fatigue, no night sweats HEENT: Denies blurred vision, double vision, hearing loss, tinnitus, sinus congestion, rhinorrhea, sore throat, neck stiffness, dysphagia PULM: Denies shortness of breath, cough, sputum production, hemoptysis, wheezing CV: Denies chest pain, edema, orthopnea, paroxysmal nocturnal dyspnea, palpitations GI: Denies abdominal pain, nausea, vomiting, diarrhea, hematochezia, melena, constipation, change in bowel habits GU: Denies dysuria, hematuria, polyuria, oliguria, urethral discharge Endocrine: Denies hot or cold intolerance, polyuria, polyphagia or appetite change Derm: Denies rash, dry skin, scaling or peeling skin change Heme: Denies easy bruising, bleeding, bleeding gums Neuro: Denies headache, numbness, weakness, slurred speech, loss of memory or consciousness   APP time : 45 minutes  Magdalen Spatz, AGACNP-BC [place] Pager # [phone] After 3 pm [phone] 09/17/2018 2:03 PM  .

## 2018-09-17 NOTE — Progress Notes (Signed)
PROGRESS NOTE    Vanessa Nielsen  SWF:[id number] DOB: Jun 07, 1934 DOA: 08/26/2018 PCP: Velna Hatchet, MD    Brief Narrative: Vanessa Nielsen is a 85 y.o. female with past medical history significant for spina bifida,, hypertension, coronary artery disease status post CABG 2008, last stress test was 2016 and was negative, patient at baseline is able to take a few steps and transition from bed to wheelchair from wheelchair to her car.  Patient presented with progressive weakness, recurrent fall, worsening shortness of breath and hypoxemia.  Assessment & Plan:   Principal Problem:   Respiratory failure with hypoxia ([place]) Active Problems:   Spina bifida of lumbar spine (HCC)   Heart failure, acute diastolic (HCC)   Respiratory failure, acute ([place])   Heart failure ([place])  1-Acute hypoxic respiratory failure: In the setting of acute diastolic heart failure exacerbation. Patient presented with hypoxemia, volume overload, elevated BNP. Continue with 80 mg IV twice daily. Weight down to 198. Negative 5 L.  ECHO with increase pulmonary Hypertension, mitral regurgitation: plan per cardiology.  Patient with worsening hypoxemia. She has been on IV lasix, 5 L negative. Place on BIPAP for hypoxemia. CCM consulted for hypoxic respiratory failure. Case discussed with Dr Cristal Ford, hypoxemia could be multifactorial; HF, ? PNA, hypoventilation syndrome. BIPAP as tolerated.  I spoke with family, plan is to continue with current management, understanding that BIPAP is maximal therapy , patient wouldn't want to be intubated. I have ask Palliative care consult. Meeting will be tomorrow at 9 am, discussed with Dr Domingo Cocking.    2-Acute on Chronic Renal failure stage II; creatinine per records 0.95. Patient presented with a creatinine level 1.9. Suspect acute renal failure related to cardiorenal syndrome. Monitor on IV Lasix. Cr trending down to 1.6 ---1.4  3-Generalized weakness; likely related to  hypoxemia and volume overload. She will need PT eval.  4-Initial Concern for sepsis; was rule out ED physician was concerned with sepsis initially.  Patient is afebrile, no leukocytosis. Follow blood cultures.  No growth to date  5-Hypertension; hold Norvasc, Hyzaar for now. Continue with metoprolol.   6-Elevation troponin;  EKG changes. Patient has history of chest pain on and off.  Per last cardiology note she experience for episode of chest pain during the year. Patient is currently chest pain-free. Continue with metoprolol, aspirin, statins. Cardiology consulted  7-Paroxysmal A. Fib; patient converted to A. fib rate control.  Cardiology will also see in consultation. Started on eliquis.   8-Acute encephalopathy; likely related to acute illness, hypoxemia, heart failure exacerbation.  B12 level low normal. Start B 12 supplement.   Fever, chest x ray with infiltrates, atelectasis.  Started ceftriaxone.   Hypomagnesemia; Order IV magnesium  Hypokalemia; replete  orally.   Morbid obesity; diet education.   Estimated body mass index is 37.57 kg/m as calculated from the following:   Height as of this encounter: 5\' 1"  (1.549 m).   Weight as of this encounter: 90.2 kg.   DVT prophylaxis: Lovenox Code Status: DNR Family Communication: Multiple family member at bedside.  Disposition Plan: remain in the step down unit, on BIPAP/.   Consultants:   Cardiology   Procedures:  Echo   Antimicrobials:   None   Subjective: She is more sleepy this am. Also worsening hypoxemia.   Objective: Vitals:   09/17/18 1200 09/17/18 1300 09/17/18 1400 09/17/18 1500  BP: 111/65 (!) 116/47 113/66 99/64  Pulse: 76 79 (!) 42 70  Resp: (!) 29 (!) 33 17 18  Temp:  98.3 F (36.8 C)     TempSrc: Oral     SpO2: 97% 93% 97% 96%  Weight:      Height:        Intake/Output Summary (Last 24 hours) at 09/17/2018 1601 Last data filed at 09/17/2018 1400 Gross per 24 hour  Intake  653.44 ml  Output 1500 ml  Net -846.56 ml   Filed Weights   09/02/2018 2114 09/15/18 0500 09/17/18 0608  Weight: 91 kg 90.1 kg 90.2 kg    Examination:  General exam: NAD Respiratory system: Bilateral crackles.  Cardiovascular system: S 1, S 2 IRR Gastrointestinal system:  BS present, soft, nt Central nervous system: sleepy.  Extremities: plus 1 edema Skin: No rashes.   Data Reviewed: I have personally reviewed following labs and imaging studies  CBC: Recent Labs  Lab 08/20/2018 1159 09/15/18 0053 09/16/18 0317 09/17/18 0317  WBC 9.2 11.3* 8.5 9.6  NEUTROABS 6.8  --   --   --   HGB 13.0 12.6 13.3 13.4  HCT 43.3 41.4 43.5 45.3  MCV 92.1 91.8 88.8 91.5  PLT 312 283 293 564   Basic Metabolic Panel: Recent Labs  Lab 09/01/2018 1159 09/15/18 0053 09/15/18 1002 09/16/18 0317 09/16/18 1302 09/17/18 0317  NA 141 142  --  141 139 135  K 3.7 3.1*  --  2.7* 2.7* 3.9  CL 103 98  --  88* 86* 89*  CO2 26 31  --  36* 41* 34*  GLUCOSE 109* 81  --  120* 174* 129*  BUN 32* 28*  --  24* 25* 20  CREATININE 1.98* 1.82*  --  1.63* 1.57* 1.42*  CALCIUM 7.9* 8.0*  --  8.0* 7.8* 7.5*  MG  --   --  1.3* 1.5*  --  2.0   GFR: Estimated Creatinine Clearance: 31.2 mL/min (A) (by C-G formula based on SCr of 1.42 mg/dL (H)). Liver Function Tests: Recent Labs  Lab 08/21/2018 1159  AST 47*  ALT 44  ALKPHOS 80  BILITOT 0.8  PROT 5.4*  ALBUMIN 3.1*   No results for input(s): LIPASE, AMYLASE in the last 168 hours. No results for input(s): AMMONIA in the last 168 hours. Coagulation Profile: No results for input(s): INR, PROTIME in the last 168 hours. Cardiac Enzymes: Recent Labs  Lab 09/06/2018 1159 09/03/2018 1853 09/15/18 0053 09/15/18 0654  TROPONINI 0.17* 0.15* 0.14* 0.11*   BNP (last 3 results) No results for input(s): PROBNP in the last 8760 hours. HbA1C: No results for input(s): HGBA1C in the last 72 hours. CBG: Recent Labs  Lab 09/16/18 1219  GLUCAP 183*   Lipid  Profile: No results for input(s): CHOL, HDL, LDLCALC, TRIG, CHOLHDL, LDLDIRECT in the last 72 hours. Thyroid Function Tests: No results for input(s): TSH, T4TOTAL, FREET4, T3FREE, THYROIDAB in the last 72 hours. Anemia Panel: Recent Labs    09/15/18 1002  VITAMINB12 229   Sepsis Labs: Recent Labs  Lab 09/07/2018 1201 08/24/2018 1457  LATICACIDVEN 1.03 1.11    Recent Results (from the past 240 hour(s))  Blood Culture (routine x 2)     Status: None (Preliminary result)   Collection Time: 09/01/2018 12:03 PM  Result Value Ref Range Status   Specimen Description   Final    BLOOD BLOOD RIGHT FOREARM Performed at [facility] [address] [place], [place] [address]    Special Requests   Final    BOTTLES DRAWN AEROBIC AND ANAEROBIC Blood Culture adequate volume Performed at [facility], [place] [address] [place], [place] [address]    Culture   Final    NO GROWTH 3 DAYS Performed at [facility], [place] [address] [place], [place] [address]    Report Status PENDING  Incomplete  Blood Culture (routine x 2)     Status: None (Preliminary result)   Collection Time: 09/08/2018 12:03 PM  Result Value Ref Range Status   Specimen Description   Final    BLOOD LEFT ANTECUBITAL Performed at [facility] [address], [place], [place] [address]    Special Requests   Final    BOTTLES DRAWN AEROBIC AND ANAEROBIC Blood Culture results may not be optimal due to an excessive volume of blood received in culture bottles Performed at [facility] [address], [place], [place] [address]    Culture   Final    NO GROWTH 3 DAYS Performed at [facility], [place] [address] [place], [place] [address]    Report Status PENDING  Incomplete  MRSA PCR Screening     Status: None   Collection Time: 08/19/2018  9:34 PM  Result Value Ref Range Status   MRSA by PCR NEGATIVE NEGATIVE Final    Comment:        The GeneXpert  MRSA Assay (FDA approved for NASAL specimens only), is one component of a comprehensive MRSA colonization surveillance program. It is not intended to diagnose MRSA infection nor to guide or monitor treatment for MRSA infections. Performed at [facility], [place] [address] [place], [place] [address]          Radiology Studies: Dg Chest Port 1 View  Result Date: 09/17/2018 CLINICAL DATA:  Fever. EXAM: PORTABLE CHEST 1 VIEW COMPARISON:  Radiograph September 14, 2018. FINDINGS: Stable cardiomegaly is noted with central pulmonary vascular congestion. Atherosclerosis of thoracic aorta is noted. Hypoinflation of the lungs is again noted with bibasilar subsegmental atelectasis or possibly infiltrates. Bony thorax is unremarkable. IMPRESSION: Hypoinflation of the lungs is noted with bibasilar subsegmental atelectasis or possibly infiltrates. Aortic Atherosclerosis (ICD10-I70.0). Electronically Signed   By: Marijo Conception, M.D.   On: 09/17/2018 07:59        Scheduled Meds: . apixaban  2.5 mg Oral BID  . furosemide  80 mg Intravenous Q12H  . magnesium oxide  400 mg Oral BID  . mouth rinse  15 mL Mouth Rinse BID  . metoprolol tartrate  50 mg Oral BID  . polyethylene glycol  17 g Oral BID  . rosuvastatin  40 mg Oral Daily  . sodium chloride flush  3 mL Intravenous Q12H  . vitamin B-12  100 mcg Oral Daily   Continuous Infusions: . sodium chloride Stopped (09/17/18 1155)  . cefTRIAXone (ROCEPHIN)  IV Stopped (09/17/18 1113)     LOS: 2 days    Time spent: 35 minutes.     Elmarie Shiley, MD [facility] Pager [phone]  If 7PM-7AM, please contact night-coverage [email or web address] Password [email or web address] 09/17/2018, 4:01 PM

## 2018-09-17 NOTE — Progress Notes (Signed)
Pt currently on 12 LPM [place] and tolerating well at this time.  Pt in no noted distress, BIPAP not indicated at this time.  RT to monitor and assess as needed.

## 2018-09-18 ENCOUNTER — Inpatient Hospital Stay (HOSPITAL_COMMUNITY): Payer: Medicare Other

## 2018-09-18 DIAGNOSIS — Z515 Encounter for palliative care: Secondary | ICD-10-CM

## 2018-09-18 DIAGNOSIS — Z7189 Other specified counseling: Secondary | ICD-10-CM

## 2018-09-18 DIAGNOSIS — I2729 Other secondary pulmonary hypertension: Secondary | ICD-10-CM

## 2018-09-18 LAB — BASIC METABOLIC PANEL
Anion gap: 13 (ref 5–15)
BUN: 23 mg/dL (ref 8–23)
CO2: 37 mmol/L — ABNORMAL HIGH (ref 22–32)
Calcium: 7.7 mg/dL — ABNORMAL LOW (ref 8.9–10.3)
Chloride: 89 mmol/L — ABNORMAL LOW (ref 98–111)
Creatinine, Ser: 1.5 mg/dL — ABNORMAL HIGH (ref 0.44–1.00)
GFR calc Af Amer: 37 mL/min — ABNORMAL LOW (ref >60)
GFR calc non Af Amer: 32 mL/min — ABNORMAL LOW (ref >60)
Glucose, Bld: 137 mg/dL — ABNORMAL HIGH (ref 70–99)
Potassium: 3.6 mmol/L (ref 3.5–5.1)
Sodium: 139 mmol/L (ref 135–145)

## 2018-09-18 MED ORDER — GLYCOPYRROLATE 0.2 MG/ML IJ SOLN: 0.2000 mg | INTRAMUSCULAR | Status: DC | PRN

## 2018-09-18 MED ORDER — HALOPERIDOL LACTATE 2 MG/ML PO CONC
0.5000 mg | ORAL | Status: DC | PRN
  Filled 2018-09-18: qty 0.3

## 2018-09-18 MED ORDER — DIPHENHYDRAMINE HCL 50 MG/ML IJ SOLN: 12.5000 mg | Freq: Four times a day (QID) | INTRAMUSCULAR | Status: DC | PRN

## 2018-09-18 MED ORDER — SODIUM CHLORIDE 0.9% FLUSH
3.0000 mL | INTRAVENOUS | Status: DC | PRN
  Administered 2018-09-19: 3 mL via INTRAVENOUS
  Filled 2018-09-18: qty 3

## 2018-09-18 MED ORDER — HALOPERIDOL 1 MG PO TABS: 0.5000 mg | ORAL_TABLET | ORAL | Status: DC | PRN

## 2018-09-18 MED ORDER — FUROSEMIDE 10 MG/ML IJ SOLN
40.0000 mg | Freq: Every day | INTRAMUSCULAR | Status: DC
  Administered 2018-09-18 – 2018-09-19 (×2): 40 mg via INTRAVENOUS
  Filled 2018-09-18 (×2): qty 4

## 2018-09-18 MED ORDER — FUROSEMIDE 40 MG PO TABS: 80.0000 mg | ORAL_TABLET | Freq: Every day | ORAL | Status: DC

## 2018-09-18 MED ORDER — HYDROMORPHONE BOLUS VIA INFUSION
0.5000 mg | INTRAVENOUS | Status: DC | PRN
  Administered 2018-09-18 – 2018-09-19 (×5): 0.5 mg via INTRAVENOUS
  Filled 2018-09-18: qty 1

## 2018-09-18 MED ORDER — SODIUM CHLORIDE 0.9 % IV SOLN: 250.0000 mL | INTRAVENOUS | Status: DC | PRN

## 2018-09-18 MED ORDER — SODIUM CHLORIDE 0.9% FLUSH
3.0000 mL | Freq: Two times a day (BID) | INTRAVENOUS | Status: DC
  Administered 2018-09-18: 3 mL via INTRAVENOUS

## 2018-09-18 MED ORDER — GLYCOPYRROLATE 1 MG PO TABS: 1.0000 mg | ORAL_TABLET | ORAL | Status: DC | PRN

## 2018-09-18 MED ORDER — LORAZEPAM 2 MG/ML IJ SOLN: 0.5000 mg | INTRAMUSCULAR | Status: DC | PRN

## 2018-09-18 MED ORDER — BIOTENE DRY MOUTH MT LIQD: 15.0000 mL | OROMUCOSAL | Status: DC | PRN

## 2018-09-18 MED ORDER — HALOPERIDOL LACTATE 5 MG/ML IJ SOLN: 0.5000 mg | INTRAMUSCULAR | Status: DC | PRN

## 2018-09-18 MED ORDER — POLYVINYL ALCOHOL 1.4 % OP SOLN
1.0000 [drp] | Freq: Four times a day (QID) | OPHTHALMIC | Status: DC | PRN
  Filled 2018-09-18: qty 15

## 2018-09-18 MED ORDER — MINERAL OIL RE ENEM
1.0000 | ENEMA | Freq: Every day | RECTAL | Status: DC | PRN
  Filled 2018-09-18: qty 1

## 2018-09-18 MED ORDER — SODIUM CHLORIDE 0.9 % IV SOLN
1.5000 mg/h | INTRAVENOUS | Status: DC
  Administered 2018-09-18: 0.2 mg/h via INTRAVENOUS
  Filled 2018-09-18 (×2): qty 5

## 2018-09-18 NOTE — Progress Notes (Signed)
PROGRESS NOTE    Vanessa Nielsen  AJO:[id number] DOB: [birth year] DOA: 08/19/2018 PCP: Velna Hatchet, MD    Brief Narrative: Vanessa Nielsen is a 85 y.o. female with past medical history significant for spina bifida,, hypertension, coronary artery disease status post CABG 2008, last stress test was 2016 and was negative, patient at baseline is able to take a few steps and transition from bed to wheelchair from wheelchair to her car.  Patient presented with progressive weakness, recurrent fall, worsening shortness of breath and hypoxemia.  Assessment & Plan:   Principal Problem:   Respiratory failure with hypoxia ([place]) Active Problems:   Spina bifida of lumbar spine (HCC)   Heart failure, acute diastolic (HCC)   Respiratory failure, acute ([place])   Heart failure ([place])  1-Acute hypoxic respiratory failure: In the setting of acute diastolic heart failure exacerbation. Patient presented with hypoxemia, volume overload, elevated BNP. Continue with 80 mg IV twice daily. Weight down to 198. Negative 5 L.  ECHO with increase pulmonary Hypertension, mitral regurgitation: plan per cardiology.  Patient with worsening hypoxemia. She has been on IV lasix, 5 L negative. Place on BIPAP for hypoxemia. CCM consulted for hypoxic respiratory failure. Case discussed with Dr Cristal Ford, hypoxemia could be multifactorial; HF, ? PNA, hypoventilation syndrome.  I spoke with family, plan is to continue with current management, understanding that BIPAP is maximal therapy , patient wouldn't want to be intubated. I have ask Palliative care consult. Meeting will be tomorrow at 9 am, discussed with Dr Domingo Cocking.  Family met with palliative plan is to proceed with comfort measure.   2-Acute on Chronic Renal failure stage II; creatinine per records 0.95. Patient presented with a creatinine level 1.9. Suspect acute renal failure related to cardiorenal syndrome. Monitor on IV Lasix. Cr trending down to 1.6  ---1.4--1.5  3-Generalized weakness; likely related to hypoxemia and volume overload. She has been declining for last months.   4-Initial Concern for sepsis; was rule out ED physician was concerned with sepsis initially.  Patient is afebrile, no leukocytosis. Follow blood cultures.  No growth to date  5-Hypertension; hold Norvasc, Hyzaar for now. Continue with metoprolol.   6-Elevation troponin;  EKG changes. Patient has history of chest pain on and off.  Per last cardiology note she experience for episode of chest pain during the year. Patient is currently chest pain-free. Continue with metoprolol, aspirin, statins. Cardiology consulted  7-Paroxysmal A. Fib; patient converted to A. fib rate control.  Cardiology will also see in consultation. Started on eliquis.   8-Acute encephalopathy; likely related to acute illness, hypoxemia, heart failure exacerbation.  B12 level low normal. Start B 12 supplement.   Fever, chest x ray with infiltrates, atelectasis.  Started ceftriaxone.   Hypomagnesemia; Order IV magnesium  Hypokalemia; replete  orally.   Morbid obesity; diet education.   Estimated body mass index is 35.24 kg/m as calculated from the following:   Height as of this encounter: '5\' 1"'  (1.549 m).   Weight as of this encounter: 84.6 kg.   DVT prophylaxis: Lovenox Code Status: DNR Family Communication: Multiple family member at bedside.  Disposition Plan: plan is for comfort care.   Consultants:   Cardiology   Procedures:  Echo   Antimicrobials:   None   Subjective: Sleepy on BIPAP , wants BIPAP off.   Objective: Vitals:   09/18/18 0800 09/18/18 1000 09/18/18 1100 09/18/18 1200  BP: 106/61 120/66 (!) 120/58 104/64  Pulse: 84 95 90 84  Resp: (!) 22 (!)  30 (!) 38 (!) 33  Temp: 98.8 F (37.1 C)   99.4 F (37.4 C)  TempSrc: Axillary   Oral  SpO2: 99% 95% 92% 91%  Weight:      Height:        Intake/Output Summary (Last 24 hours) at 09/18/2018  1710 Last data filed at 09/18/2018 1417 Gross per 24 hour  Intake 1.1 ml  Output 1450 ml  Net -1448.9 ml   Filed Weights   09/15/18 0500 09/17/18 0608 09/18/18 0500  Weight: 90.1 kg 90.2 kg 84.6 kg    Examination:  General exam: NAD on BIPAP Respiratory system: decrease breath sounds.  Cardiovascular system:  S 1, S 2 IRR Gastrointestinal system:  BS present, soft, nt Central nervous system: sleepy  Extremities: plus 1 edema  Data Reviewed: I have personally reviewed following labs and imaging studies  CBC: Recent Labs  Lab 08/26/2018 1159 09/15/18 0053 09/16/18 0317 09/17/18 0317  WBC 9.2 11.3* 8.5 9.6  NEUTROABS 6.8  --   --   --   HGB 13.0 12.6 13.3 13.4  HCT 43.3 41.4 43.5 45.3  MCV 92.1 91.8 88.8 91.5  PLT 312 283 293 423   Basic Metabolic Panel: Recent Labs  Lab 09/15/18 0053 09/15/18 1002 09/16/18 0317 09/16/18 1302 09/17/18 0317 09/18/18 0313  NA 142  --  141 139 135 139  K 3.1*  --  2.7* 2.7* 3.9 3.6  CL 98  --  88* 86* 89* 89*  CO2 31  --  36* 41* 34* 37*  GLUCOSE 81  --  120* 174* 129* 137*  BUN 28*  --  24* 25* 20 23  CREATININE 1.82*  --  1.63* 1.57* 1.42* 1.50*  CALCIUM 8.0*  --  8.0* 7.8* 7.5* 7.7*  MG  --  1.3* 1.5*  --  2.0  --    GFR: Estimated Creatinine Clearance: 28.5 mL/min (A) (by C-G formula based on SCr of 1.5 mg/dL (H)). Liver Function Tests: Recent Labs  Lab 08/25/2018 1159  AST 47*  ALT 44  ALKPHOS 80  BILITOT 0.8  PROT 5.4*  ALBUMIN 3.1*   No results for input(s): LIPASE, AMYLASE in the last 168 hours. No results for input(s): AMMONIA in the last 168 hours. Coagulation Profile: No results for input(s): INR, PROTIME in the last 168 hours. Cardiac Enzymes: Recent Labs  Lab 09/13/2018 1159 09/13/2018 1853 09/15/18 0053 09/15/18 0654  TROPONINI 0.17* 0.15* 0.14* 0.11*   BNP (last 3 results) No results for input(s): PROBNP in the last 8760 hours. HbA1C: No results for input(s): HGBA1C in the last 72  hours. CBG: Recent Labs  Lab 09/16/18 1219  GLUCAP 183*   Lipid Profile: No results for input(s): CHOL, HDL, LDLCALC, TRIG, CHOLHDL, LDLDIRECT in the last 72 hours. Thyroid Function Tests: No results for input(s): TSH, T4TOTAL, FREET4, T3FREE, THYROIDAB in the last 72 hours. Anemia Panel: No results for input(s): VITAMINB12, FOLATE, FERRITIN, TIBC, IRON, RETICCTPCT in the last 72 hours. Sepsis Labs: Recent Labs  Lab 09/08/2018 1201 08/28/2018 1457  LATICACIDVEN 1.03 1.11    Recent Results (from the past 240 hour(s))  Blood Culture (routine x 2)     Status: None (Preliminary result)   Collection Time: 09/04/2018 12:03 PM  Result Value Ref Range Status   Specimen Description BLOOD BLOOD RIGHT FOREARM  Final   Special Requests   Final    BOTTLES DRAWN AEROBIC AND ANAEROBIC Blood Culture adequate volume Performed at [address] [place], [place] [address]    Culture NO GROWTH 4 DAYS  Final   Report Status PENDING  Incomplete  Blood Culture (routine x 2)     Status: None (Preliminary result)   Collection Time: 08/25/2018 12:03 PM  Result Value Ref Range Status   Specimen Description BLOOD LEFT ANTECUBITAL  Final   Special Requests   Final    BOTTLES DRAWN AEROBIC AND ANAEROBIC Blood Culture results may not be optimal due to an excessive volume of blood received in culture bottles Performed at [facility], [place] [address], [place], [place] [address]    Culture NO GROWTH 4 DAYS  Final   Report Status PENDING  Incomplete  MRSA PCR Screening     Status: None   Collection Time: 09/07/2018  9:34 PM  Result Value Ref Range Status   MRSA by PCR NEGATIVE NEGATIVE Final    Comment:        The GeneXpert MRSA Assay (FDA approved for NASAL specimens only), is one component of a comprehensive MRSA colonization surveillance program. It is not intended to diagnose MRSA infection nor to guide or monitor treatment for MRSA  infections. Performed at [facility], [place] [address], [place], [place] [address]          Radiology Studies: Dg Chest Port 1 View  Result Date: 09/18/2018 CLINICAL DATA:  Initial evaluation for acute respiratory failure. EXAM: PORTABLE CHEST 1 VIEW COMPARISON:  Prior radiograph from 09/17/2018. FINDINGS: Cardiomegaly, grossly stable from previous. Mediastinal silhouette within normal limits. Extensive aortic atherosclerosis. Lungs are hypoinflated. Interval improvement in pulmonary interstitial congestion/edema as compared to previous exam. Patchy bibasilar opacities persist but are also improved, which could reflect atelectasis or possibly infiltrates. No appreciable pleural effusion. No pneumothorax. Osseous structures are unchanged. IMPRESSION: 1. Persistent but improved bibasilar opacities, which could reflect atelectasis or possibly infiltrates. 2. Stable cardiomegaly with interval improvement in underlying pulmonary interstitial congestion/edema. Electronically Signed   By: Jeannine Boga M.D.   On: 09/18/2018 06:06   Dg Chest Port 1 View  Result Date: 09/17/2018 CLINICAL DATA:  Fever. EXAM: PORTABLE CHEST 1 VIEW COMPARISON:  Radiograph September 14, 2018. FINDINGS: Stable cardiomegaly is noted with central pulmonary vascular congestion. Atherosclerosis of thoracic aorta is noted. Hypoinflation of the lungs is again noted with bibasilar subsegmental atelectasis or possibly infiltrates. Bony thorax is unremarkable. IMPRESSION: Hypoinflation of the lungs is noted with bibasilar subsegmental atelectasis or possibly infiltrates. Aortic Atherosclerosis (ICD10-I70.0). Electronically Signed   By: Marijo Conception, M.D.   On: 09/17/2018 07:59        Scheduled Meds: . furosemide  40 mg Intravenous Daily  . mouth rinse  15 mL Mouth Rinse BID  . sodium chloride flush  3 mL Intravenous Q12H  . sodium chloride flush  3 mL Intravenous Q12H   Continuous Infusions: . sodium  chloride Stopped (09/17/18 1155)  . sodium chloride    . HYDROmorphone 0.2 mg/hr (09/18/18 1125)     LOS: 3 days    Time spent: 35 minutes.     Elmarie Shiley, MD [facility] Pager [phone]  If 7PM-7AM, please contact night-coverage [email or web address] Password [email or web address] 09/18/2018, 5:10 PM

## 2018-09-18 NOTE — Consult Note (Signed)
Consultation Note Date: 09/18/2018   Patient Name: Vanessa Nielsen  DOB: [birth year]  MRN: [id number]  Age / Sex: 85 y.o., female  PCP: Velna Hatchet, MD Referring Physician: Elmarie Shiley, MD  Reason for Consultation: Establishing goals of care, Non pain symptom management and Pain control  HPI/Patient Profile: 85 y.o. female  with past medical history of spina bifida, hypertension, coronary disease status post CABG, admitted on 08/29/2018 with acute hypoxic respiratory failure with CHF exacerbation.  Since that time, her respiratory status is continued to decline despite diuresis.  It is felt that respiratory decline is due to underlying nontreated sleep apnea, obesity hypoventilation syndrome, and chronic deconditioning.  Palliative consulted for goals of care  Clinical Assessment and Goals of Care: I met today with patient's family including her husband, son, daughter, daughter-in-law, and son-in-law.   They report the most important thing for the patient has always been her family.  She has continued to have increasing difficulty with progression of her dementia and is often irritable and does not understand her situation.  Over the past couple of days, family reports that she is only been asking to return to her own home.  They also report whenever she is home, however, she is not happy there and is often agitated.  We discussed clinical course as well as wishes moving forward in regard to advanced directives.  Concepts specific to code status and care plan this hospitalization discussed.  We discussed difference between a aggressive medical intervention path and a palliative, comfort focused care path.  Values and goals of care important to patient and family were attempted to be elicited.  Family reports that they think that she would want to be home, however, they are concerned about ability to  adequately care for her, particularly if it is going to be a very short period of time.  We discussed that with her reliance on BiPAP and high flow oxygen, her prognosis with a focus on comfort and working to wean some of these interventions would be hours to days most likely.  Sats dropping off Bipap on HFNC (15L) as we spoke in the room.  Improved on NRB.  After deliberation and discussion of challenges on transitioning home with hospice, family would like to shift to comfort with likely hospital death.  Concept of Hospice and Palliative Care were discussed  Questions and concerns addressed.   PMT will continue to support holistically.  SUMMARY OF RECOMMENDATIONS   -DNR/DNI -Overall goal moving forward is comfort and dignity as she approaches end-of-life.  On my examination today she does not appear to be comfortable and we will plan for initiation of a low-dose Dilaudid infusion.  Her family understands her situation and do not want to unnecessarily prolong the process of her dying, however, they would like to give the opportunity for her grandchildren to come visit if they desire to do so.  Family reports that they had a very good Christmas with her, and her grandchildren may be at peace with that memory  and not coming to visit further but want to allow the opportunity if desired. -We discussed a plan to initiate medications for comfort, discontinue interventions that are nonspecifically related to her comfort, and place limits on escalation with no plan for further Bipap.  They understand that she may die and are at peace with this.  Will plan to reassess tomorrow and possibly further decrease aggressiveness of oxygen support (d/c non-rebreather, decrease [place] rate) after opportunity for her grandchildren to visit if they desire.  - Overall underlying goals is comfort.  Plan to reassess tomorrow.  Code Status/Advance Care Planning:  DNR   Symptom Management:   Pain/SOB: Appears  uncomfortable throughout encounter.  Plan for initiation of low dose dilaudid infusion with prn bolus.  Anxiety: Regularly takeds benzos (Valium) at home, particularly at night.  Ativan as needed.  Agitation: haldol as needed  Secretions: Robinul as needed   Palliative Prophylaxis:   Aspiration, Delirium Protocol and Frequent Pain Assessment  Additional Recommendations (Limitations, Scope, Preferences):  Full Comfort Care  Psycho-social/Spiritual:   Desire for further Chaplaincy support:Did not address  Additional Recommendations: Education on Hospice  Prognosis:   Hours - Days  Discharge Planning: Anticipated Hospital Death      Primary Diagnoses: Present on Admission: . Respiratory failure, acute ([place])   I have reviewed the medical record, interviewed the patient and family, and examined the patient. The following aspects are pertinent.  Past Medical History:  Diagnosis Date  . Allergic rhinitis   . Coronary artery disease    2v CABG @ [facility]  . Hypercholesteremia   . Hyperlipidemia   . Hypertension   . Iatrogenic Cushing's disease ([place])   . Multiple allergies    induced by pain: swelling in UE/face/torso, nausea, hives followed by hypotension unless tx with high dose steroids  . Osteoarthritis   . Scoliosis   . Spina bifida    Social History   Socioeconomic History  . Marital status: Married    Spouse name: Not on file  . Number of children: Not on file  . Years of education: Not on file  . Highest education level: Not on file  Occupational History  . Occupation: retired    Fish farm manager: RETIRED  Social Needs  . Financial resource strain: Not on file  . Food insecurity:    Worry: Not on file    Inability: Not on file  . Transportation needs:    Medical: Not on file    Non-medical: Not on file  Tobacco Use  . Smoking status: Never Smoker  . Smokeless tobacco: Never Used  . Tobacco comment: married, lives with spouse. retired Development worker, international aid  - supportive adult children near [place]  Substance and Sexual Activity  . Alcohol use: No  . Drug use: Not on file  . Sexual activity: Not on file  Lifestyle  . Physical activity:    Days per week: Not on file    Minutes per session: Not on file  . Stress: Not on file  Relationships  . Social connections:    Talks on phone: Not on file    Gets together: Not on file    Attends religious service: Not on file    Active member of club or organization: Not on file    Attends meetings of clubs or organizations: Not on file    Relationship status: Not on file  Other Topics Concern  . Not on file  Social History Narrative  . Not on file   Family History  Problem Relation Age of Onset  . Arthritis Mother   . Cancer Father 72       colon  . Arthritis Father   . Heart disease Other   . Hypertension Other   . Hypertension Maternal Aunt   . Heart attack Neg Hx   . Stroke Neg Hx    Scheduled Meds: . furosemide  40 mg Intravenous Daily  . mouth rinse  15 mL Mouth Rinse BID  . sodium chloride flush  3 mL Intravenous Q12H  . sodium chloride flush  3 mL Intravenous Q12H   Continuous Infusions: . sodium chloride Stopped (09/17/18 1155)  . sodium chloride    . HYDROmorphone 0.2 mg/hr (09/18/18 1050)   PRN Meds:.sodium chloride, sodium chloride, acetaminophen **OR** acetaminophen, antiseptic oral rinse, glycopyrrolate **OR** glycopyrrolate **OR** glycopyrrolate, haloperidol **OR** haloperidol **OR** haloperidol lactate, HYDROmorphone, LORazepam, nitroGLYCERIN, ondansetron **OR** ondansetron (ZOFRAN) IV, polyvinyl alcohol, sodium chloride flush, sodium chloride flush Medications Prior to Admission:  Prior to Admission medications   Medication Sig Start Date End Date Taking? Authorizing Provider  amLODipine (NORVASC) 5 MG tablet TAKE 1 TABLET BY MOUTH IF SYSTOLIC BLOOD PRESSURE IS ABOVE 150 Patient taking differently: Take 5 mg by mouth daily.  09/03/18  Yes Skeet Latch, MD  aspirin  81 MG EC tablet Take 81 mg by mouth daily.     Yes [provider]  Dextrose-Fructose-Sod Citrate (NAUZENE PO) Take 1 tablet by mouth daily as needed (nausea).   Yes [provider]  diazepam (VALIUM) 5 MG tablet Take 5 mg by mouth at bedtime as needed (sleep).   Yes [provider]  ibuprofen (ADVIL,MOTRIN) 200 MG tablet Take 600 mg by mouth 2 (two) times daily as needed for moderate pain.   Yes [provider]  losartan-hydrochlorothiazide (HYZAAR) 100-25 MG tablet TAKE 1 TABLET BY MOUTH EVERY DAY 05/06/18  Yes Skeet Latch, MD  lubiprostone (AMITIZA) 8 MCG capsule Take 8 mcg by mouth daily as needed for constipation.   Yes [provider]  metoprolol tartrate (LOPRESSOR) 50 MG tablet TAKE 1/2 TABLET BY MOUTH 2 TIMES DAILY. Patient taking differently: Take 25 mg by mouth 2 (two) times daily.  08/20/18  Yes Skeet Latch, MD  nitroGLYCERIN (NITROSTAT) 0.4 MG SL tablet PLACE 1 TABLET (0.4 MG TOTAL) UNDER THE TONGUE EVERY 5 (FIVE) MINUTES AS NEEDED FOR CHEST PAIN. 10/05/17  Yes Skeet Latch, MD  ondansetron (ZOFRAN) 8 MG tablet Take 8 mg by mouth every 8 (eight) hours as needed for nausea or vomiting.    Yes [provider]  OVER THE COUNTER MEDICATION Place 1 drop into both nostrils daily as needed (nasal congestion).   Yes [provider]  potassium chloride (MICRO-K) 10 MEQ CR capsule Take 4 capsules by mouth twice a day Patient taking differently: Take 20 mEq by mouth 2 (two) times daily. Take 4 capsules by mouth twice a day 04/08/18  Yes Skeet Latch, MD  promethazine (PHENERGAN) 25 MG tablet Take 25 mg by mouth every 6 (six) hours as needed for nausea or vomiting.   Yes [provider]  rosuvastatin (CRESTOR) 40 MG tablet Take 1 tablet (40 mg total) by mouth daily. 04/01/18 08/19/2018 Yes Skeet Latch, MD  fluticasone [facility]) 50 MCG/ACT nasal spray USE 2 SPRAYS INTO EACH NOSTRIL EVERY DAY Patient not  taking: Reported on 09/03/2018 09/19/15   Darlin Coco, MD   Allergies  Allergen Reactions  . Cardura [Doxazosin Mesylate]     Doesn't recall  . Clarithromycin  Doesn't recall  . Erythromycin     Doesn't recall  . Guaifenesin     Doesn't recall  . Ketorolac Tromethamine     Doesn't recall   Review of Systems Unable to assess  Physical Exam General: Sleepy but aroused briefly.  Facial grimace noted. Heart: Regular rate and rhythm. No murmur appreciated. Lungs: Decreased air movement, + rhonchi Abdomen: Soft, nontender, nondistended, positive bowel sounds.  Ext: + edema Skin: Warm and dry  Vital Signs: BP (!) 120/58   Pulse 90   Temp 98.2 F (36.8 C) (Axillary)   Resp (!) 38   Ht '5\' 1"'  (1.549 m)   Wt 84.6 kg   SpO2 92%   BMI 35.24 kg/m  Pain Scale: Faces   Pain Score: 0-No pain   SpO2: SpO2: 92 % O2 Device:SpO2: 92 % O2 Flow Rate: .O2 Flow Rate (L/min): 15 L/min  IO: Intake/output summary:   Intake/Output Summary (Last 24 hours) at 09/18/2018 1123 Last data filed at 09/17/2018 1955 Gross per 24 hour  Intake 52.74 ml  Output 650 ml  Net -597.26 ml    LBM: Last BM Date: 08/30/2018 Baseline Weight: Weight: 91 kg Most recent weight: Weight: 84.6 kg     Palliative Assessment/Data:   Flowsheet Rows     Most Recent Value  Intake Tab  Referral Department  Hospitalist  Unit at Time of Referral  ICU  Palliative Care Primary Diagnosis  Pulmonary  Date Notified  09/17/18  Palliative Care Type  New Palliative care  Reason for referral  Clarify Goals of Care  Date of Admission  09/15/18  Date first seen by Palliative Care  09/18/18  # of days Palliative referral response time  1 Day(s)  # of days IP prior to Palliative referral  2  Clinical Assessment  Palliative Performance Scale Score  20%  Pain Max last 24 hours  Not able to report  Pain Min Last 24 hours  Not able to report  Dyspnea Max Last 24 Hours  Not able to report  Dyspnea Min Last 24 hours   Not able to report  Psychosocial & Spiritual Assessment  Palliative Care Outcomes  Patient/Family meeting held?  Yes  Who was at the meeting?  Husband, Son, Daughter, SIL, DIL  Palliative Care Outcomes  Improved pain interventions, Improved non-pain symptom therapy, Clarified goals of care, Changed to focus on comfort      Time In: 0920 Time Out: 1040 Time Total: 80 Greater than 50%  of this time was spent counseling and coordinating care related to the above assessment and plan.  Signed by: Micheline Rough, MD   Please contact Palliative Medicine Team phone at [phone] for questions and concerns.  For individual provider: See Shea Evans

## 2018-09-18 NOTE — Progress Notes (Signed)
Progress Note  Patient Name: Vanessa Nielsen Date of Encounter: 09/18/2018  Primary Cardiologist: Skeet Latch, MD   Subjective   Seen while on BiPAP this AM. Family present. Reviewed echo results, heart involvement in her symptoms.  Inpatient Medications    Scheduled Meds: . apixaban  2.5 mg Oral BID  . furosemide  80 mg Intravenous Q12H  . magnesium oxide  400 mg Oral BID  . mouth rinse  15 mL Mouth Rinse BID  . metoprolol tartrate  50 mg Oral BID  . polyethylene glycol  17 g Oral BID  . rosuvastatin  40 mg Oral Daily  . sodium chloride flush  3 mL Intravenous Q12H  . vitamin B-12  100 mcg Oral Daily   Continuous Infusions: . sodium chloride Stopped (09/17/18 1155)  . cefTRIAXone (ROCEPHIN)  IV Stopped (09/17/18 1113)   PRN Meds: sodium chloride, acetaminophen **OR** acetaminophen, diazepam, lubiprostone, mineral oil, nitroGLYCERIN, ondansetron **OR** ondansetron (ZOFRAN) IV, sodium chloride flush   Vital Signs    Vitals:   09/18/18 0016 09/18/18 0320 09/18/18 0346 09/18/18 0500  BP: 110/69 93/66    Pulse: 63 91    Resp: 18 16    Temp:   98.2 F (36.8 C)   TempSrc:   Axillary   SpO2: 99% 98%    Weight:    84.6 kg  Height:        Intake/Output Summary (Last 24 hours) at 09/18/2018 0929 Last data filed at 09/17/2018 1955 Gross per 24 hour  Intake 158.38 ml  Output 1300 ml  Net -1141.62 ml   Filed Weights   09/15/18 0500 09/17/18 0608 09/18/18 0500  Weight: 90.1 kg 90.2 kg 84.6 kg    Telemetry    SR, rare PVCs - Personally Reviewed  ECG    No new since 1/1- Personally Reviewed  Physical Exam   GEN: resting in bed, Bipap in place  Neck: JVD not well visualized due to bipap mask and positioning in bed Cardiac: regular S1 and S2, no murmurs, rubs, or gallops.  Respiratory: + rhonchi to auscultation bilaterally. GI: Soft, nontender, non-distended  MS: Pedal edema; No deformity. Neuro:  Nonfocal  Psych: Normal affect   Labs     Chemistry Recent Labs  Lab 09/13/2018 1159  09/16/18 1302 09/17/18 0317 09/18/18 0313  NA 141   < > 139 135 139  K 3.7   < > 2.7* 3.9 3.6  CL 103   < > 86* 89* 89*  CO2 26   < > 41* 34* 37*  GLUCOSE 109*   < > 174* 129* 137*  BUN 32*   < > 25* 20 23  CREATININE 1.98*   < > 1.57* 1.42* 1.50*  CALCIUM 7.9*   < > 7.8* 7.5* 7.7*  PROT 5.4*  --   --   --   --   ALBUMIN 3.1*  --   --   --   --   AST 47*  --   --   --   --   ALT 44  --   --   --   --   ALKPHOS 80  --   --   --   --   BILITOT 0.8  --   --   --   --   GFRNONAA 23*   < > 30* 34* 32*  GFRAA 27*   < > 35* 40* 37*  ANIONGAP 12   < > 12 12 13    < > =  values in this interval not displayed.     Hematology Recent Labs  Lab 09/15/18 0053 09/16/18 0317 09/17/18 0317  WBC 11.3* 8.5 9.6  RBC 4.51 4.90 4.95  HGB 12.6 13.3 13.4  HCT 41.4 43.5 45.3  MCV 91.8 88.8 91.5  MCH 27.9 27.1 27.1  MCHC 30.4 30.6 29.6*  RDW 15.8* 15.7* 16.1*  PLT 283 293 293    Cardiac Enzymes Recent Labs  Lab 08/26/2018 1159 08/29/2018 1853 09/15/18 0053 09/15/18 0654  TROPONINI 0.17* 0.15* 0.14* 0.11*   No results for input(s): TROPIPOC in the last 168 hours.   BNP Recent Labs  Lab 09/06/2018 1159  BNP 1,489.0*     DDimer No results for input(s): DDIMER in the last 168 hours.   Radiology    Dg Chest Port 1 View  Result Date: 09/18/2018 CLINICAL DATA:  Initial evaluation for acute respiratory failure. EXAM: PORTABLE CHEST 1 VIEW COMPARISON:  Prior radiograph from 09/17/2018. FINDINGS: Cardiomegaly, grossly stable from previous. Mediastinal silhouette within normal limits. Extensive aortic atherosclerosis. Lungs are hypoinflated. Interval improvement in pulmonary interstitial congestion/edema as compared to previous exam. Patchy bibasilar opacities persist but are also improved, which could reflect atelectasis or possibly infiltrates. No appreciable pleural effusion. No pneumothorax. Osseous structures are unchanged. IMPRESSION: 1.  Persistent but improved bibasilar opacities, which could reflect atelectasis or possibly infiltrates. 2. Stable cardiomegaly with interval improvement in underlying pulmonary interstitial congestion/edema. Electronically Signed   By: Jeannine Boga M.D.   On: 09/18/2018 06:06   Dg Chest Port 1 View  Result Date: 09/17/2018 CLINICAL DATA:  Fever. EXAM: PORTABLE CHEST 1 VIEW COMPARISON:  Radiograph September 14, 2018. FINDINGS: Stable cardiomegaly is noted with central pulmonary vascular congestion. Atherosclerosis of thoracic aorta is noted. Hypoinflation of the lungs is again noted with bibasilar subsegmental atelectasis or possibly infiltrates. Bony thorax is unremarkable. IMPRESSION: Hypoinflation of the lungs is noted with bibasilar subsegmental atelectasis or possibly infiltrates. Aortic Atherosclerosis (ICD10-I70.0). Electronically Signed   By: Marijo Conception, M.D.   On: 09/17/2018 07:59    Cardiac Studies    Echo 09/15/2018 Study Conclusions  - Left ventricle: The cavity size was normal. There was mild focal basal hypertrophy of the septum. Systolic function was normal. The estimated ejection fraction was in the range of 60% to 65%. Wall motion was normal; there were no regional wall motion abnormalities. - Ventricular septum: The contour showed diastolic flattening. - Aortic valve: Trileaflet; mildly thickened, mildly calcified leaflets. - Left atrium: The atrium was mildly dilated. - Right ventricle: The cavity size was moderately dilated. Wall thickness was normal. - Right atrium: The atrium was severely dilated. - Tricuspid valve: There was moderate regurgitation. - Pulmonary arteries: Systolic pressure was moderately to severely increased. PA peak pressure: 68 mm Hg (S).  Patient Profile     85 y.o. female with a hx of coronary artery disease, hx CABG2008, chronic angina, HTN, HLD and spina bifida, last nuc 2016 neg for ischemia.  Now admitted with chest  pain, weakness, hypoxia and confusion also now with a fib. Was in SR on admit.    Assessment & Plan    Pulmonary hypertension, right sided heart failure in combination with respiratory failure.   -On IV lasix 80 BID, changing to oral furosemide 80 mg daily today. Monitor UOP -net negative at least 6 L (plus additional output), weight from 91 kg on admission to 84.6 kg today.  -Cr 1.5 today, 1.82 on admission. Has history of CKD stage 2-3 -K 3.6, was  3.1 and then 2.7 initially -echo with normal EF and no RWMA, mild focal basal hypertrophy of the septum., LA mildly dilated and RA severely dilated, Mod TR and PA pk pressure 68 mmHg. This is consistent with pulmonary hypertension with likely resultant right sided heart failure. -discussed extensively with family. Her lungs and respiratory status are likely the primary drivers in this, with resultant pulmonary hypertension. From a cardiac standpoint, plan is for diuretics to avoid volume overload. They are working on how to get her home, focused on comfort  CAD with CABG in 2008 in [place] 2 Vessel, low risk nuc 2016.   --pk troponin 0.17 now back 0.11.  --echo with no RWMA --continue statin and BB. No ASA given apixaban -given plans for focus on comfort, would likely not perform outpatient stress test as she cannot exercise, respiratory status tenuous for lexiscan, and Cr borderline elevated for CTA. As family plans to focus on comfort, and she is not having any chest pain, cath would not be recommended.  New PAF/A flutter -paroxysmal, likely driven by her respiratory status -CHA2DS2VASc of 6; On apixaban 2.5 mg BID given age and renal function -in SR today  HTN  BP has been somewhat labile but predominantly low. Holding home amlodipine, losartan-HCTZ  [facility] will sign off in anticipation of nearing discharge.   Medication Recommendations:  -Continue to hold amlodipine, losartan-HCTZ -no aspirin given start of apixaban -continue  rosuvastatin and metoprolol -continue furosemide, likely will need 80 mg oral daily with additional weight based dosing PRN -send home with potassium and magnesium supplementation  Other recommendations (labs, testing, etc):  BMET in 1 week Follow up as an outpatient:  We will arrange follow up with Dr. Oval Linsey  For questions or updates, please contact [place] Please consult [email or web address] for contact info under     Signed, Buford Dresser, MD  09/18/2018, 9:29 AM

## 2018-09-18 NOTE — Progress Notes (Signed)
Confirmed with Dr. Domingo Cocking that the family wanted to proceed with comfort care. Comfort screen was started on the monitor. The family expressed that they did not want to use any more oxygen than the HFNC. I educated the family on the pts decreased O2 sats and increased WOB. Gave a 0.5mg  dilaudid bolus for increased comfort.

## 2018-09-19 LAB — CULTURE, BLOOD (ROUTINE X 2)
CULTURE: NO GROWTH
Culture: NO GROWTH
Special Requests: ADEQUATE

## 2018-09-19 MED ORDER — HYDROMORPHONE BOLUS VIA INFUSION
0.5000 mg | INTRAVENOUS | Status: DC | PRN
  Administered 2018-09-19: 0.5 mg via INTRAVENOUS
  Administered 2018-09-19 (×2): 1 mg via INTRAVENOUS
  Administered 2018-09-19: 0.5 mg via INTRAVENOUS
  Administered 2018-09-19 (×2): 1 mg via INTRAVENOUS
  Administered 2018-09-19: 0.5 mg via INTRAVENOUS
  Filled 2018-09-19: qty 1

## 2018-09-19 MED ORDER — LORAZEPAM 2 MG/ML IJ SOLN
1.0000 mg | INTRAMUSCULAR | Status: DC
  Administered 2018-09-19: 1 mg via INTRAVENOUS
  Filled 2018-09-19: qty 1

## 2018-10-07 ENCOUNTER — Ambulatory Visit: Payer: Medicare Other | Admitting: Cardiovascular Disease

## 2018-10-16 NOTE — Progress Notes (Signed)
Daily Progress Note   Patient Name: Vanessa Nielsen       Date: 09/24/18 DOB: [birth year]  Age: 85 y.o. MRN#: [id number] Attending Physician: No att. providers found Primary Care Physician: Velna Hatchet, MD Admit Date: 08/16/2018  Reason for Consultation/Follow-up: Establishing goals of care, Non pain symptom management and Pain control  Subjective: Saw and examined patient.  She is somnolent and does not respond.  Discussed with husband and son at the bedside.  Length of Stay: 4  Current Medications: Scheduled Meds:  . furosemide  40 mg Intravenous Daily  . LORazepam  1 mg Intravenous Q4H  . mouth rinse  15 mL Mouth Rinse BID  . sodium chloride flush  3 mL Intravenous Q12H  . sodium chloride flush  3 mL Intravenous Q12H    Continuous Infusions: . sodium chloride Stopped (09/17/18 1155)  . sodium chloride    . HYDROmorphone 1.5 mg/hr (09-24-18 1642)    PRN Meds: sodium chloride, sodium chloride, acetaminophen **OR** acetaminophen, antiseptic oral rinse, diphenhydrAMINE, glycopyrrolate **OR** glycopyrrolate **OR** glycopyrrolate, haloperidol **OR** haloperidol **OR** haloperidol lactate, HYDROmorphone, nitroGLYCERIN, ondansetron **OR** ondansetron (ZOFRAN) IV, polyvinyl alcohol, sodium chloride flush, sodium chloride flush  Physical Exam         General: Somnolent. Heart: Regular rate and rhythm. No murmur appreciated. Lungs: Decreased air movement, + rhonchi Abdomen: Soft, nontender, nondistended, positive bowel sounds.  Ext: + edema Skin: Warm and dry  Vital Signs: BP 104/64 (BP Location: Left Leg)   Pulse (!) 136   Temp 99.3 F (37.4 C) (Oral)   Resp (!) 27   Ht 5\' 1"  (1.549 m)   Wt 84.6 kg   SpO2 (!) 89%   BMI 35.24 kg/m  SpO2: SpO2: (!) 89 % O2 Device:  O2 Device: Room Air O2 Flow Rate: O2 Flow Rate (L/min): 5 L/min  Intake/output summary:   Intake/Output Summary (Last 24 hours) at 09/24/18 2235 Last data filed at 09/24/18 1642 Gross per 24 hour  Intake 33.37 ml  Output 500 ml  Net -466.63 ml   LBM: Last BM Date: 09/18/17 Baseline Weight: Weight: 91 kg Most recent weight: Weight: 84.6 kg       Palliative Assessment/Data:    Flowsheet Rows     Most Recent Value  Intake Tab  Referral Department  Hospitalist  Unit at Time  of Referral  ICU  Palliative Care Primary Diagnosis  Pulmonary  Date Notified  09/17/18  Palliative Care Type  New Palliative care  Reason for referral  Clarify Goals of Care  Date of Admission  09/15/18  Date first seen by Palliative Care  09/18/18  # of days Palliative referral response time  1 Day(s)  # of days IP prior to Palliative referral  2  Clinical Assessment  Palliative Performance Scale Score  20%  Pain Max last 24 hours  Not able to report  Pain Min Last 24 hours  Not able to report  Dyspnea Max Last 24 Hours  Not able to report  Dyspnea Min Last 24 hours  Not able to report  Psychosocial & Spiritual Assessment  Palliative Care Outcomes  Patient/Family meeting held?  Yes  Who was at the meeting?  Husband, Son, Daughter, SIL, DIL  Palliative Care Outcomes  Improved pain interventions, Improved non-pain symptom therapy, Clarified goals of care, Changed to focus on comfort      Patient Active Problem List   Diagnosis Date Noted  . Respiratory failure with hypoxia ([place]) 08/22/2018  . Heart failure, acute diastolic ([place]) 50/35/4656  . Respiratory failure, acute ([place]) 09/06/2018  . Heart failure ([place]) 08/27/2018  . Hypokalemia 12/21/2013  . Emesis, persistent 12/21/2013  . Cushing syndrome ([place])   . Allergic rhinitis   . Ischemic heart disease 02/25/2011  . Benign hypertensive heart disease without heart failure 02/25/2011  . Hypercholesterolemia 02/25/2011  . Spina bifida of lumbar  spine ([place]) 02/25/2011  . Irritable bowel syndrome (IBS) 02/25/2011    Palliative Care Assessment & Plan   Patient Profile: 85 y.o. female  with past medical history of spina bifida, hypertension, coronary disease status post CABG, admitted on 08/19/2018 with acute hypoxic respiratory failure with CHF exacerbation.  Since that time, her respiratory status is continued to decline despite diuresis.  It is felt that respiratory decline is due to underlying nontreated sleep apnea, obesity hypoventilation syndrome, and chronic deconditioning.  Palliative consulted for goals of care  Recommendations/Plan:  Full comfort care.  Medication usage reviewed and agree with dilaudid 0.6mg  basal with continued boluses as needed.  Continue to titrate off supplemental oxygen  Goals of Care and Additional Recommendations:  Limitations on Scope of Treatment: Full Comfort Care  Code Status:    Code Status Orders  (From admission, onward)         Start     Ordered   09/18/18 1017  Do not attempt resuscitation (DNR)  Continuous    Question Answer Comment  In the event of cardiac or respiratory ARREST Do not call a "code blue"   In the event of cardiac or respiratory ARREST Do not perform Intubation, CPR, defibrillation or ACLS   In the event of cardiac or respiratory ARREST Use medication by any route, position, wound care, and other measures to relive pain and suffering. May use oxygen, suction and manual treatment of airway obstruction as needed for comfort.      09/18/18 1017        Code Status History    Date Active Date Inactive Code Status Order ID Comments User Context   08/18/2018 2224 09/18/2018 1017 DNR [id number]  Elmarie Shiley, MD Inpatient   12/21/2013 1554 12/22/2013 1733 Full Code [id number]  Velna Hatchet, MD Inpatient       Prognosis:   Hours - Days  Discharge Planning:  Anticipated Hospital Death  Care plan was discussed with RN  Thank you for allowing the  Palliative Medicine Team to assist in the care of this patient.   Total Time 20 Prolonged Time Billed No      Greater than 50%  of this time was spent counseling and coordinating care related to the above assessment and plan.  Micheline Rough, MD  Please contact Palliative Medicine Team phone at [phone] for questions and concerns.

## 2018-10-16 NOTE — Progress Notes (Signed)
PROGRESS NOTE    ANAYS DETORE  OYD:[id number] DOB: 11/08/1933 DOA: 09/06/2018 PCP: Velna Hatchet, MD    Brief Narrative: Vanessa Nielsen is a 85 y.o. female with past medical history significant for spina bifida,, hypertension, coronary artery disease status post CABG 2008, last stress test was 2016 and was negative, patient at baseline is able to take a few steps and transition from bed to wheelchair from wheelchair to her car.  Patient presented with progressive weakness, recurrent fall, worsening shortness of breath and hypoxemia.  Patient has now transition to comfort care. She is on dilaudid gtt. Dr Domingo Cocking will follow up on patient today to further decrease oxygen support. Expect hospital death.   Assessment & Plan:   Principal Problem:   Respiratory failure with hypoxia ([place]) Active Problems:   Spina bifida of lumbar spine (HCC)   Heart failure, acute diastolic (HCC)   Respiratory failure, acute ([place])   Heart failure ([place])  1-Acute hypoxic respiratory failure: In the setting of acute diastolic heart failure exacerbation. Patient presented with hypoxemia, volume overload, elevated BNP. Continue with 80 mg IV twice daily. Weight down to 198. Negative 5 L.  ECHO with increase pulmonary Hypertension, mitral regurgitation: plan per cardiology.  Patient with worsening hypoxemia. She has been on IV lasix, 5 L negative. Place on BIPAP for hypoxemia. CCM consulted for hypoxic respiratory failure. Case discussed with Dr Cristal Ford, hypoxemia could be multifactorial; HF, ? PNA, hypoventilation syndrome.  I spoke with family, plan is to continue with current management, understanding that BIPAP is maximal therapy , patient wouldn't want to be intubated. I have ask Palliative care consult. Meeting will be tomorrow at 9 am, discussed with Dr Domingo Cocking.  Family met with palliative plan is to proceed with comfort measure.  Patient is now comfort care.  Patient report pain and SOB. I  discussed with nurse, patient will received IV dilaudid bolus now and she will increase Gtt   2-Acute on Chronic Renal failure stage II; creatinine per records 0.95. Patient presented with a creatinine level 1.9. Suspect acute renal failure related to cardiorenal syndrome. Monitor on IV Lasix. Cr trending down to 1.6 ---1.4--1.5  3-Generalized weakness; likely related to hypoxemia and volume overload. She has been declining for last months.   4-Initial Concern for sepsis; was rule out ED physician was concerned with sepsis initially.  Patient is afebrile, no leukocytosis. Follow blood cultures.  No growth to date  5-Hypertension; hold Norvasc, Hyzaar for now. Continue with metoprolol.   6-Elevation troponin;  EKG changes. Patient has history of chest pain on and off.  Per last cardiology note she experience for episode of chest pain during the year. Patient is currently chest pain-free. Continue with metoprolol, aspirin, statins. Cardiology consulted  7-Paroxysmal A. Fib; patient converted to A. fib rate control.  Cardiology will also see in consultation. Started on eliquis.   8-Acute encephalopathy; likely related to acute illness, hypoxemia, heart failure exacerbation.  B12 level low normal. Start B 12 supplement.   Fever, chest x ray with infiltrates, atelectasis.  Started ceftriaxone.   Hypomagnesemia; Treated with IV magnesium  Hypokalemia; replete  orally.   Morbid obesity; diet education.   Estimated body mass index is 35.24 kg/m as calculated from the following:   Height as of this encounter: 5' 1" (1.549 m).   Weight as of this encounter: 84.6 kg.   DVT prophylaxis: Lovenox Code Status: DNR Family Communication: Multiple family member at bedside.  Disposition Plan: plan is for comfort care.  Consultants:   Cardiology   Procedures:  Echo   Antimicrobials:   None   Subjective: She feels SOB, and report pain. Doesn't feel well. Nurse will  give IV dilaudid bolus and increase rate of dilaudid Gtt  Objective: Vitals:   Oct 01, 2018 0000 10-01-18 0400 2018-10-01 0500 10-01-18 0600  BP:      Pulse: (!) 52 (!) 103 (!) 105 (!) 112  Resp: (!) 31 (!) 31 (!) 31 (!) 24  Temp:      TempSrc:      SpO2: 93% 93% 93% 93%  Weight:      Height:        Intake/Output Summary (Last 24 hours) at 10/01/2018 0812 Last data filed at 10-01-18 0600 Gross per 24 hour  Intake 11.04 ml  Output 1500 ml  Net -1488.96 ml   Filed Weights   09/15/18 0500 09/17/18 0608 09/18/18 0500  Weight: 90.1 kg 90.2 kg 84.6 kg    Examination:  General exam: sleepy, HFO Respiratory system: Decreased breath sounds.  Cardiovascular system: S 1, S 2 IRR Gastrointestinal system: BS present, soft, nt Central nervous system: sleepy  Extremities: plus 1 edema  Data Reviewed: I have personally reviewed following labs and imaging studies  CBC: Recent Labs  Lab 08/21/2018 1159 09/15/18 0053 09/16/18 0317 09/17/18 0317  WBC 9.2 11.3* 8.5 9.6  NEUTROABS 6.8  --   --   --   HGB 13.0 12.6 13.3 13.4  HCT 43.3 41.4 43.5 45.3  MCV 92.1 91.8 88.8 91.5  PLT 312 283 293 482   Basic Metabolic Panel: Recent Labs  Lab 09/15/18 0053 09/15/18 1002 09/16/18 0317 09/16/18 1302 09/17/18 0317 09/18/18 0313  NA 142  --  141 139 135 139  K 3.1*  --  2.7* 2.7* 3.9 3.6  CL 98  --  88* 86* 89* 89*  CO2 31  --  36* 41* 34* 37*  GLUCOSE 81  --  120* 174* 129* 137*  BUN 28*  --  24* 25* 20 23  CREATININE 1.82*  --  1.63* 1.57* 1.42* 1.50*  CALCIUM 8.0*  --  8.0* 7.8* 7.5* 7.7*  MG  --  1.3* 1.5*  --  2.0  --    GFR: Estimated Creatinine Clearance: 28.5 mL/min (A) (by C-G formula based on SCr of 1.5 mg/dL (H)). Liver Function Tests: Recent Labs  Lab 09/10/2018 1159  AST 47*  ALT 44  ALKPHOS 80  BILITOT 0.8  PROT 5.4*  ALBUMIN 3.1*   No results for input(s): LIPASE, AMYLASE in the last 168 hours. No results for input(s): AMMONIA in the last 168 hours. Coagulation  Profile: No results for input(s): INR, PROTIME in the last 168 hours. Cardiac Enzymes: Recent Labs  Lab 08/17/2018 1159 09/13/2018 1853 09/15/18 0053 09/15/18 0654  TROPONINI 0.17* 0.15* 0.14* 0.11*   BNP (last 3 results) No results for input(s): PROBNP in the last 8760 hours. HbA1C: No results for input(s): HGBA1C in the last 72 hours. CBG: Recent Labs  Lab 09/16/18 1219  GLUCAP 183*   Lipid Profile: No results for input(s): CHOL, HDL, LDLCALC, TRIG, CHOLHDL, LDLDIRECT in the last 72 hours. Thyroid Function Tests: No results for input(s): TSH, T4TOTAL, FREET4, T3FREE, THYROIDAB in the last 72 hours. Anemia Panel: No results for input(s): VITAMINB12, FOLATE, FERRITIN, TIBC, IRON, RETICCTPCT in the last 72 hours. Sepsis Labs: Recent Labs  Lab 09/01/2018 1201 08/25/2018 1457  LATICACIDVEN 1.03 1.11    Recent Results (from the past 240 hour(s))  Blood Culture (routine x 2)     Status: None   Collection Time: 09/13/2018 12:03 PM  Result Value Ref Range Status   Specimen Description BLOOD BLOOD RIGHT FOREARM  Final   Special Requests   Final    BOTTLES DRAWN AEROBIC AND ANAEROBIC Blood Culture adequate volume Performed at [facility] [address], [place], [place] [address]    Culture NO GROWTH 5 DAYS  Final   Report Status 10/17/18 FINAL  Final  Blood Culture (routine x 2)     Status: None   Collection Time: 09/13/2018 12:03 PM  Result Value Ref Range Status   Specimen Description BLOOD LEFT ANTECUBITAL  Final   Special Requests   Final    BOTTLES DRAWN AEROBIC AND ANAEROBIC Blood Culture results may not be optimal due to an excessive volume of blood received in culture bottles Performed at [facility], [place] [address] [place], [place] [address]    Culture NO GROWTH 5 DAYS  Final   Report Status 10-17-18 FINAL  Final  MRSA PCR Screening     Status: None   Collection Time: 08/31/2018  9:34 PM  Result Value Ref Range Status   MRSA  by PCR NEGATIVE NEGATIVE Final    Comment:        The GeneXpert MRSA Assay (FDA approved for NASAL specimens only), is one component of a comprehensive MRSA colonization surveillance program. It is not intended to diagnose MRSA infection nor to guide or monitor treatment for MRSA infections. Performed at [facility], [place] [address], [place], [place] [address]          Radiology Studies: Dg Chest Port 1 View  Result Date: 09/18/2018 CLINICAL DATA:  Initial evaluation for acute respiratory failure. EXAM: PORTABLE CHEST 1 VIEW COMPARISON:  Prior radiograph from 09/17/2018. FINDINGS: Cardiomegaly, grossly stable from previous. Mediastinal silhouette within normal limits. Extensive aortic atherosclerosis. Lungs are hypoinflated. Interval improvement in pulmonary interstitial congestion/edema as compared to previous exam. Patchy bibasilar opacities persist but are also improved, which could reflect atelectasis or possibly infiltrates. No appreciable pleural effusion. No pneumothorax. Osseous structures are unchanged. IMPRESSION: 1. Persistent but improved bibasilar opacities, which could reflect atelectasis or possibly infiltrates. 2. Stable cardiomegaly with interval improvement in underlying pulmonary interstitial congestion/edema. Electronically Signed   By: Jeannine Boga M.D.   On: 09/18/2018 06:06        Scheduled Meds: . furosemide  40 mg Intravenous Daily  . mouth rinse  15 mL Mouth Rinse BID  . sodium chloride flush  3 mL Intravenous Q12H  . sodium chloride flush  3 mL Intravenous Q12H   Continuous Infusions: . sodium chloride Stopped (09/17/18 1155)  . sodium chloride    . HYDROmorphone 0.3 mg/hr (2018-10-17 0600)     LOS: 4 days    Time spent: 35 minutes.     Elmarie Shiley, MD [facility] Pager [phone]  If 7PM-7AM, please contact night-coverage [email or web address] Password [email or web address] 10-17-18, 8:12 AM

## 2018-10-16 NOTE — Progress Notes (Signed)
Pt's daughter and daughter-in-law were bedside when I arrived. Pt's daughter expressed the need for prayer and singing for support but would like to wait until other family members arrive. I offered empathic listening and support and told them I would return shortly after other family members arrive. [place], [place]   2018-10-13 1500  Clinical Encounter Type  Visited With Family

## 2018-10-16 NOTE — Progress Notes (Signed)
RN called to let me know of more groaning, vocalization.  I saw and examined patient again.  She is tachycardic with increasing periods of apena.  She is twitching.  Has episodic groaning following apenic periods.  Reviewed prn usage.  Plan to increase continuous rate to 1.5mg .  She uses valium on a regular basis at home.  Will schedule ativan 1 Q 4 hours.  Micheline Rough, MD [facility] [phone]

## 2018-10-16 NOTE — Discharge Summary (Signed)
Death Summary  Vanessa Nielsen UKG:[id number] DOB: 1934-04-09 DOA: September 16, 2018  PCP: Velna Hatchet, MD PCP/Office notified:   Admit date: 2018-09-16 Date of Death: September 21, 2018.  Final Diagnoses:  Principal Problem:   Respiratory failure with hypoxia ([place]) Active Problems:   Spina bifida of lumbar spine ([place])   Heart failure, acute diastolic ([facility])   Respiratory failure, acute ([place])   Heart failure ([place])    History of present illness:  Vanessa Gulden Crawfordis a 85 y.o.femalewith past medical history significant for spina bifida,, hypertension, coronary artery disease status post CABG 2008,last stress test was 2016and was negative,patient at baseline is able to take a few steps and transition from bed to wheelchair from wheelchair to her car.  Patient presented with progressive weakness, recurrent fall, worsening shortness of breath and hypoxemia.  Patient has now transition to comfort care. She is on dilaudid gtt. Dr Domingo Cocking will follow up on patient today to further decrease oxygen support. Expect hospital death.   Patient despite aggressive treatment of heart failure and respiratory failure continue to decline and required high amount of oxygen. Her hypoxemia was thought to be multifactorial; related to heart failure, questionable PNA, hypoventilation obesity syndrome. Pulmonology also evaluated patient. Family met with palliative care and decision was made to proceed with comfort measure only.   Hospital Course:  1-Acute hypoxic respiratory failure: In the setting of acute diastolic heart failure exacerbation. Patient presented with hypoxemia, volume overload, elevated BNP. Continue with 80 mg IV twice daily. Weight down to 198. Negative 5 L.  ECHO with increase pulmonary Hypertension, mitral regurgitation: plan per cardiology.  Patient with worsening hypoxemia. She has been on IV lasix, 5 L negative. Place on BIPAP for hypoxemia. CCM consulted for hypoxic respiratory failure.  Case discussed with Dr Cristal Ford, hypoxemia could be multifactorial; HF, ? PNA, hypoventilation syndrome.  I spoke with family, plan is to continue with current management, understanding that BIPAP is maximal therapy , patient wouldn't want to be intubated. I have ask Palliative care consult. Meeting will be tomorrow at 9 am, discussed with Dr Domingo Cocking.  Family met with palliative plan is to proceed with comfort measure.  Patient is now comfort care.  Patient report pain and SOB. I discussed with nurse, patient will received IV dilaudid bolus now and she will increase Gtt   2-Acute on Chronic Renal failure stage II;creatinine per records 0.95. Patient presented with a creatinine level 1.9. Suspect acute renal failure related to cardiorenal syndrome. Monitor on IV Lasix. Cr trending down to 1.6 ---1.4--1.5  3-Generalized weakness;likely related to hypoxemia and volume overload. She has been declining for last months.   4-Initial Concern for sepsis; was rule out ED physician was concerned with sepsis initially. Patient is afebrile, no leukocytosis. Follow blood cultures.  No growth to date  5-Hypertension;hold Norvasc, Hyzaar for now. Continue with metoprolol.  6-Elevation troponin;EKG changes. Patient has history of chest pain on and off. Per last cardiology note she experience for episode of chest pain during the year. Patient is currently chest pain-free. Continue with metoprolol, aspirin, statins. Cardiology consulted and help with patient care.   7-Paroxysmal A. Fib; patient converted to A. fib rate control.  Cardiology will also see in consultation. Started on eliquis.   8-Acute encephalopathy; likely related to acute illness, hypoxemia, heart failure exacerbation.  B12 level low normal. Start B 12 supplement.   ? PNA; Fever, chest x ray with infiltrates, atelectasis.  Started ceftriaxone.   Hypomagnesemia; Treated with IV magnesium  Hypokalemia; replete  orally.   Morbid obesity; diet education.     Time: 19;29  Signed:  Nattaly Yebra A Gus Littler  [facility] 10/01/2018, 3:23 PM

## 2018-10-16 NOTE — Progress Notes (Signed)
Pt's family, including husband, children, and grandchildren were bedside when I arrived. Pt was in bed and moaned a couple times during our visit. The family and I sang a song led by pt's three year old great granddaughter. I had prayer w/family after singing for which they were very grateful.  Please call if additional support is needed. [place], [place]   2018-09-22 1544  Clinical Encounter Type  Visited With Patient and family together

## 2018-10-16 NOTE — Death Summary Note (Signed)
Patient expired at [facility]. 2 RN's verified no heart and lung sounds ausculted. Family at bedside. NP notified. See [place] signed death certificate.

## 2018-10-16 DEATH — deceased
